# Patient Record
Sex: Female | Born: 1955 | Race: White | Hispanic: No | Marital: Married | Smoking: Never smoker
Health system: Southern US, Community
[De-identification: ages and names within clinical notes are randomized; demographics above are authoritative.]

## PROBLEM LIST (undated history)

## (undated) DIAGNOSIS — C5701 Malignant neoplasm of right fallopian tube: Secondary | ICD-10-CM

## (undated) DIAGNOSIS — M549 Dorsalgia, unspecified: Secondary | ICD-10-CM

## (undated) DIAGNOSIS — M2550 Pain in unspecified joint: Secondary | ICD-10-CM

## (undated) DIAGNOSIS — I10 Essential (primary) hypertension: Secondary | ICD-10-CM

## (undated) DIAGNOSIS — R600 Localized edema: Secondary | ICD-10-CM

## (undated) HISTORY — DX: Essential (primary) hypertension: I10

## (undated) HISTORY — DX: Malignant neoplasm of right fallopian tube: C57.01

## (undated) HISTORY — DX: Localized edema: R60.0

## (undated) HISTORY — DX: Pain in unspecified joint: M25.50

## (undated) HISTORY — PX: ORTHOPEDIC SURGERY: SHX850

## (undated) HISTORY — DX: Dorsalgia, unspecified: M54.9

---

## 1999-03-17 ENCOUNTER — Other Ambulatory Visit: Admission: RE | Admit: 1999-03-17 | Discharge: 1999-03-17 | Payer: Self-pay | Admitting: Obstetrics and Gynecology

## 2000-06-29 ENCOUNTER — Encounter: Payer: Self-pay | Admitting: Preventative Medicine

## 2000-06-29 ENCOUNTER — Ambulatory Visit (HOSPITAL_COMMUNITY): Admission: RE | Admit: 2000-06-29 | Discharge: 2000-06-29 | Payer: Self-pay | Admitting: Preventative Medicine

## 2000-11-08 ENCOUNTER — Other Ambulatory Visit: Admission: RE | Admit: 2000-11-08 | Discharge: 2000-11-08 | Payer: Self-pay | Admitting: Obstetrics and Gynecology

## 2003-03-07 ENCOUNTER — Ambulatory Visit (HOSPITAL_BASED_OUTPATIENT_CLINIC_OR_DEPARTMENT_OTHER): Admission: RE | Admit: 2003-03-07 | Discharge: 2003-03-07 | Payer: Self-pay | Admitting: Orthopedic Surgery

## 2003-03-19 ENCOUNTER — Encounter (INDEPENDENT_AMBULATORY_CARE_PROVIDER_SITE_OTHER): Payer: Self-pay | Admitting: Specialist

## 2003-03-19 ENCOUNTER — Ambulatory Visit (HOSPITAL_COMMUNITY): Admission: RE | Admit: 2003-03-19 | Discharge: 2003-03-19 | Payer: Self-pay | Admitting: Gastroenterology

## 2003-04-06 ENCOUNTER — Encounter: Payer: Self-pay | Admitting: Gastroenterology

## 2003-04-06 ENCOUNTER — Encounter: Admission: RE | Admit: 2003-04-06 | Discharge: 2003-04-06 | Payer: Self-pay | Admitting: Gastroenterology

## 2004-02-11 ENCOUNTER — Ambulatory Visit (HOSPITAL_COMMUNITY): Admission: RE | Admit: 2004-02-11 | Discharge: 2004-02-11 | Payer: Self-pay | Admitting: Family Medicine

## 2004-06-17 ENCOUNTER — Ambulatory Visit (HOSPITAL_COMMUNITY): Admission: RE | Admit: 2004-06-17 | Discharge: 2004-06-17 | Payer: Self-pay | Admitting: Obstetrics and Gynecology

## 2004-06-17 ENCOUNTER — Encounter (INDEPENDENT_AMBULATORY_CARE_PROVIDER_SITE_OTHER): Payer: Self-pay | Admitting: Specialist

## 2004-07-01 ENCOUNTER — Ambulatory Visit: Admission: RE | Admit: 2004-07-01 | Discharge: 2004-07-01 | Payer: Self-pay | Admitting: Gynecologic Oncology

## 2004-07-15 ENCOUNTER — Encounter (INDEPENDENT_AMBULATORY_CARE_PROVIDER_SITE_OTHER): Payer: Self-pay | Admitting: Specialist

## 2004-07-15 ENCOUNTER — Encounter (INDEPENDENT_AMBULATORY_CARE_PROVIDER_SITE_OTHER): Payer: Self-pay | Admitting: *Deleted

## 2004-07-15 ENCOUNTER — Inpatient Hospital Stay (HOSPITAL_COMMUNITY): Admission: RE | Admit: 2004-07-15 | Discharge: 2004-07-18 | Payer: Self-pay | Admitting: Obstetrics and Gynecology

## 2004-07-15 DIAGNOSIS — C5701 Malignant neoplasm of right fallopian tube: Secondary | ICD-10-CM

## 2004-07-15 HISTORY — PX: ABDOMINAL HYSTERECTOMY: SHX81

## 2004-07-15 HISTORY — DX: Malignant neoplasm of right fallopian tube: C57.01

## 2004-07-17 ENCOUNTER — Ambulatory Visit: Payer: Self-pay | Admitting: Oncology

## 2004-07-29 ENCOUNTER — Ambulatory Visit: Admission: RE | Admit: 2004-07-29 | Discharge: 2004-07-29 | Payer: Self-pay | Admitting: Gynecology

## 2004-08-01 ENCOUNTER — Inpatient Hospital Stay (HOSPITAL_COMMUNITY): Admission: AD | Admit: 2004-08-01 | Discharge: 2004-08-01 | Payer: Self-pay | Admitting: Obstetrics & Gynecology

## 2004-08-05 ENCOUNTER — Ambulatory Visit: Admission: RE | Admit: 2004-08-05 | Discharge: 2004-08-05 | Payer: Self-pay | Admitting: Gynecologic Oncology

## 2004-08-07 ENCOUNTER — Encounter (INDEPENDENT_AMBULATORY_CARE_PROVIDER_SITE_OTHER): Payer: Self-pay | Admitting: *Deleted

## 2004-08-07 ENCOUNTER — Ambulatory Visit (HOSPITAL_COMMUNITY): Admission: RE | Admit: 2004-08-07 | Discharge: 2004-08-07 | Payer: Self-pay | Admitting: Gynecology

## 2004-08-08 ENCOUNTER — Ambulatory Visit (HOSPITAL_COMMUNITY): Admission: RE | Admit: 2004-08-08 | Discharge: 2004-08-08 | Payer: Self-pay | Admitting: Gynecologic Oncology

## 2004-08-11 ENCOUNTER — Ambulatory Visit (HOSPITAL_COMMUNITY): Admission: RE | Admit: 2004-08-11 | Discharge: 2004-08-11 | Payer: Self-pay | Admitting: Gynecologic Oncology

## 2004-08-29 ENCOUNTER — Ambulatory Visit (HOSPITAL_COMMUNITY): Admission: RE | Admit: 2004-08-29 | Discharge: 2004-08-30 | Payer: Self-pay | Admitting: Oncology

## 2004-08-29 ENCOUNTER — Ambulatory Visit: Payer: Self-pay | Admitting: Oncology

## 2004-09-02 ENCOUNTER — Ambulatory Visit: Payer: Self-pay | Admitting: Oncology

## 2004-10-20 ENCOUNTER — Ambulatory Visit: Payer: Self-pay | Admitting: Oncology

## 2004-12-16 ENCOUNTER — Ambulatory Visit: Payer: Self-pay | Admitting: Oncology

## 2005-01-08 ENCOUNTER — Encounter: Admission: RE | Admit: 2005-01-08 | Discharge: 2005-01-08 | Payer: Self-pay | Admitting: Oncology

## 2005-01-13 ENCOUNTER — Ambulatory Visit: Admission: RE | Admit: 2005-01-13 | Discharge: 2005-01-13 | Payer: Self-pay | Admitting: Gynecologic Oncology

## 2005-01-15 ENCOUNTER — Ambulatory Visit (HOSPITAL_COMMUNITY): Admission: RE | Admit: 2005-01-15 | Discharge: 2005-01-15 | Payer: Self-pay | Admitting: Gynecologic Oncology

## 2005-03-24 ENCOUNTER — Ambulatory Visit (HOSPITAL_COMMUNITY): Admission: RE | Admit: 2005-03-24 | Discharge: 2005-03-24 | Payer: Self-pay | Admitting: Oncology

## 2005-03-24 ENCOUNTER — Ambulatory Visit: Payer: Self-pay | Admitting: Oncology

## 2005-03-31 ENCOUNTER — Ambulatory Visit: Admission: RE | Admit: 2005-03-31 | Discharge: 2005-03-31 | Payer: Self-pay | Admitting: Gynecologic Oncology

## 2005-07-14 ENCOUNTER — Ambulatory Visit: Payer: Self-pay | Admitting: Oncology

## 2005-10-23 ENCOUNTER — Ambulatory Visit: Payer: Self-pay | Admitting: Oncology

## 2005-11-13 ENCOUNTER — Ambulatory Visit: Admission: RE | Admit: 2005-11-13 | Discharge: 2005-11-13 | Payer: Self-pay | Admitting: Gynecology

## 2006-01-18 ENCOUNTER — Ambulatory Visit: Payer: Self-pay | Admitting: Oncology

## 2006-01-18 LAB — CBC WITH DIFFERENTIAL/PLATELET
Eosinophils Absolute: 0.1 10*3/uL (ref 0.0–0.5)
HCT: 39.7 % (ref 34.8–46.6)
LYMPH%: 28.6 % (ref 14.0–48.0)
MONO#: 0.4 10*3/uL (ref 0.1–0.9)
NEUT#: 2.8 10*3/uL (ref 1.5–6.5)
NEUT%: 61.2 % (ref 39.6–76.8)
Platelets: 222 10*3/uL (ref 145–400)
WBC: 4.6 10*3/uL (ref 3.9–10.0)

## 2006-01-18 LAB — COMPREHENSIVE METABOLIC PANEL
ALT: 32 U/L (ref 0–40)
BUN: 14 mg/dL (ref 6–23)
CO2: 27 mEq/L (ref 19–32)
Calcium: 9.5 mg/dL (ref 8.4–10.5)
Chloride: 105 mEq/L (ref 96–112)
Creatinine, Ser: 0.7 mg/dL (ref 0.4–1.2)

## 2006-01-18 LAB — LIPID PANEL
Cholesterol: 157 mg/dL (ref 0–200)
HDL: 39 mg/dL — ABNORMAL LOW (ref >39)
Total CHOL/HDL Ratio: 4 Ratio

## 2006-04-07 ENCOUNTER — Ambulatory Visit: Payer: Self-pay | Admitting: Oncology

## 2006-04-13 ENCOUNTER — Ambulatory Visit: Admission: RE | Admit: 2006-04-13 | Discharge: 2006-04-13 | Payer: Self-pay | Admitting: Gynecologic Oncology

## 2006-07-30 ENCOUNTER — Ambulatory Visit: Payer: Self-pay | Admitting: Oncology

## 2006-08-03 LAB — COMPREHENSIVE METABOLIC PANEL
AST: 14 U/L (ref 0–37)
Albumin: 4.6 g/dL (ref 3.5–5.2)
Alkaline Phosphatase: 104 U/L (ref 39–117)
BUN: 15 mg/dL (ref 6–23)
Glucose, Bld: 99 mg/dL (ref 70–99)
Potassium: 3.8 mEq/L (ref 3.5–5.3)
Sodium: 142 mEq/L (ref 135–145)
Total Bilirubin: 0.7 mg/dL (ref 0.3–1.2)
Total Protein: 7.5 g/dL (ref 6.0–8.3)

## 2006-08-03 LAB — CBC WITH DIFFERENTIAL/PLATELET
EOS%: 0.8 % (ref 0.0–7.0)
LYMPH%: 20 % (ref 14.0–48.0)
MCH: 30.8 pg (ref 26.0–34.0)
MCV: 88.4 fL (ref 81.0–101.0)
MONO%: 6.2 % (ref 0.0–13.0)
Platelets: 266 10*3/uL (ref 145–400)
RBC: 4.63 10*6/uL (ref 3.70–5.32)
RDW: 12.1 % (ref 11.3–14.5)

## 2006-11-22 ENCOUNTER — Ambulatory Visit: Payer: Self-pay | Admitting: Oncology

## 2006-11-24 LAB — COMPREHENSIVE METABOLIC PANEL
AST: 15 U/L (ref 0–37)
Albumin: 4.3 g/dL (ref 3.5–5.2)
Alkaline Phosphatase: 100 U/L (ref 39–117)
CO2: 30 mEq/L (ref 19–32)
Calcium: 9.4 mg/dL (ref 8.4–10.5)
Total Protein: 7 g/dL (ref 6.0–8.3)

## 2006-11-24 LAB — CBC WITH DIFFERENTIAL/PLATELET
BASO%: 0.5 % (ref 0.0–2.0)
Basophils Absolute: 0 10*3/uL (ref 0.0–0.1)
HCT: 38.4 % (ref 34.8–46.6)
LYMPH%: 22.6 % (ref 14.0–48.0)
MCH: 30.5 pg (ref 26.0–34.0)
MCHC: 34.9 g/dL (ref 32.0–36.0)
MONO#: 0.5 10*3/uL (ref 0.1–0.9)
NEUT%: 68.2 % (ref 39.6–76.8)
Platelets: 256 10*3/uL (ref 145–400)
WBC: 6.3 10*3/uL (ref 3.9–10.0)

## 2006-12-01 ENCOUNTER — Encounter: Admission: RE | Admit: 2006-12-01 | Discharge: 2006-12-01 | Payer: Self-pay | Admitting: Oncology

## 2006-12-21 ENCOUNTER — Ambulatory Visit: Admission: RE | Admit: 2006-12-21 | Discharge: 2006-12-21 | Payer: Self-pay | Admitting: Gynecologic Oncology

## 2007-04-08 ENCOUNTER — Ambulatory Visit: Payer: Self-pay | Admitting: Oncology

## 2007-04-12 LAB — COMPREHENSIVE METABOLIC PANEL
ALT: 13 U/L (ref 0–35)
AST: 16 U/L (ref 0–37)
Alkaline Phosphatase: 97 U/L (ref 39–117)
Glucose, Bld: 85 mg/dL (ref 70–99)
Sodium: 138 mEq/L (ref 135–145)
Total Bilirubin: 0.5 mg/dL (ref 0.3–1.2)
Total Protein: 7.1 g/dL (ref 6.0–8.3)

## 2007-04-12 LAB — CBC WITH DIFFERENTIAL/PLATELET
BASO%: 0.5 % (ref 0.0–2.0)
EOS%: 1.2 % (ref 0.0–7.0)
LYMPH%: 24 % (ref 14.0–48.0)
MCHC: 35.2 g/dL (ref 32.0–36.0)
MCV: 86.3 fL (ref 81.0–101.0)
MONO%: 7.5 % (ref 0.0–13.0)
Platelets: 245 10*3/uL (ref 145–400)
RBC: 4.61 10*6/uL (ref 3.70–5.32)

## 2007-06-30 ENCOUNTER — Ambulatory Visit: Payer: Self-pay | Admitting: Oncology

## 2007-07-04 LAB — CA 125: CA 125: 14.2 U/mL (ref 0.0–30.2)

## 2007-07-19 ENCOUNTER — Ambulatory Visit: Admission: RE | Admit: 2007-07-19 | Discharge: 2007-07-19 | Payer: Self-pay | Admitting: Gynecologic Oncology

## 2008-01-09 ENCOUNTER — Ambulatory Visit: Payer: Self-pay | Admitting: Oncology

## 2008-01-11 LAB — COMPREHENSIVE METABOLIC PANEL
ALT: 14 U/L (ref 0–35)
AST: 17 U/L (ref 0–37)
Albumin: 4.2 g/dL (ref 3.5–5.2)
Alkaline Phosphatase: 102 U/L (ref 39–117)
BUN: 14 mg/dL (ref 6–23)
Calcium: 9.3 mg/dL (ref 8.4–10.5)
Chloride: 104 mEq/L (ref 96–112)
Potassium: 3.9 mEq/L (ref 3.5–5.3)
Sodium: 140 mEq/L (ref 135–145)
Total Protein: 7.2 g/dL (ref 6.0–8.3)

## 2008-01-11 LAB — CBC WITH DIFFERENTIAL/PLATELET
BASO%: 1.4 % (ref 0.0–2.0)
Basophils Absolute: 0.1 10*3/uL (ref 0.0–0.1)
EOS%: 1.4 % (ref 0.0–7.0)
HGB: 13.9 g/dL (ref 11.6–15.9)
MCH: 29.8 pg (ref 26.0–34.0)
RBC: 4.68 10*6/uL (ref 3.70–5.32)
RDW: 13 % (ref 11.3–14.5)
lymph#: 1.4 10*3/uL (ref 0.9–3.3)

## 2008-02-09 ENCOUNTER — Encounter: Admission: RE | Admit: 2008-02-09 | Discharge: 2008-02-09 | Payer: Self-pay | Admitting: Oncology

## 2008-06-05 ENCOUNTER — Ambulatory Visit: Payer: Self-pay | Admitting: Oncology

## 2008-06-05 LAB — CBC WITH DIFFERENTIAL/PLATELET
Eosinophils Absolute: 0.1 10*3/uL (ref 0.0–0.5)
HCT: 39.1 % (ref 34.8–46.6)
HGB: 13.3 g/dL (ref 11.6–15.9)
LYMPH%: 30.9 % (ref 14.0–48.0)
MONO#: 0.4 10*3/uL (ref 0.1–0.9)
NEUT#: 3.3 10*3/uL (ref 1.5–6.5)
NEUT%: 60.6 % (ref 39.6–76.8)
Platelets: 213 10*3/uL (ref 145–400)
RBC: 4.48 10*6/uL (ref 3.70–5.32)
WBC: 5.5 10*3/uL (ref 3.9–10.0)

## 2008-06-05 LAB — COMPREHENSIVE METABOLIC PANEL
CO2: 23 mEq/L (ref 19–32)
Glucose, Bld: 114 mg/dL — ABNORMAL HIGH (ref 70–99)
Sodium: 140 mEq/L (ref 135–145)
Total Bilirubin: 0.5 mg/dL (ref 0.3–1.2)
Total Protein: 7.2 g/dL (ref 6.0–8.3)

## 2008-06-05 LAB — CA 125: CA 125: 13 U/mL (ref 0.0–30.2)

## 2008-07-18 ENCOUNTER — Ambulatory Visit: Admission: RE | Admit: 2008-07-18 | Discharge: 2008-07-18 | Payer: Self-pay | Admitting: Gynecologic Oncology

## 2008-07-24 ENCOUNTER — Ambulatory Visit: Payer: Self-pay | Admitting: Oncology

## 2009-01-16 ENCOUNTER — Ambulatory Visit: Payer: Self-pay | Admitting: Oncology

## 2009-01-18 LAB — COMPREHENSIVE METABOLIC PANEL
Alkaline Phosphatase: 101 U/L (ref 39–117)
BUN: 11 mg/dL (ref 6–23)
Glucose, Bld: 116 mg/dL — ABNORMAL HIGH (ref 70–99)
Total Bilirubin: 0.5 mg/dL (ref 0.3–1.2)

## 2009-01-18 LAB — CBC WITH DIFFERENTIAL/PLATELET
Basophils Absolute: 0 10*3/uL (ref 0.0–0.1)
EOS%: 1 % (ref 0.0–7.0)
Eosinophils Absolute: 0.1 10*3/uL (ref 0.0–0.5)
HGB: 13.1 g/dL (ref 11.6–15.9)
MCH: 29.6 pg (ref 25.1–34.0)
NEUT#: 3.5 10*3/uL (ref 1.5–6.5)
RDW: 12.3 % (ref 11.2–14.5)
lymph#: 1.3 10*3/uL (ref 0.9–3.3)

## 2009-02-11 ENCOUNTER — Encounter: Admission: RE | Admit: 2009-02-11 | Discharge: 2009-02-11 | Payer: Self-pay | Admitting: Family Medicine

## 2009-06-15 IMAGING — CT CT UROGRAM
1 series · 15 of 32 positions shown, 19 images · non-contrast
Comparison: NONE

CLINICAL DATA: Hematuria and dysuria. 

CT UROGRAM
TECHNIQUE: Thin-section unenhanced axial images were obtained to 
provide a CT urogram to evaluate for possible urinary tract stone. 
 Scan thicknesses were 3.0 mm with 3.0-mm increments.

[Series 2: wo · axial · 0.79mm/px · z∈[-144,+282]mm · 15 of 159 slices shown, 19 images]
[im 11/159  soft-tissue]
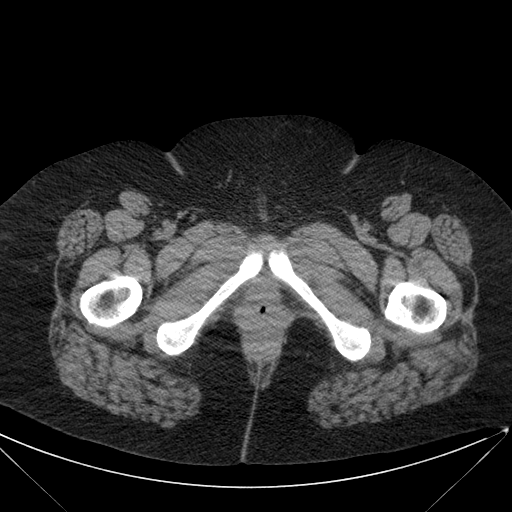
[im 11/159  bone]
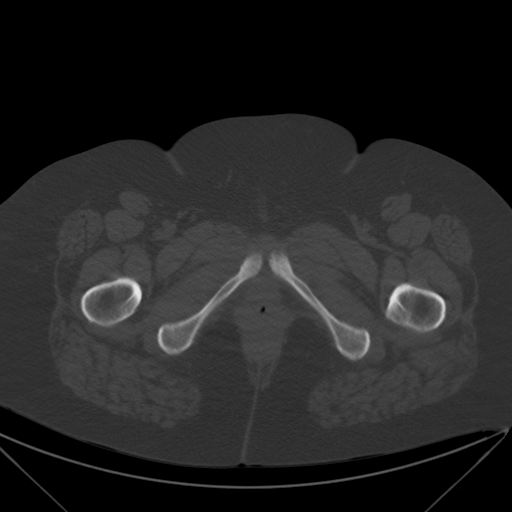
[im 21/159  soft-tissue]
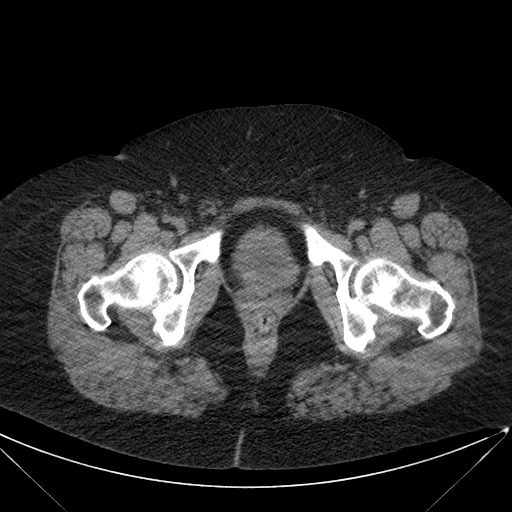
[im 31/159  soft-tissue]
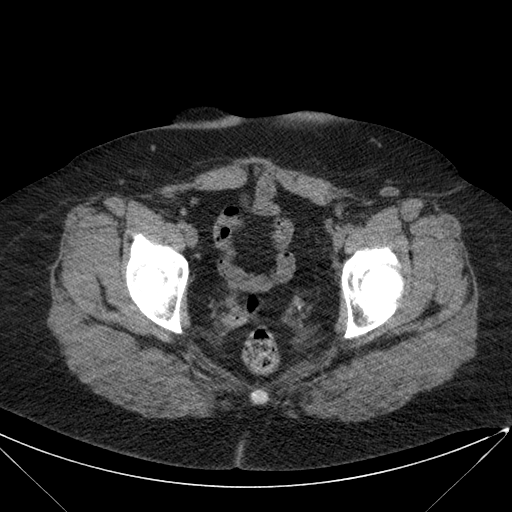
[im 46/159  soft-tissue]
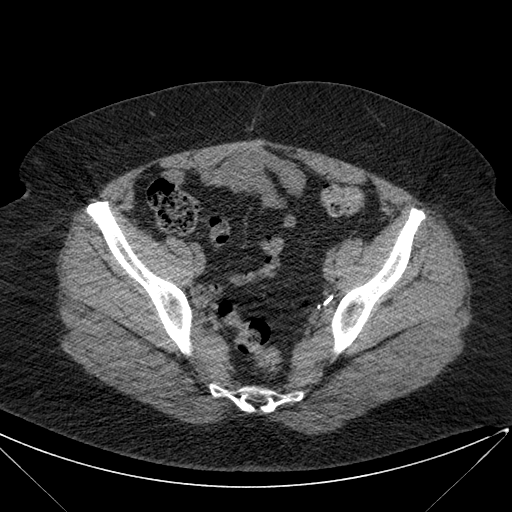
[im 57/159  soft-tissue]
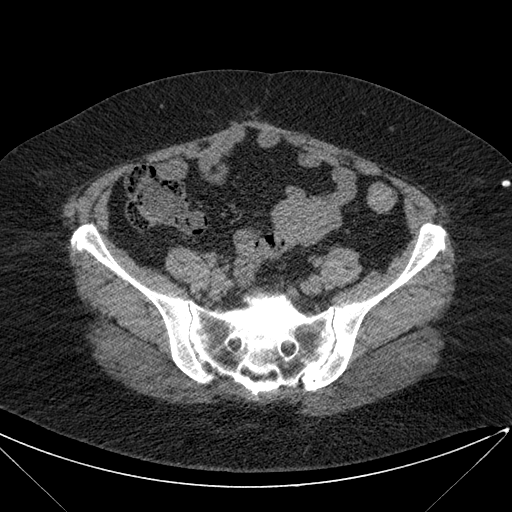
[im 67/159  soft-tissue]
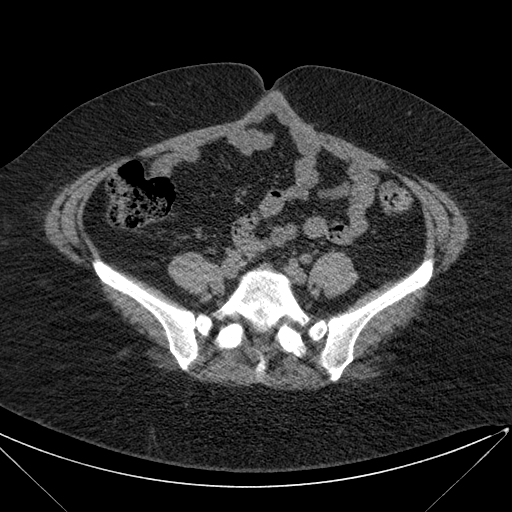
[im 82/159  soft-tissue]
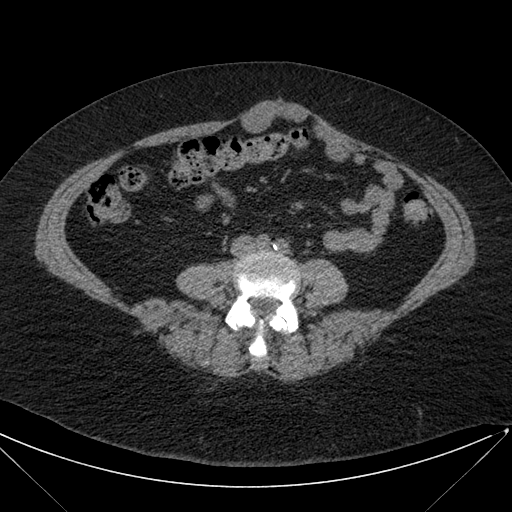
[im 92/159  soft-tissue]
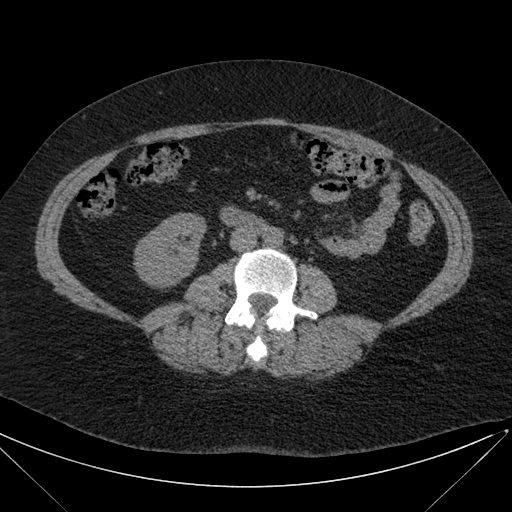
[im 102/159  soft-tissue]
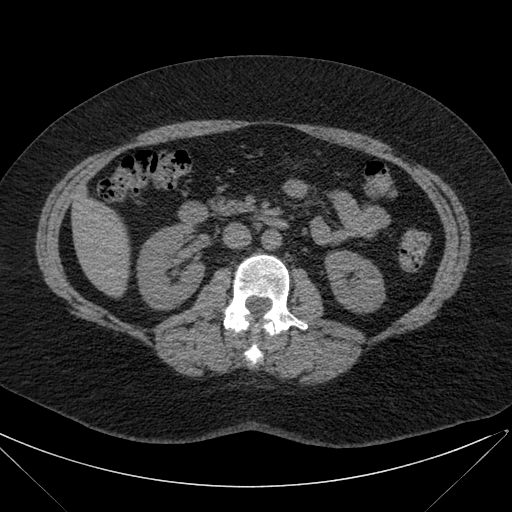
[im 102/159  bone]
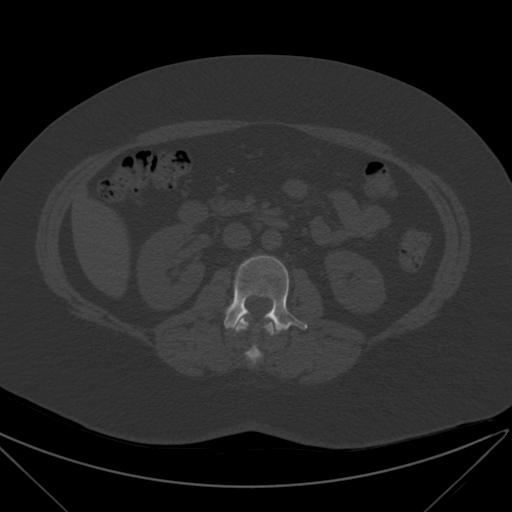
[im 113/159  soft-tissue]
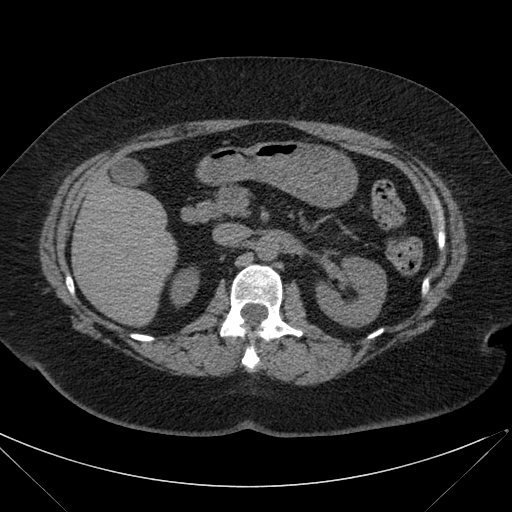
[im 128/159  soft-tissue]
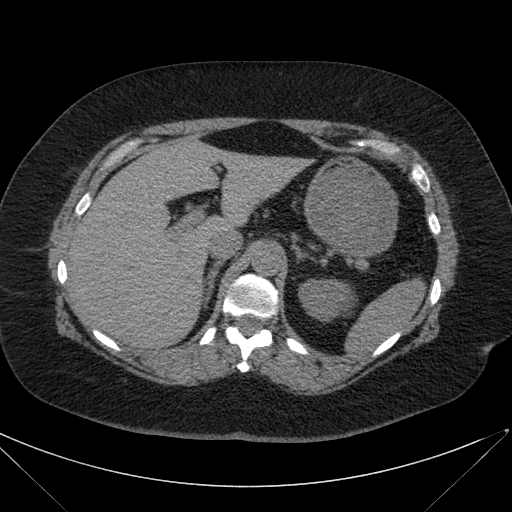
[im 138/159  soft-tissue]
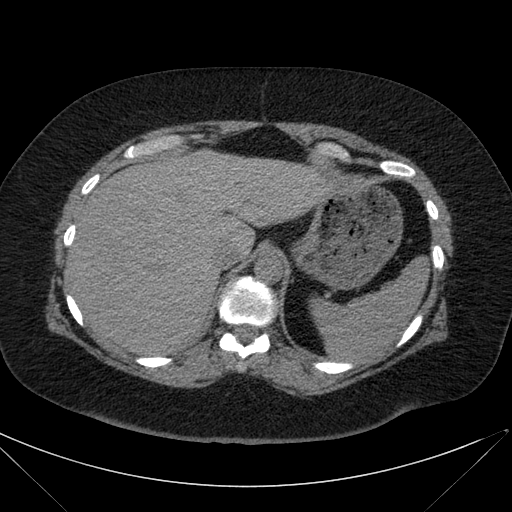
[im 138/159  lung]
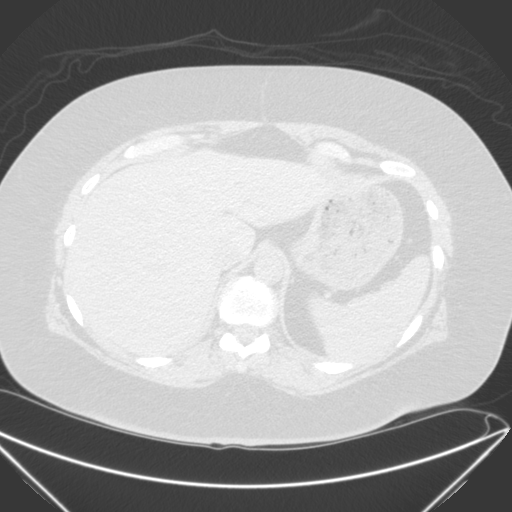
[im 143/159  lung]
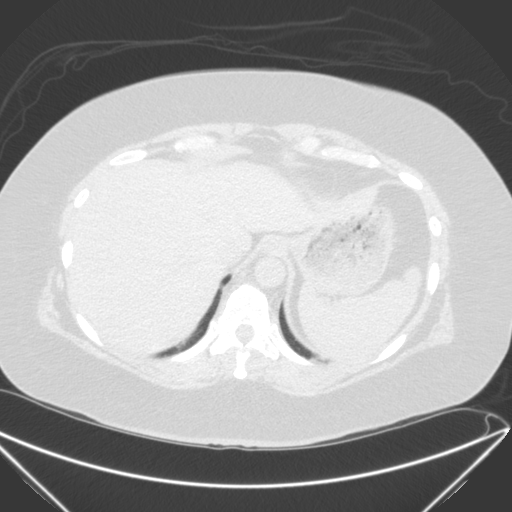
[im 148/159  soft-tissue]
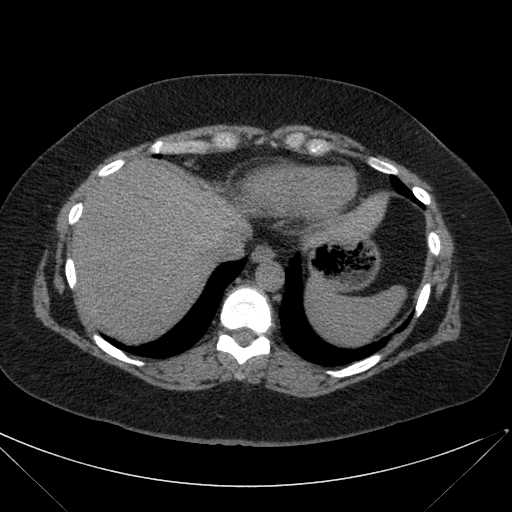
[im 148/159  lung]
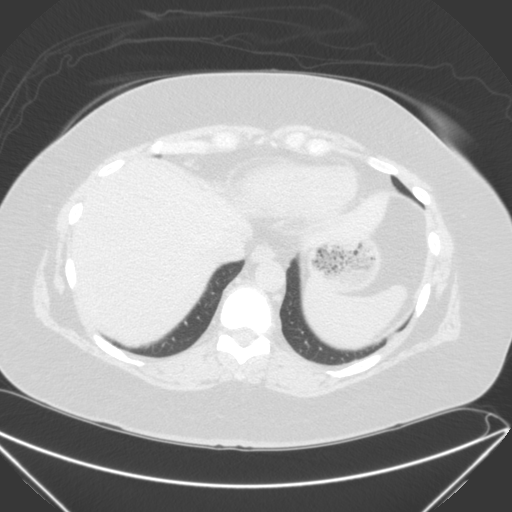
[im 153/159  lung]
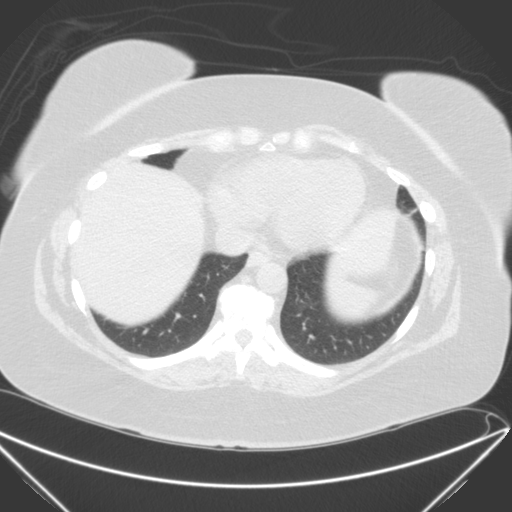

[15 of 32 positions shown; findings below may reference images not displayed]

FINDINGS: No renal or ureteral calculi or evidence of 
obstruction. No gallstones are identified. The liver, pancreas, 
spleen, and right kidney are unremarkable.  No upper abdominal or 
retroperitoneal mass, adenopathy, or aneurysm.  There is an area 
of abnormal radiolucency in the upper pole of the left kidney, 
indeterminate, for which post-IV-contrast imaging will be 
required. No evidence of appendicitis, diverticulitis, hernia, or 
bowel obstruction.  No lung base mass, infiltrate, edema, or 
effusion.  No lytic or blastic lesions.
IMPRESSION: No renal or ureteral calculi or evidence of renal 
obstruction. Question left upper pole renal mass.  Recommend CT of 
the abdomen and pelvis following intravenous contrast for further 
evaluation. Daigo, Kotoku electronically reviewed on 
07/26/2007 Dict Date: 07/26/2007  Tran Date:  07/26/2007 DAS  JLM

## 2009-06-24 ENCOUNTER — Ambulatory Visit: Payer: Self-pay | Admitting: Oncology

## 2009-06-26 LAB — CA 125: CA 125: 13 U/mL (ref 0.0–30.2)

## 2009-07-03 ENCOUNTER — Ambulatory Visit: Admission: RE | Admit: 2009-07-03 | Discharge: 2009-07-03 | Payer: Self-pay | Admitting: Gynecologic Oncology

## 2010-01-16 ENCOUNTER — Ambulatory Visit: Payer: Self-pay | Admitting: Oncology

## 2010-01-17 LAB — COMPREHENSIVE METABOLIC PANEL
ALT: 21 U/L (ref 0–35)
AST: 23 U/L (ref 0–37)
Albumin: 4.2 g/dL (ref 3.5–5.2)
Alkaline Phosphatase: 110 U/L (ref 39–117)
BUN: 13 mg/dL (ref 6–23)
CO2: 23 mEq/L (ref 19–32)
Calcium: 9.4 mg/dL (ref 8.4–10.5)
Chloride: 102 mEq/L (ref 96–112)
Creatinine, Ser: 0.73 mg/dL (ref 0.40–1.20)
Glucose, Bld: 99 mg/dL (ref 70–99)
Potassium: 4.5 mEq/L (ref 3.5–5.3)
Sodium: 140 mEq/L (ref 135–145)
Total Bilirubin: 0.6 mg/dL (ref 0.3–1.2)
Total Protein: 7.3 g/dL (ref 6.0–8.3)

## 2010-01-17 LAB — CBC WITH DIFFERENTIAL/PLATELET
BASO%: 0.8 % (ref 0.0–2.0)
Basophils Absolute: 0.1 10*3/uL (ref 0.0–0.1)
EOS%: 1.2 % (ref 0.0–7.0)
Eosinophils Absolute: 0.1 10*3/uL (ref 0.0–0.5)
HCT: 40.2 % (ref 34.8–46.6)
HGB: 13.8 g/dL (ref 11.6–15.9)
LYMPH%: 29.3 % (ref 14.0–49.7)
MCH: 29.7 pg (ref 25.1–34.0)
MCHC: 34.4 g/dL (ref 31.5–36.0)
MCV: 86.4 fL (ref 79.5–101.0)
MONO#: 0.6 10*3/uL (ref 0.1–0.9)
MONO%: 8.4 % (ref 0.0–14.0)
NEUT#: 4.1 10*3/uL (ref 1.5–6.5)
NEUT%: 60.3 % (ref 38.4–76.8)
Platelets: 237 10*3/uL (ref 145–400)
RBC: 4.65 10*6/uL (ref 3.70–5.45)
RDW: 13 % (ref 11.2–14.5)
WBC: 6.8 10*3/uL (ref 3.9–10.3)
lymph#: 2 10*3/uL (ref 0.9–3.3)

## 2010-01-17 LAB — CA 125: CA 125: 14.1 U/mL (ref 0.0–30.2)

## 2010-05-19 ENCOUNTER — Ambulatory Visit (HOSPITAL_BASED_OUTPATIENT_CLINIC_OR_DEPARTMENT_OTHER): Admission: RE | Admit: 2010-05-19 | Discharge: 2010-05-19 | Payer: Self-pay | Admitting: Orthopedic Surgery

## 2010-09-21 ENCOUNTER — Encounter: Payer: Self-pay | Admitting: Family Medicine

## 2010-09-21 ENCOUNTER — Encounter: Payer: Self-pay | Admitting: Oncology

## 2010-11-13 LAB — POCT HEMOGLOBIN-HEMACUE: Hemoglobin: 14.8 g/dL (ref 12.0–15.0)

## 2011-01-13 NOTE — Consult Note (Signed)
NAME:  Gail Crawford, Gail Crawford NO.:  [id number]   MEDICAL RECORD NO.:  [id number]          PATIENT TYPE:  OUT   LOCATION:  GYN                          FACILITY:  [facility]   PHYSICIAN:  John T. Kyla Balzarine, M.D.    DATE OF BIRTH:  [birth year]   DATE OF CONSULTATION:  DATE OF DISCHARGE:                                 CONSULTATION   CHIEF COMPLAINT:  Follow-up of tubal carcinoma.   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  CA-125 values have  remained in the mid teens since completion of treatment and she has in  general done quite well.  Over the past 6 months, however, she has had  central back pain and abdominal pain, left lower quadrant greater than  right, exacerbated by activity and with no associated change in bowel or  bladder function.  She is evaluated with a negative CT scan in 01/2008,  which did show some degenerative disk disease with no evidence of  obstruction, carcinomatosis, lymphadenopathy or ascites.  Colonoscopy  was subsequently negative.  She was treated with anti-inflammatory  medications with minimal change and over the past several weeks has been  seeing a [place].  She reports marked improvement in her symptoms  since undergoing chiropractic manipulation.  She notes no early satiety,  GERD symptoms.  Bowel function and bladder function is normal.  She has  no pelvic pain or leg edema.  Her functional capacity is improving and  neuropathy has improved in general.   Past medical history, personal and social history, family history  reviewed and unchanged.   CURRENT MEDICATIONS:  Over-the-counter analgesics.   ALLERGIES:  None.   REVIEW OF SYSTEMS:  Other than specifically noted above, negative 10-  point system review.   PHYSICAL EXAMINATION:  VITAL SIGNS:  Weight 252 pounds (stable).  GENERAL:  The  patient is anxious, alert and oriented x3 in no acute  distress.  Pain score today is zero.  LYMPH NODE:  Survey negative for pathologic lymphadenopathy.  There is  no spinous or CVA tenderness.  ABDOMEN:  Obese, soft and benign.  There is no hernia, ascites, mass or  organomegaly.  There is minimal tenderness to deep palpation in the left  lower quadrant with no peritoneal signs.  EXTREMITIES:  Have full strength and range of motion with no edema,  cords or Homan's.  PELVIC:  External genitalia and BUS, bladder and urethra and vagina are  clear.  The bimanual and rectovaginal examinations disclose absent  uterus and cervix.  No mass or nodularity and no tenderness.   ASSESSMENT:  Tubal carcinoma, clinically NAD with CA-125 value in  October, stable at 13.0.   PLAN:  Continue monitoring at 13-month intervals; she will see Dr.  Darrold Span for follow-up in approximately 6 months and Dr. Darrold Span to set  up a CT scan.  She knows to contact our office for an earlier  appointment should she have refractory abdominal pain or other symptoms.  John T. Kyla Balzarine, M.D.  Electronically Signed     JTS/MEDQ  D:  07/18/2008  T:  07/18/2008  Job:  [id number]   cc:   Lennis P. Darrold Span, M.D.  Fax: [phone]   Telford Nab, R.N.  [address]  [place], [place] [address]   Tammy R. Collins Scotland, M.D.  Fax: [phone]   Melvyn Novas, MD  Fax: [phone]

## 2011-01-13 NOTE — Consult Note (Signed)
NAME:  Gail Crawford, Gail Crawford NO.:  [id number]   MEDICAL RECORD NO.:  [id number]          PATIENT TYPE:  OUT   LOCATION:  GYN                          FACILITY:  [facility]   PHYSICIAN:  John T. Kyla Balzarine, M.D.    DATE OF BIRTH:  1956/04/05   DATE OF CONSULTATION:  07/19/2007  DATE OF DISCHARGE:                                 CONSULTATION   GYN ONCOLOGY CONSULTATION   CHIEF COMPLAINT:  Followup tubal cancer, stage IIIC.   HISTORY OF PRESENT ILLNESS:  This patient had stage IIIC tubal carcinoma  diagnosed in November, 2005.  She underwent completion debulking surgery  and 6 cycles of adjuvant Taxol and carboplatin, completed in April,  2006.  We have been alternating visits between our service and Dr.  Jama Flavors.  She states that she has done quite well and denies  current hot flashes.  She relates no early satiety, back or abdominal  pain, change in bowel function or bowel habits.   PAST HISTORY/PERSONAL SOCIAL HISTORY/FAMILY HISTORY:  Are unchanged from  prior encounter.   MEDICATIONS:  She is on no new medications.   PHYSICAL EXAMINATION:  VITAL SIGNS:  Weight 258 pounds.  Blood pressure  110/78.  Temperature afebrile.  GENERAL:  Patient is alert and oriented x3 and in no acute distress.  MUSCULOSKELETAL:  There is no back or costovertebral angle tenderness.  ABDOMEN:  Soft, benign with a well-healed incision.  No hernia, ascites  or mass.  EXTREMITIES:  Full strength and range of motion throughout.  PELVIC:  External genitalia, BUS, bladder, urethra and vagina are clear  with vaginal atrophy.  Bimanual exam and rectovaginal examination  discloses no masses or nodularity.   ASSESSMENT:  Stage IIIC tubal carcinoma, clinically NAD.   PLAN:  At this juncture, we will see the patient back in six months and  will obtain abdominal/pelvic CT scan; if clear, she could be seen at  annual intervals, alternating every six month visits between our service  and Dr.  Darrold Span.      John T. Kyla Balzarine, M.D.  Electronically Signed     JTS/MEDQ  D:  07/19/2007  T:  07/20/2007  Job:  [id number]   cc:   Lennis P. Darrold Span, M.D.  Fax: [phone]   Melvyn Novas, MD  Fax: [phone]   Tammy R. Collins Scotland, M.D.  Fax: [phone]   Telford Nab, R.N.  [address]  [place], [place] [address]

## 2011-01-16 NOTE — H&P (Signed)
NAME:  Gail Crawford, Gail Crawford NO.:  [id number]   MEDICAL RECORD NO.:  [id number]          PATIENT TYPE:  MAT   LOCATION:  MATC                          FACILITY:  [facility]   PHYSICIAN:  Gerri Spore B. Earlene Plater, M.D.  DATE OF BIRTH:  25-Dec-1955   DATE OF ADMISSION:  08/01/2004  DATE OF DISCHARGE:                                HISTORY & PHYSICAL   CHIEF COMPLAINT:  Left lower quadrant pain and nausea.   HISTORY OF PRESENT ILLNESS:  A 55 year old white female status post  exploratory laparotomy, TAH/BSO for a fallopian tube cancer which was  advanced stage. Over the last 24 hours, the patient has had development of  left sided low back pain which seems to be increased with deep breaths.  Has  been passing gas and had a small bowel movement today. However, has had  associated nausea and vomiting over the last two days. This all began  shortly after starting an antibiotic for urinary tract infection.   PAST MEDICAL HISTORY:  Otherwise none.   ALLERGIES:  None.   MEDICATIONS:  Septra.   FAMILY HISTORY:  Noncontributory.   PHYSICAL EXAMINATION:  VITAL SIGNS:  Admission temperature of 98.9, pulse  115, respirations 22, blood pressure 115/63.  Discharge vitals, temperature  100.3, pulse 87, respirations 20, blood pressure 97/52.  GENERAL:  The patient is alert and oriented in no acute distress and states  she is feeling better after IV fluid and Zofran.  HEART:  Regular rate and rhythm.  LUNGS:  Clear to auscultation.  ABDOMEN:  Soft, active bowel sounds, nontender. The incision has an area  which is being treated with dressing changes and irrigation. It is about 1  cm in length and 2-3 cm in depth. This is removed and inspected. It does not  appear infected. It is repacked with iodoform gauze.   LABORATORY DATA:  Urinalysis showed no evidence of infection currently but  specific gravity was elevated at greater than 1.030 and 40 of ketones are  present. White count was slightly  elevated at 14,000 with normal  differential. Complete metabolic profile within normal limits.  KUB shows  bibasilar atelectasis with a normal bowel gas pattern.   The patient was administered IV Zofran and IV hydration and states she is  feeling much better.   ASSESSMENT:  Nausea and vomiting and low back pain possibly related to  recent urinary tract infection or mild ileus; however, the patient is  passing gas and there was no evidence of ileus on x-ray.  As the patient is  feeling better, we have decided to discharged her home this evening with a  prescription for Phenergan and instructed to start with a liquid diet and  advance as tolerated. The patient instructed to call back should she have  any recurrence or worsening of her symptoms.   DISCHARGE MEDICATIONS:  Phenergan 25 mg p.o. q.6 h. p.r.n. nausea and  vomiting.   FOLLOW UP:  Followup p.r.n. or with Dr Kyla Balzarine later next week.     Wesl   WBD/MEDQ  D:  08/01/2004  T:  08/01/2004  Job:  [id number]

## 2011-01-16 NOTE — Discharge Summary (Signed)
NAME:  Gail Crawford, Gail Crawford NO.:  [id number]   MEDICAL RECORD NO.:  [id number]          PATIENT TYPE:  INP   LOCATION:  0444                         FACILITY:  [facility]   PHYSICIAN:  Maxie Better, M.D.DATE OF BIRTH:  Aug 03, 1956   DATE OF ADMISSION:  07/15/2004  DATE OF DISCHARGE:  07/18/2004                                 DISCHARGE SUMMARY   ADMISSION DIAGNOSIS:  High-grade cyst or endocervical adenocarcinoma.   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   PROCEDURE:  Type 2 radical hysterectomy, bilateral salpingo-oophorectomy,  bilateral pelvic and aortic selective lymphadenectomy, appendectomy,  omentectomy performed by Dr. Jonny Ruiz T. Soper.   HISTORY OF PRESENT ILLNESS:  This is a 55 year old para 2 female with a  history of tubal ligation, who is now being admitted for surgical staging  secondary to the finding of high-grade serous adenocarcinoma on D&C and  hysteroscopic resection of lower uterine segment endometrial mass.  The  patient's history is notable for postcoital bleeding with pelvic pain and  intermenstrual bleeding.   HOSPITAL COURSE:  The patient was admitted to [facility], where  she underwent the above procedure.  Intraoperative frozen section was  performed with poorly differentiated carcinoma of the right fallopian tube  that was removed and a focus of high-grade carcinoma with superficial  invasion favoring serous carcinoma; these were the findings on frozen  section.  Please see the dictated operative report for detailed description  of the intraoperative findings by Dr. Kyla Balzarine.  Postoperatively, the patient  did well, afebrile.  The temperature max on a postop check was 100.5, which  was thought to be probably secondary to atelectasis; this fever subsequently  resolved.  The patient had bowel movements x2 on postop day #2; her diet was  therefore advanced.  Her incision, which was vertical, was well-  approximated.  There was some bruising in the lower aspect of the incision,  which was inspected, no findings to suggest infection at that time.  The  incision was ultimately probed for any possibility of a seroma; a slight  amount of serosanguineous fluid was noted.  By postop day #3, the patient  was ready for discharge; she was afebrile, she was tolerating a regular  diet, Foley catheter was removed and patient subsequently voided.  Her CBC  on postop day #1 showed a hemoglobin of 9.9, her preop hemoglobin was 11.7,  her white count was 17.7.  The final pathology report revealed fallopian  tube carcinoma metastatic to para-aortic lymph node and the superficial  surface of the endometrium.  These findings were discussed with the patient  by Dr. Calton Dach assistant and the arrangements were therefore made for the  patient to follow up with hematology-oncology, Dr. Ottie Glazier P. Darrold Span, for  further medical oncology management.  The patient was otherwise in good  spirits and coping with her new diagnosis, as expected.  She was deemed well  to be discharged home.   DISPOSITION:  Home.   CONDITION:  Stable.   DISCHARGE PLANS:  Discharge plans are for followup with  Dr. Kyla Balzarine on  August 05, 2004, Dr. Darrold Span on August 12, 2004, staple removal in Dr.  Maxie Better' office in a week's time from her surgery and follow up  postop with general in 4-6 weeks.   DISCHARGE MEDICATIONS:  1.  Tylox, #30, one p.o. q.4 h. p.r.n. pain.  2.  Vivelle-Dot patch 0.5 mg 1 q.wk.   DISCHARGE INSTRUCTIONS:  No straining with the bowel movements, nothing per  vagina for 4-6 weeks, no heavy lifting or driving for 2 weeks, call for  severe abdominal pain, nausea, vomiting, increased incisional pain, redness  or drainage from the incision site.      [place]/MEDQ  D:  07/31/2004  T:  07/31/2004  Job:  [id number]

## 2011-01-16 NOTE — Op Note (Signed)
NAME:  DAJIAH, Gail Crawford           ACCOUNT NO.:  [id number]   MEDICAL RECORD NO.:  [id number]          PATIENT TYPE:  INP   LOCATION:  0444                         [facility]ACILITY:  [facility]   PHYSICIAN:  John T. Kyla Balzarine, M.D.    DATE OF BIRTH:  October 19, 1955   DATE OF PROCEDURE:  07/15/2004  DATE OF DISCHARGE:                                 OPERATIVE REPORT   SURGEON:  Ronita Hipps, MD   ASSISTANT:  Sheronette A. Cherly Hensen, MD   PREOPERATIVE DIAGNOSIS:  Endocervical adenocarcinoma, high-grade.   POSTOPERATIVE DIAGNOSIS:  High-grade adenocarcinoma involving the junction  of the cervix and uterus, right fallopian tube with primary site unclear by  frozen section; adhesion involving the serosa of the appendix.   PROCEDURES PERFORMED:  1.  Type 2 radical hysterectomy, bilateral salpingo-oophorectomy to debulk      right tubal carcinoma.  2.  Bilateral pelvic and aortic selective lymphadenectomy.  3.  Appendectomy.  4.  Omentectomy.   ANESTHESIA:  General endotracheal.   DESCRIPTION OF FINDINGS AND INDICATIONS FOR SURGERY:  This 55 year old woman  with perimenopausal bleeding underwent hysteroscopy.  She was found to have  a lesion involving the junction of the endocervical canal and lower uterine  segment, polypoid, consistent with a poorly-differentiated/high-grade  carcinoma, endometrioid-type versus serous carcinoma.  At the time of  surgery, the patient had a bulbous cervix with retraction of the right  adnexa but no palpable parametrial involvement.  At intra-abdominal  exploration, there were adhesions involving the left adnexa, with clubbing  of the tube compatible with prior tubal ligation.  The right tube was  elongated, measuring approximately 7 cm in length and approximately 1.5-2 cm  in width.  The tube was extremely firm and on frozen section yielded  involvement by high-grade, likely serous adenocarcinoma.  In the uterus, a  polypoid lesion invaded the mid portion of the  myometrium/upper cervical  stroma with apparent leaf-free margins.  There was no evidence of  intraperitoneal metastasis, with lymph nodes that were reactive but not  pathologically enlarged.  A type 2 radical hysterectomy was performed  because of the uncertain primary site of origin of the tumor in the  endocervical canal.  Appendectomy was performed because there was a small  papillary adhesion present on the serosa and infracolic omentectomy was  performed because of the papillary nature of the lesion.   DESCRIPTION OF PROCEDURE:  The patient was prepped and draped in the low  lithotomy position with an indwelling Foley catheter.  Exploration was  carried out through a vertical midline incision which was extended to the  left of the umbilicus into the lower epigastrium.  Intra-abdominal findings  were as described above.  Washings were obtained and submitted for  cytopathology.  The cornua of the uterus were grasped with long Kelly clamps  and the round ligaments divided, using the harmonic scalpel.  The harmonic  scalpel was used throughout to develop pedicles and control hemostasis  unless otherwise specified.  The right pararectal and paravesical spaces  were developed with sharp and blunt dissection and cardinal ligament  palpated, being free of involvement with cancer.  The superior vesical  artery was isolated, placed on stretch, and the uterine artery marked with a  hemoclip and divided at its origin.  The harmonic scalpel was used to take  down the cardinal ligament, approximately midway between the cervix and the  sidewall.  A window was made above the ureter, and the right  infundibulopelvic ligament was isolated and hemostatically controlled with  the harmonic scalpel before dividing it.  Hemostasis was excellent  throughout the case.  The right tube and ovary were amputated and submitted  for frozen section, which revealed the findings described above.  The right  ureter  was isolated and mobilized in its distal __________, into its  insertion into the ureteric tunnel.  Medial peritoneum and upper uterosacral  ligaments were divided with the harmonic scalpel.   A similar procedure was performed on the left side, with division of the  round ligament laterally, opening the anterior and posterior leaf of the  broad ligament, isolating the infundibulopelvic ligament and controlling  this hemostatically.  The left uterine artery was likewise isolated and  divided at its origin, being marked with a hemoclip.  The upper portion of  the mid cardinal ligament was divided below the level of the ureter with the  harmonic scalpel.  The anterior peritoneal reflection of the uterus was  opened and bladder flap developed with sharp and blunt dissection, using  electrocautery for hemostasis.  The anterior vesicouterine ligament was  divided at the level of the ureter by developing pedicles which were  controlled and divided with the harmonic scalpel.  Right-angle clamp was  used to unroof the ureter during this process, and the ureter was rolled  laterally with further mobilization of the bladder off of the upper vagina.  Parametria and posterior vesicouterine ligaments were then developed into  pedicles which were clamped below the level of the cervix, inside the  ureter, divided and suture ligated with 2-0 Vicryl.  The uterosacral  ligaments on each side were incorporated into pedicles which were cross-  clamped, divided, and suture ligated.  The vagina was entered anteriorly and  the specimen amputated with a small cuff of her vagina.  Vaginal apex was  closed with a locked running suture line of 0 Vicryl.  Additional hemostasis  was achieved where necessary with electrocautery, and the pelvis was  copiously irrigated.  At this juncture, frozen section of the tube had  returned positive for primary or metastatic tubal cancer.  Bilateral selective pelvic lymphadenectomy  was performed, using sharp and blunt  dissection with electrocautery and hemoclips to strip lymph nodes anterior  and medially off of the external iliac vessels and from the obturator fossa  above the obturator nerve.  Nodes lateral to the vessels were not resected.  The paracolic gutter on each side was mobilized and lymph nodes anterior and  lateral to the common iliac arteries bilaterally were harvested in a similar  fashion.  During this dissection, the ureters were retracted medially and  protected.   A transperitoneal approach was used to access the aortic lymph nodes.  Peritoneum overlying the common iliac artery on the right and the right side  of the aorta was opened to the level of the retroperitoneal duodenum.  Retractors were placed retroperitoneally, elevating the retroperitoneal  duodenum.  Using sharp and blunt dissection with electrocautery, harmonic  scalpel, and hemoclips for hemostasis, lymph nodes anterior and lateral to  the right side of the aorta were harvested to above the level of  the  inferior mesenteric artery and above the level of the retroperitoneal  duodenum.  The dissection was continued across the presacral space and lymph  nodes anterior and lateral to the left side of the aorta were likewise  harvested, to above the level of the inferior mesenteric artery.  All  operative sites were inspected and irrigated, with additional hemostasis  achieved using electrocautery where necessary.   Manual and visual exploration of the upper abdomen was performed.  The  serosa of the distal ileal mesentery was slightly thickened, and a patch of  this was biopsied.  Infracolic omentectomy was performed, mobilizing the  omentum off of the transverse colon and developing pedicles which were  controlled with the harmonic scalpel and electrocautery.  The infracolic  omentum was harvested and submitted for final pathology.  The appendix was  then visualized.  A small  excrescence of fibrous tissue was present in the  mid antimesenteric serosa, suspicious for metastasis.  Appendectomy was  performed by elevating the appendix, controlling and dividing the mesentery  with the harmonic scalpel, cross-clamping the base of the appendix,  dividing, and using a 4-1/2 suture of 2-0 Vicryl.  All packs and retractors  were removed, operative sites inspected, and the abdominal wall was closed  in layers.  A running mass closure using 0 PDS was used to reapproximate the  rectus muscles and fascia in a continuous running fashion.  Subcutaneous  adipose tissue was reapproximated with 30 Vicryl after irrigating and skin  with skin clips.  The patient underwent emergence from anesthesia and was  returned to the recovery room in stable condition.  Estimated blood loss 550  mL; transfusions none; drains, packs, etc., Foley to dependent drainage.  Sponge and instrument counts correct x 2.  PATHOLOGY SPECIMENS:  Uterus, tubes and ovaries; right and left external  iliac and obturator lymph nodes; right and left common iliac lymph nodes;  right and left paraaortic lymph nodes; mesenteric biopsy; appendix; omentum;  peritoneal washings.     John   JTS/MEDQ  D:  07/15/2004  T:  07/15/2004  Job:  [id number]   cc:   Maxie Better, M.D.  [address]  [place]  [place] [address]  Fax: [phone]   Pershing Cox, M.D.  [address]  [place]  [place] [address]  Fax: [phone]   Telford Nab, R.N.  [address]  [place], [place] [address]

## 2011-03-09 ENCOUNTER — Other Ambulatory Visit: Payer: Self-pay | Admitting: Oncology

## 2011-03-09 ENCOUNTER — Encounter (HOSPITAL_BASED_OUTPATIENT_CLINIC_OR_DEPARTMENT_OTHER): Payer: Managed Care, Other (non HMO) | Admitting: Oncology

## 2011-03-09 DIAGNOSIS — C5700 Malignant neoplasm of unspecified fallopian tube: Secondary | ICD-10-CM

## 2011-03-09 LAB — CBC WITH DIFFERENTIAL/PLATELET
Basophils Absolute: 0 10*3/uL (ref 0.0–0.1)
EOS%: 1 % (ref 0.0–7.0)
Eosinophils Absolute: 0.1 10*3/uL (ref 0.0–0.5)
HCT: 40.3 % (ref 34.8–46.6)
HGB: 13.6 g/dL (ref 11.6–15.9)
MONO#: 0.3 10*3/uL (ref 0.1–0.9)
NEUT#: 5.6 10*3/uL (ref 1.5–6.5)
NEUT%: 74.1 % (ref 38.4–76.8)
RDW: 12.6 % (ref 11.2–14.5)
WBC: 7.6 10*3/uL (ref 3.9–10.3)
lymph#: 1.5 10*3/uL (ref 0.9–3.3)

## 2011-03-09 LAB — COMPREHENSIVE METABOLIC PANEL
BUN: 10 mg/dL (ref 6–23)
CO2: 26 mEq/L (ref 19–32)
Creatinine, Ser: 0.76 mg/dL (ref 0.50–1.10)
Glucose, Bld: 135 mg/dL — ABNORMAL HIGH (ref 70–99)
Total Bilirubin: 0.5 mg/dL (ref 0.3–1.2)

## 2011-03-09 LAB — CA 125: CA 125: 11.7 U/mL (ref 0.0–30.2)

## 2011-03-10 ENCOUNTER — Ambulatory Visit: Payer: Managed Care, Other (non HMO) | Admitting: Gastroenterology

## 2011-03-12 ENCOUNTER — Ambulatory Visit
Admission: RE | Admit: 2011-03-12 | Discharge: 2011-03-12 | Disposition: A | Discharge status: Home / Self Care | Payer: Managed Care, Other (non HMO) | Source: Ambulatory Visit | Attending: Oncology | Admitting: Oncology

## 2011-03-12 DIAGNOSIS — C5700 Malignant neoplasm of unspecified fallopian tube: Secondary | ICD-10-CM

## 2011-03-12 MED ORDER — IOHEXOL 300 MG/ML  SOLN
125.0000 mL | Freq: Once | INTRAMUSCULAR | Status: AC | PRN
  Administered 2011-03-12: 125 mL via INTRAVENOUS

## 2011-05-01 ENCOUNTER — Encounter (HOSPITAL_BASED_OUTPATIENT_CLINIC_OR_DEPARTMENT_OTHER): Payer: Managed Care, Other (non HMO) | Admitting: Oncology

## 2011-05-01 DIAGNOSIS — C5700 Malignant neoplasm of unspecified fallopian tube: Secondary | ICD-10-CM

## 2011-09-29 ENCOUNTER — Telehealth: Payer: Self-pay | Admitting: Oncology

## 2011-09-29 NOTE — Telephone Encounter (Signed)
S/w the pt and she is aware of the may 2013 appts

## 2012-01-15 ENCOUNTER — Other Ambulatory Visit (HOSPITAL_BASED_OUTPATIENT_CLINIC_OR_DEPARTMENT_OTHER): Payer: BC Managed Care – PPO | Admitting: Lab

## 2012-01-15 ENCOUNTER — Ambulatory Visit (HOSPITAL_BASED_OUTPATIENT_CLINIC_OR_DEPARTMENT_OTHER): Payer: BC Managed Care – PPO | Admitting: Oncology

## 2012-01-15 ENCOUNTER — Encounter: Payer: Self-pay | Admitting: Oncology

## 2012-01-15 VITALS — BP 132/69 | HR 77 | Temp 98.6°F | Ht 65.75 in | Wt 246.2 lb

## 2012-01-15 DIAGNOSIS — G609 Hereditary and idiopathic neuropathy, unspecified: Secondary | ICD-10-CM

## 2012-01-15 DIAGNOSIS — G629 Polyneuropathy, unspecified: Secondary | ICD-10-CM

## 2012-01-15 DIAGNOSIS — C5700 Malignant neoplasm of unspecified fallopian tube: Secondary | ICD-10-CM

## 2012-01-15 DIAGNOSIS — E669 Obesity, unspecified: Secondary | ICD-10-CM | POA: Insufficient documentation

## 2012-01-15 HISTORY — DX: Malignant neoplasm of unspecified fallopian tube: C57.00

## 2012-01-15 HISTORY — DX: Polyneuropathy, unspecified: G62.9

## 2012-01-15 LAB — COMPREHENSIVE METABOLIC PANEL
AST: 16 U/L (ref 0–37)
Albumin: 4.1 g/dL (ref 3.5–5.2)
Alkaline Phosphatase: 92 U/L (ref 39–117)
Calcium: 9 mg/dL (ref 8.4–10.5)
Chloride: 105 mEq/L (ref 96–112)
Potassium: 3.8 mEq/L (ref 3.5–5.3)
Sodium: 140 mEq/L (ref 135–145)
Total Protein: 6.6 g/dL (ref 6.0–8.3)

## 2012-01-15 LAB — CBC WITH DIFFERENTIAL/PLATELET
BASO%: 0.4 % (ref 0.0–2.0)
Basophils Absolute: 0 10*3/uL (ref 0.0–0.1)
EOS%: 1.2 % (ref 0.0–7.0)
HCT: 40.9 % (ref 34.8–46.6)
HGB: 13.6 g/dL (ref 11.6–15.9)
MCH: 29.3 pg (ref 25.1–34.0)
MONO#: 0.4 10*3/uL (ref 0.1–0.9)
RDW: 13 % (ref 11.2–14.5)
WBC: 6.5 10*3/uL (ref 3.9–10.3)
lymph#: 1.9 10*3/uL (ref 0.9–3.3)

## 2012-01-15 NOTE — Patient Instructions (Signed)
Call to schedule mammograms [facility] (last 03-26-11)

## 2012-01-15 NOTE — Progress Notes (Signed)
OFFICE PROGRESS NOTE Date of Visit 01-15-12 Physicians: J.Soper, T.Collins [place], S.Ganem  INTERVAL HISTORY:  Patient is seen, alone for visit today, still on yearly follow up at this office at her request "because it makes me more comfortable". She is seen yearly in Nov by Dr Ronita Hipps at [facility]; primary care is by Dr Collins Scotland whom she sees only prn.  History is of vague abdominal pain for > 1 year before some postmenopausal bleeding led to the gyn cancer diagnosis. She had optimal debulking TAH/BSO/omentectomy/pelvic and paraaortic lymphadenectomy by Dr Kyla Balzarine at [facility] 07-15-2004. 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 She had 6 cycles of adjuvant taxol/ carboplatin thru April 2006 and has been on observation only since then, without known active disease. Last CT AP was in [facility] system July 2012 because of intermittent abdominal pain, with no evidence of recurrent gyn cancer, stable laxity of anterior abdominal wall and stable left renal cyst.   Patient tells me that she has been doing well overall since she was seen last. She has had no recent fever or symptoms of infection. The intermittent abdominal symptoms have been better without any intervention, seem worse when she is anxious. She has no new of different abdominal or pelvic symptoms, no bleeding, no bladder or bowel symptoms, no respiratory problems. Peripheral neuropathy in feet is more bothersome since she stopped doing water exercises; I have encouraged her to resume this.  Remainder of 10 point Review of Systems negative.  Mammograms will be due late July at [facility] which patient plans to schedule herself. Objective:  Vital signs in last 24 hours:  BP 132/69  Pulse 77  Temp(Src) 98.6 F (37 C) (Oral)  Ht 5' 5.75" (1.67 m)  Wt 246 lb 3.2 oz (111.676 kg)  BMI 40.04 kg/m2 Weight is down 11 lbs. Easily ambulatory, looks comfortable.  HEENT:mucous membranes moist, pharynx normal without  lesions. PERRL, not icteric. LymphaticsCervical, supraclavicular, and axillary nodes normal.No inguinal adenopathy Resp: clear to auscultation bilaterally and normal percussion bilaterally Cardio: regular rate and rhythm GI: soft, non-tender; bowel sounds normal; no masses,  no organomegaly Extremities: extremities normal, atraumatic, no cyanosis or edema Breasts without masses, skin changes, nipple findings.  Lab Results:   [facility] 01/15/12 0908  WBC 6.5  HGB 13.6  HCT 40.9  PLT 236  ANC 4.0  BMET Full CMET resulted after visit normal  CA 125 resulted after visit 12.2, stable in good range Studies/Results:  No results found.  Medications: I have reviewed the patient's current medications.  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 She will continue yearly visits with Dr Kyla Balzarine. I will see her back at this office on prn schedule from here. She needs to have full physical exam by primary MD yearly and mammograms yearly.  2.Obesity which we have discussed 3.peripheral neuropathy: resume water exercise  Patient understands that I am glad to see her back at any time if she or other MDs feel that I can be of help.        Mylisa Brunson P, MD   01/15/2012, 10:04 AM

## 2012-01-18 ENCOUNTER — Telehealth: Payer: Self-pay

## 2012-01-18 NOTE — Telephone Encounter (Signed)
Left message for Ms. Jane stating that her chemistries and ca 125 from 01-15-12 were good per Dr. Darrold Span.

## 2013-05-05 ENCOUNTER — Encounter: Payer: Self-pay | Admitting: Oncology

## 2013-11-24 ENCOUNTER — Telehealth: Payer: Self-pay | Admitting: *Deleted

## 2013-11-24 NOTE — Telephone Encounter (Signed)
Message left by pt inquiring if she can use Restasis eye drops prescribed by her opthamologist.  This RN returned call and obtained identified VM- message left informing pt above is ok to use with current medication prescribed by this MD.

## 2014-01-24 ENCOUNTER — Ambulatory Visit: Payer: Self-pay

## 2014-01-29 ENCOUNTER — Ambulatory Visit (INDEPENDENT_AMBULATORY_CARE_PROVIDER_SITE_OTHER): Payer: 59

## 2014-01-29 VITALS — Resp 17 | Ht 66.0 in | Wt 264.0 lb

## 2014-01-29 DIAGNOSIS — M79673 Pain in unspecified foot: Secondary | ICD-10-CM

## 2014-01-29 DIAGNOSIS — M79609 Pain in unspecified limb: Secondary | ICD-10-CM

## 2014-01-29 DIAGNOSIS — M76829 Posterior tibial tendinitis, unspecified leg: Secondary | ICD-10-CM

## 2014-01-29 DIAGNOSIS — M722 Plantar fascial fibromatosis: Secondary | ICD-10-CM

## 2014-01-29 DIAGNOSIS — M7730 Calcaneal spur, unspecified foot: Secondary | ICD-10-CM

## 2014-01-29 MED ORDER — PREDNISONE 10 MG PO KIT: PACK | ORAL | Status: DC

## 2014-01-29 NOTE — Patient Instructions (Signed)

## 2014-01-29 NOTE — Progress Notes (Signed)
   Subjective:    Patient ID: Gail Crawford, female    DOB: May 31, 1956, 58 y.o.   MRN: [id number]  HPI Comments: N foot pain L B/L medial arch and hindfoot D 1 month worsening O history of being treated for the same symptoms in  12/2012 C aching and if rotate foot can have sharp pain A weight bearing T TFC orthotic, ace wrapping feet, Crocs  Foot Pain      Review of Systems  All other systems reviewed and are negative.      Objective:   Physical Exam Lower extremity objective findings as follows vascular status is intact pedal pulses are palpable epicritic and proprioceptive sensations intact and symmetric bilateral is normal plantar response DTRs not listed dermatologic the skin color pigment normal hair growth absent nails somewhat criptotic orthopedic biomechanical exam is pain on palpation Magan plantar fascia medial trochanter tubercle left foot is more tenderness along the posterior tibial tendon distribution of the right foot medial arch and heel areas on resisted posterior tibial tendon function the right is painful tender symptomatic patient promontory changes noted on x-rays collapse the medial column arch Lisfranc's dislocation Lisfranc for fifth metatarsal base and cuboid there is also some sinus tarsitis capsulitis of the subtalar joint and sinus tarsi area bilateral patient does have digital contractures mild hammertoe deformities mild HAV deformity bilateral has orthotics which are worn and and possibly flattening in need replacing she's been wearing shoes are worn and broken down and needs to maintain more stable type shoe she asked vasculatures with memory for which I discouraged her from getting. Patient is advised to maintain a more stiff stable supportive shoe at all times new balance or Rolena Infante or [employer]ICS type running or athletic shoes are recommended or work boot from [employer] it be beneficial. 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 At this time fascial strapping applied to both feet prescription for Sterapred Deas Dosepak is given x6 days patient will also maintain ice as instructed maintain a better more supportive athletic or walking shoe at all times recheck within the to 4 weeks for followup and reevaluation as needed next  Harriet Masson DPM

## 2014-02-01 ENCOUNTER — Ambulatory Visit: Payer: 59 | Admitting: *Deleted

## 2014-02-01 DIAGNOSIS — M722 Plantar fascial fibromatosis: Secondary | ICD-10-CM

## 2014-02-01 NOTE — Patient Instructions (Signed)
.  tfc  

## 2014-02-05 ENCOUNTER — Ambulatory Visit (INDEPENDENT_AMBULATORY_CARE_PROVIDER_SITE_OTHER): Payer: 59 | Admitting: *Deleted

## 2014-02-05 ENCOUNTER — Telehealth: Payer: Self-pay | Admitting: *Deleted

## 2014-02-05 DIAGNOSIS — M722 Plantar fascial fibromatosis: Secondary | ICD-10-CM

## 2014-02-05 NOTE — Progress Notes (Signed)
   Subjective:    Patient ID: Gail Crawford, female    DOB: 1955/09/14, 58 y.o.   MRN: [id number]  HPI RE-WRAP THE RT FOOT.   Review of Systems     Objective:   Physical Exam        Assessment & Plan:

## 2014-02-05 NOTE — Telephone Encounter (Signed)
I was in last week.  He did a wrap on both of my feet.  I took them off today.  I finished my Prednisone.  I took the wraps off this morning.  The right one is still bad this morning.  Is there something else he can do or does he need to see me again?  I called and left her a message to call and schedule an appointment with Dr. Blenda Mounts, 2 weeks from her last visit of 01/29/14.  I called and left her another message to just keep her appointment scheduled for 02/13/14 at 4:15pm with Dr. Blenda Mounts.  You can call an schedule an appointment for a taping if you feel you can't wait until your appointment.  There will be an additional charge for that service, however.

## 2014-02-06 ENCOUNTER — Telehealth: Payer: Self-pay | Admitting: *Deleted

## 2014-02-06 MED ORDER — MELOXICAM 15 MG PO TABS: 15.0000 mg | ORAL_TABLET | Freq: Every day | ORAL | Status: DC

## 2014-02-06 MED ORDER — MELOXICAM 15 MG PO TABS
15.0000 mg | ORAL_TABLET | Freq: Every day | ORAL | Status: DC
Start: 2014-02-06 — End: 2014-03-30

## 2014-02-06 NOTE — Telephone Encounter (Signed)
I called and informed her that Dr. Blenda Mounts is going to prescribe some Meloxicam, which is an anti-inflammatory.  We are going to e-scribe it to your pharmacy.  She stated okay, thank you.

## 2014-02-13 ENCOUNTER — Ambulatory Visit (INDEPENDENT_AMBULATORY_CARE_PROVIDER_SITE_OTHER): Payer: 59

## 2014-02-13 VITALS — BP 179/92 | HR 83 | Resp 16 | Ht 66.0 in | Wt 264.0 lb

## 2014-02-13 DIAGNOSIS — M7730 Calcaneal spur, unspecified foot: Secondary | ICD-10-CM

## 2014-02-13 DIAGNOSIS — M76829 Posterior tibial tendinitis, unspecified leg: Secondary | ICD-10-CM

## 2014-02-13 DIAGNOSIS — M722 Plantar fascial fibromatosis: Secondary | ICD-10-CM

## 2014-02-13 MED ORDER — HYDROCODONE-ACETAMINOPHEN 5-325 MG PO TABS: 1.0000 | ORAL_TABLET | Freq: Three times a day (TID) | ORAL | Status: DC | PRN

## 2014-02-13 NOTE — Patient Instructions (Signed)

## 2014-02-13 NOTE — Progress Notes (Signed)
   Subjective:    Patient ID: Gail Crawford, female    DOB: [birth year], 58 y.o.   MRN: [id number]  HPI Comments: Pt presents for follow-up B/L plantar fasciitis.  Pt brings in [place] athletic shoes for Dr. Blenda Mounts to evaluate with her orthotics.     Review of Systems no new findings or systemic changes noted     Objective:   Physical Exam Neurovascular status is intact pedal pulses are palpable patient had to take twice after last visit the taping is significant improvement the orthotics have helped slightly patient does get some new Brooks running shoes the area all stability model which fit and contour well to forefoot patient is advised to start using these with her current orthotics recheck in one to 2 if she continues to have difficulty may be candidate for new orthoses orthotic adjustments patient will be recheck again in one to 2 months for reevaluate       Assessment & Plan:  Assessment persistent plantar fasciitis/heel spur syndrome orthotics made some additional adjustment this time we'll initiate new shoes previous shoes are worn down and the replacing with a good stability shoe at this time also recommended ice if needed and a prescription for breakthrough pain for some hydrocodone acetaminophen on an as-needed basis. Patient will maintain her multiple laxity or MOBIC medication as instructed once daily. Reevaluate as needed in one to 2 next progress Harriet Masson DPM

## 2014-03-30 ENCOUNTER — Ambulatory Visit (INDEPENDENT_AMBULATORY_CARE_PROVIDER_SITE_OTHER): Payer: 59

## 2014-03-30 VITALS — BP 152/76 | HR 83 | Resp 16

## 2014-03-30 DIAGNOSIS — M79673 Pain in unspecified foot: Secondary | ICD-10-CM

## 2014-03-30 DIAGNOSIS — M79609 Pain in unspecified limb: Secondary | ICD-10-CM

## 2014-03-30 DIAGNOSIS — M7730 Calcaneal spur, unspecified foot: Secondary | ICD-10-CM

## 2014-03-30 DIAGNOSIS — M76829 Posterior tibial tendinitis, unspecified leg: Secondary | ICD-10-CM

## 2014-03-30 DIAGNOSIS — M722 Plantar fascial fibromatosis: Secondary | ICD-10-CM

## 2014-03-30 MED ORDER — TRIAMCINOLONE ACETONIDE 10 MG/ML IJ SUSP: 10.0000 mg | Freq: Once | INTRAMUSCULAR | Status: DC

## 2014-03-30 NOTE — Progress Notes (Signed)
   Subjective:    Patient ID: JYNESIS NAKAMURA, female    DOB: 12/18/1955, 58 y.o.   MRN: [id number]  HPI Comments: "I am really having a problem with the right foot. My doctor put me on a new medicine for my back so I stopped the mobic"  Plantar Fasciitis - Follow up bilateral heels     Review of Systems no new findings or systemic changes noted     Objective:   Physical Exam Lower extremity objective findings unchanged patient continues to have plantar fascial symptomology right foot more so than left. The met pads helped however they slid and moved and she started causing irritation and couldn't tolerate the orthotics at this time orthotics are examined the arch is have flattened and he adjustment of the orthotic is carried out to raise the medial arch in the met pads are removed once orthotics adjusted should maintain structure. Patient continues to have pain and is requesting an additional injection tender with Kenalog 20 mg Xylocaine plain is recommended at this time to help alleviate her reduce symptomology wishes been recalcitrant. Orthopedic exam otherwise unremarkable rectus foot type ankle mid tarsus subtalar joint motions normal there is tenderness along the posterior tibial tendon posse some tibialis anterior tibialis posterior compensatory pain or gait changes to the promontory gait. This time followup in 2 months       Assessment & Plan:  Assessment plantar fasciitis/heel spur syndrome right more so than left orthotics are just at this time injection tender with Kenalog, Xylocaine plain. If her right heel patient had switched from [facility] to diclofenac for her orthopedic doctor patient is to continue with diclofenac which is a good NSAID alternatives. Reevaluate with the next one to 2 months if symptoms persist or fail to improve. Maintain orthoses as instructed next  Harriet Masson DPM

## 2014-03-30 NOTE — Patient Instructions (Signed)

## 2014-05-17 ENCOUNTER — Telehealth: Payer: Self-pay | Admitting: *Deleted

## 2014-05-17 NOTE — Telephone Encounter (Signed)
I'm a patient of Dr. Blenda Mounts.  He's been looking at my foot and everything.  I'm about out of my pain medicine.  I want to see if he will renew my pain medicine for me.  If you have any questions, just call me back.  Thanks!

## 2014-05-17 NOTE — Telephone Encounter (Signed)
Which pain medication is patient requesting. I had previously prescribed MOBIC however that was apparently discontinued and changed to diclofenac. Either way I will give her another 2 refills on either meloxicam or MOBIC, or diclofenac. Ask her which is being currently taking.  MOBIC 15 mg, dispense 30 tabs, 1 by mouth p.c. daily  Or  Diclofenac 75 mg, dispense #60, one by mouth p.c. twice a day  Which ever is chosen allow for 2 refills  Harriet Masson DPM

## 2014-05-18 ENCOUNTER — Telehealth: Payer: Self-pay | Admitting: *Deleted

## 2014-05-18 NOTE — Telephone Encounter (Signed)
I called and left her a message that Dr. Blenda Mounts said no to the Hydrocodone.  He would like you to schedule an appointment for follow-up to discuss further treatment.  Please give [place] a call to schedule an appointment.

## 2014-05-18 NOTE — Telephone Encounter (Signed)
I attempted to call the patient back, left message for a return call.

## 2014-05-18 NOTE — Telephone Encounter (Signed)
I called and asked her what pain medication she was referring to.  She stated, "The Hydrocodone, he gave it to me in June.  I need it for work."  I told her I would ask Dr. Blenda Mounts and see what he says.

## 2014-05-18 NOTE — Telephone Encounter (Signed)
Hydrocodone is narcotic cannot be used if working. The patient the option of MOBIC or diclofenac at this time. If she is continuing to have pain and there is no improvement needs to followup and we discuss other options for treatment.  Harriet Masson DPM

## 2014-05-18 NOTE — Telephone Encounter (Signed)
If you could give me a call back.  I called yesterday but didn't hear anything back.   I called and left patient a message with Dr. Erasmo Downer answer to previous message.

## 2014-05-23 ENCOUNTER — Ambulatory Visit: Payer: 59

## 2015-04-18 ENCOUNTER — Other Ambulatory Visit: Payer: Self-pay | Admitting: Radiology

## 2015-04-30 ENCOUNTER — Other Ambulatory Visit: Payer: Self-pay | Admitting: General Surgery

## 2015-06-06 ENCOUNTER — Encounter (HOSPITAL_BASED_OUTPATIENT_CLINIC_OR_DEPARTMENT_OTHER): Payer: Self-pay | Admitting: *Deleted

## 2015-06-11 ENCOUNTER — Ambulatory Visit (HOSPITAL_BASED_OUTPATIENT_CLINIC_OR_DEPARTMENT_OTHER)
Admission: RE | Admit: 2015-06-11 | Discharge: 2015-06-11 | Disposition: A | Discharge status: Home / Self Care | Payer: 59 | Source: Ambulatory Visit | Attending: General Surgery | Admitting: General Surgery

## 2015-06-11 ENCOUNTER — Encounter (HOSPITAL_BASED_OUTPATIENT_CLINIC_OR_DEPARTMENT_OTHER)
Admission: RE | Disposition: A | Discharge status: Home / Self Care | Payer: Self-pay | Source: Ambulatory Visit | Attending: General Surgery

## 2015-06-11 ENCOUNTER — Encounter (HOSPITAL_BASED_OUTPATIENT_CLINIC_OR_DEPARTMENT_OTHER): Payer: Self-pay | Admitting: *Deleted

## 2015-06-11 ENCOUNTER — Ambulatory Visit (HOSPITAL_BASED_OUTPATIENT_CLINIC_OR_DEPARTMENT_OTHER): Payer: 59 | Admitting: Anesthesiology

## 2015-06-11 DIAGNOSIS — R1900 Intra-abdominal and pelvic swelling, mass and lump, unspecified site: Secondary | ICD-10-CM | POA: Diagnosis present

## 2015-06-11 DIAGNOSIS — Z8544 Personal history of malignant neoplasm of other female genital organs: Secondary | ICD-10-CM | POA: Diagnosis not present

## 2015-06-11 DIAGNOSIS — D171 Benign lipomatous neoplasm of skin and subcutaneous tissue of trunk: Secondary | ICD-10-CM | POA: Insufficient documentation

## 2015-06-11 DIAGNOSIS — Z6841 Body Mass Index (BMI) 40.0 and over, adult: Secondary | ICD-10-CM | POA: Insufficient documentation

## 2015-06-11 HISTORY — PX: MASS EXCISION: SHX2000

## 2015-06-11 SURGERY — EXCISION MASS
Anesthesia: General | Site: Abdomen | Laterality: Left

## 2015-06-11 MED ORDER — LIDOCAINE HCL (CARDIAC) 20 MG/ML IV SOLN
INTRAVENOUS | Status: AC
  Filled 2015-06-11: qty 5

## 2015-06-11 MED ORDER — BUPIVACAINE-EPINEPHRINE 0.5% -1:200000 IJ SOLN
INTRAMUSCULAR | Status: DC | PRN
  Administered 2015-06-11: 30 mL

## 2015-06-11 MED ORDER — OXYCODONE HCL 5 MG PO TABS: 5.0000 mg | ORAL_TABLET | ORAL | Status: DC | PRN

## 2015-06-11 MED ORDER — MIDAZOLAM HCL 2 MG/2ML IJ SOLN
INTRAMUSCULAR | Status: AC
  Filled 2015-06-11: qty 4

## 2015-06-11 MED ORDER — LIDOCAINE HCL (CARDIAC) 20 MG/ML IV SOLN
INTRAVENOUS | Status: DC | PRN
  Administered 2015-06-11: 75 mg via INTRAVENOUS

## 2015-06-11 MED ORDER — PROPOFOL 10 MG/ML IV BOLUS
INTRAVENOUS | Status: DC | PRN
  Administered 2015-06-11: 200 mg via INTRAVENOUS

## 2015-06-11 MED ORDER — SODIUM CHLORIDE 0.9 % IV SOLN: 250.0000 mL | INTRAVENOUS | Status: DC | PRN

## 2015-06-11 MED ORDER — OXYCODONE HCL 5 MG PO TABS
5.0000 mg | ORAL_TABLET | Freq: Once | ORAL | Status: AC | PRN
  Administered 2015-06-11: 5 mg via ORAL

## 2015-06-11 MED ORDER — CEFAZOLIN SODIUM-DEXTROSE 2-3 GM-% IV SOLR
2.0000 g | INTRAVENOUS | Status: AC
  Administered 2015-06-11: 3 g via INTRAVENOUS

## 2015-06-11 MED ORDER — CEFAZOLIN SODIUM 1-5 GM-% IV SOLN
INTRAVENOUS | Status: AC
  Filled 2015-06-11: qty 50

## 2015-06-11 MED ORDER — DEXAMETHASONE SODIUM PHOSPHATE 4 MG/ML IJ SOLN
INTRAMUSCULAR | Status: DC | PRN
  Administered 2015-06-11: 10 mg via INTRAVENOUS

## 2015-06-11 MED ORDER — HYDROMORPHONE HCL 1 MG/ML IJ SOLN
INTRAMUSCULAR | Status: AC
  Filled 2015-06-11: qty 1

## 2015-06-11 MED ORDER — MEPERIDINE HCL 25 MG/ML IJ SOLN: 6.2500 mg | INTRAMUSCULAR | Status: DC | PRN

## 2015-06-11 MED ORDER — OXYCODONE HCL 5 MG PO TABS
ORAL_TABLET | ORAL | Status: AC
  Filled 2015-06-11: qty 1

## 2015-06-11 MED ORDER — HYDROMORPHONE HCL 1 MG/ML IJ SOLN
0.2500 mg | INTRAMUSCULAR | Status: DC | PRN
  Administered 2015-06-11 (×2): 0.5 mg via INTRAVENOUS

## 2015-06-11 MED ORDER — PROPOFOL 500 MG/50ML IV EMUL
INTRAVENOUS | Status: AC
  Filled 2015-06-11: qty 50

## 2015-06-11 MED ORDER — ACETAMINOPHEN 650 MG RE SUPP: 650.0000 mg | RECTAL | Status: DC | PRN

## 2015-06-11 MED ORDER — OXYCODONE-ACETAMINOPHEN 5-325 MG PO TABS: 1.0000 | ORAL_TABLET | ORAL | Status: DC | PRN

## 2015-06-11 MED ORDER — SCOPOLAMINE 1 MG/3DAYS TD PT72: 1.0000 | MEDICATED_PATCH | Freq: Once | TRANSDERMAL | Status: DC | PRN

## 2015-06-11 MED ORDER — ACETAMINOPHEN 325 MG PO TABS: 650.0000 mg | ORAL_TABLET | ORAL | Status: DC | PRN

## 2015-06-11 MED ORDER — OXYCODONE HCL 5 MG/5ML PO SOLN: 5.0000 mg | Freq: Once | ORAL | Status: AC | PRN

## 2015-06-11 MED ORDER — SUCCINYLCHOLINE CHLORIDE 20 MG/ML IJ SOLN
INTRAMUSCULAR | Status: AC
  Filled 2015-06-11: qty 1

## 2015-06-11 MED ORDER — CEFAZOLIN SODIUM-DEXTROSE 2-3 GM-% IV SOLR
INTRAVENOUS | Status: AC
  Filled 2015-06-11: qty 50

## 2015-06-11 MED ORDER — FENTANYL CITRATE (PF) 100 MCG/2ML IJ SOLN
INTRAMUSCULAR | Status: DC | PRN
  Administered 2015-06-11: 100 ug via INTRAVENOUS

## 2015-06-11 MED ORDER — FENTANYL CITRATE (PF) 100 MCG/2ML IJ SOLN
INTRAMUSCULAR | Status: AC
  Filled 2015-06-11: qty 4

## 2015-06-11 MED ORDER — SODIUM CHLORIDE 0.9 % IJ SOLN: 3.0000 mL | INTRAMUSCULAR | Status: DC | PRN

## 2015-06-11 MED ORDER — BUPIVACAINE HCL (PF) 0.25 % IJ SOLN
INTRAMUSCULAR | Status: AC
  Filled 2015-06-11: qty 30

## 2015-06-11 MED ORDER — SODIUM CHLORIDE 0.9 % IJ SOLN: 3.0000 mL | Freq: Two times a day (BID) | INTRAMUSCULAR | Status: DC

## 2015-06-11 MED ORDER — ONDANSETRON HCL 4 MG/2ML IJ SOLN
INTRAMUSCULAR | Status: AC
  Filled 2015-06-11: qty 2

## 2015-06-11 MED ORDER — BUPIVACAINE-EPINEPHRINE (PF) 0.25% -1:200000 IJ SOLN
INTRAMUSCULAR | Status: AC
  Filled 2015-06-11: qty 30

## 2015-06-11 MED ORDER — MIDAZOLAM HCL 5 MG/5ML IJ SOLN
INTRAMUSCULAR | Status: DC | PRN
  Administered 2015-06-11: 2 mg via INTRAVENOUS

## 2015-06-11 MED ORDER — LACTATED RINGERS IV SOLN
INTRAVENOUS | Status: DC
  Administered 2015-06-11 (×2): via INTRAVENOUS

## 2015-06-11 MED ORDER — DEXAMETHASONE SODIUM PHOSPHATE 10 MG/ML IJ SOLN
INTRAMUSCULAR | Status: AC
  Filled 2015-06-11: qty 1

## 2015-06-11 MED ORDER — GLYCOPYRROLATE 0.2 MG/ML IJ SOLN: 0.2000 mg | Freq: Once | INTRAMUSCULAR | Status: DC | PRN

## 2015-06-11 MED ORDER — EPHEDRINE SULFATE 50 MG/ML IJ SOLN
INTRAMUSCULAR | Status: AC
  Filled 2015-06-11: qty 1

## 2015-06-11 MED ORDER — PROPOFOL 10 MG/ML IV BOLUS
INTRAVENOUS | Status: AC
  Filled 2015-06-11: qty 20

## 2015-06-11 SURGICAL SUPPLY — 49 items
BLADE HEX COATED 2.75 (ELECTRODE) ×3 IMPLANT
BLADE SURG 10 STRL SS (BLADE) ×3 IMPLANT
BLADE SURG 15 STRL LF DISP TIS (BLADE) ×1 IMPLANT
BLADE SURG 15 STRL SS (BLADE) ×2
CANISTER SUCT 1200ML W/VALVE (MISCELLANEOUS) ×3 IMPLANT
CHLORAPREP W/TINT 26ML (MISCELLANEOUS) ×3 IMPLANT
COVER BACK TABLE 60X90IN (DRAPES) ×3 IMPLANT
COVER MAYO STAND STRL (DRAPES) ×3 IMPLANT
DECANTER SPIKE VIAL GLASS SM (MISCELLANEOUS) IMPLANT
DRAPE LAPAROSCOPIC ABDOMINAL (DRAPES) ×3 IMPLANT
DRAPE LAPAROTOMY 100X72 PEDS (DRAPES) IMPLANT
DRAPE UTILITY XL STRL (DRAPES) ×3 IMPLANT
ELECT REM PT RETURN 9FT ADLT (ELECTROSURGICAL) ×3
ELECTRODE REM PT RTRN 9FT ADLT (ELECTROSURGICAL) ×1 IMPLANT
GLOVE BIO SURGEON STRL SZ 6 (GLOVE) ×3 IMPLANT
GLOVE BIO SURGEON STRL SZ 6.5 (GLOVE) ×2 IMPLANT
GLOVE BIO SURGEONS STRL SZ 6.5 (GLOVE) ×1
GLOVE BIOGEL PI IND STRL 6.5 (GLOVE) ×1 IMPLANT
GLOVE BIOGEL PI IND STRL 7.0 (GLOVE) ×1 IMPLANT
GLOVE BIOGEL PI INDICATOR 6.5 (GLOVE) ×2
GLOVE BIOGEL PI INDICATOR 7.0 (GLOVE) ×2
GLOVE EXAM NITRILE EXT CUFF MD (GLOVE) ×3 IMPLANT
GOWN STRL REUS W/ TWL LRG LVL3 (GOWN DISPOSABLE) ×1 IMPLANT
GOWN STRL REUS W/TWL 2XL LVL3 (GOWN DISPOSABLE) ×3 IMPLANT
GOWN STRL REUS W/TWL LRG LVL3 (GOWN DISPOSABLE) ×2
LIQUID BAND (GAUZE/BANDAGES/DRESSINGS) ×3 IMPLANT
NEEDLE HYPO 25X1 1.5 SAFETY (NEEDLE) ×3 IMPLANT
NS IRRIG 1000ML POUR BTL (IV SOLUTION) ×3 IMPLANT
PACK BASIN DAY SURGERY FS (CUSTOM PROCEDURE TRAY) ×3 IMPLANT
PACK UNIVERSAL I (CUSTOM PROCEDURE TRAY) IMPLANT
PENCIL BUTTON HOLSTER BLD 10FT (ELECTRODE) ×3 IMPLANT
SLEEVE SCD COMPRESS KNEE MED (MISCELLANEOUS) ×3 IMPLANT
SPONGE GAUZE 4X4 12PLY STER LF (GAUZE/BANDAGES/DRESSINGS) IMPLANT
SPONGE LAP 18X18 X RAY DECT (DISPOSABLE) ×3 IMPLANT
STAPLER VISISTAT 35W (STAPLE) IMPLANT
SUT MNCRL AB 4-0 PS2 18 (SUTURE) ×3 IMPLANT
SUT SILK 3 0 TIES 17X18 (SUTURE)
SUT SILK 3-0 18XBRD TIE BLK (SUTURE) IMPLANT
SUT VIC AB 2-0 SH 27 (SUTURE) ×2
SUT VIC AB 2-0 SH 27XBRD (SUTURE) ×1 IMPLANT
SUT VIC AB 3-0 SH 27 (SUTURE) ×2
SUT VIC AB 3-0 SH 27X BRD (SUTURE) ×1 IMPLANT
SUT VICRYL 4-0 PS2 18IN ABS (SUTURE) IMPLANT
SYR CONTROL 10ML LL (SYRINGE) ×3 IMPLANT
TOWEL OR 17X24 6PK STRL BLUE (TOWEL DISPOSABLE) ×3 IMPLANT
TOWEL OR NON WOVEN STRL DISP B (DISPOSABLE) IMPLANT
TUBE CONNECTING 20'X1/4 (TUBING) ×1
TUBE CONNECTING 20X1/4 (TUBING) ×2 IMPLANT
YANKAUER SUCT BULB TIP NO VENT (SUCTIONS) ×3 IMPLANT

## 2015-06-11 NOTE — Anesthesia Preprocedure Evaluation (Addendum)
Anesthesia Evaluation  Patient identified by MRN, date of birth, ID band Patient awake    Reviewed: Allergy & Precautions, NPO status , Patient's Chart, lab work & pertinent test results  Airway Mallampati: I  TM Distance: >3 FB Neck ROM: Full    Dental  (+) Teeth Intact, Dental Advisory Given   Pulmonary asthma ,    breath sounds clear to auscultation       Cardiovascular  Rhythm:Regular Rate:Normal     Neuro/Psych    GI/Hepatic   Endo/Other  Morbid obesity  Renal/GU      Musculoskeletal   Abdominal   Peds  Hematology   Anesthesia Other Findings   Reproductive/Obstetrics                            Anesthesia Physical Anesthesia Plan  ASA: II  Anesthesia Plan: General   Post-op Pain Management:    Induction: Intravenous  Airway Management Planned: LMA  Additional Equipment:   Intra-op Plan:   Post-operative Plan: Extubation in OR  Informed Consent: I have reviewed the patients History and Physical, chart, labs and discussed the procedure including the risks, benefits and alternatives for the proposed anesthesia with the patient or authorized representative who has indicated his/her understanding and acceptance.   Dental advisory given  Plan Discussed with: Anesthesiologist, CRNA and Surgeon  Anesthesia Plan Comments:         Anesthesia Quick Evaluation

## 2015-06-11 NOTE — H&P (Signed)
Gail Crawford is an 60 y.o. female.   Chief Complaint: left abdominal wall mass HPI:  Patient is a 59 yo F with an enlarging bothersome mass in the LUQ.    Past Medical History  Diagnosis Date  . Asthma 01-2004  . Carcinoma of fallopian tube, right ([place]) 07-15-04    Past Surgical History  Procedure Laterality Date  . Cesarean section  1978  . Abdominal hysterectomy  07-15-04    TYPE II RADICAL HYSTERECTOMY WITH BSO  . Orthopedic surgery      RIGHT SHOULDER AND ELBOW AND RIGHT FOOT    Family History  Problem Relation Age of Onset  . Heart disease Mother   . Heart disease Father    Social History:  reports that she has never smoked. She has never used smokeless tobacco. She reports that she does not drink alcohol or use illicit drugs.  Allergies:  Allergies  Allergen Reactions  . Sulfa Antibiotics Nausea And Vomiting    Medications Prior to Admission  Medication Sig Dispense Refill  . cyclobenzaprine (FLEXERIL) 5 MG tablet Take 5 mg by mouth 3 (three) times daily as needed for muscle spasms.    . diclofenac (VOLTAREN) 75 MG EC tablet Take 75 mg by mouth 2 (two) times daily.    Marland Kitchen HYDROcodone-acetaminophen (NORCO/VICODIN) 5-325 MG per tablet Take 1 tablet by mouth every 8 (eight) hours as needed. 30 tablet 0  . fexofenadine (ALLEGRA) 180 MG tablet Take 180 mg by mouth daily.      No results found for this or any previous visit (from the past 48 hour(s)). No results found.  Review of Systems  All other systems reviewed and are negative.   Blood pressure 155/104, pulse 72, temperature 98 F (36.7 C), temperature source Oral, resp. rate 18, height 5\' 6"  (1.676 m), weight 121.927 kg (268 lb 12.8 oz), SpO2 100 %. Physical Exam  Constitutional: She is oriented to person, place, and time. She appears well-developed and well-nourished.  HENT:  Head: Normocephalic and atraumatic.  Eyes: Conjunctivae are normal. Pupils are equal, round, and reactive to light.  Neck:  Normal range of motion. Neck supple.  Cardiovascular: Normal rate and regular rhythm.   Respiratory: Effort normal and breath sounds normal.  GI: Soft. Bowel sounds are normal.  3 x 2 cm mass left abdominal wall  Musculoskeletal: Normal range of motion. She exhibits no edema or tenderness.  Neurological: She is alert and oriented to person, place, and time.  Skin: Skin is warm and dry.  Psychiatric: She has a normal mood and affect. Her behavior is normal. Judgment and thought content normal.     Assessment/Plan Left upper quadrant abdominal wall mass. Will excise. Reviewed risks and benefits.   Pt agreeable to proceed.    Maciej Schweitzer 06/11/2015, 7:37 AM

## 2015-06-11 NOTE — Transfer of Care (Signed)
Immediate Anesthesia Transfer of Care Note  Patient: Gail Crawford  Procedure(s) Performed: Procedure(s): EXCISION LEFT ABDOMINAL WALL MASS 6X3CM (Left)  Patient Location: PACU  Anesthesia Type:General  Level of Consciousness: sedated  Airway & Oxygen Therapy: Patient Spontanous Breathing and Patient connected to face mask oxygen  Post-op Assessment: Report given to RN and Post -op Vital signs reviewed and stable  Post vital signs: Reviewed and stable  Last Vitals:  Filed Vitals:   06/11/15 0848  BP: 150/77  Pulse:   Temp:   Resp:     Complications: No apparent anesthesia complications

## 2015-06-11 NOTE — Anesthesia Procedure Notes (Signed)
Procedure Name: LMA Insertion Date/Time: 06/11/2015 7:51 AM Performed by: Melynda Ripple D Pre-anesthesia Checklist: Patient identified, Emergency Drugs available, Suction available and Patient being monitored Patient Re-evaluated:Patient Re-evaluated prior to inductionOxygen Delivery Method: Circle System Utilized Preoxygenation: Pre-oxygenation with 100% oxygen Intubation Type: IV induction Ventilation: Mask ventilation without difficulty LMA: LMA inserted LMA Size: 4.0 Number of attempts: 1 Airway Equipment and Method: Bite block Placement Confirmation: positive ETCO2 Tube secured with: Tape Dental Injury: Teeth and Oropharynx as per pre-operative assessment

## 2015-06-11 NOTE — Discharge Instructions (Addendum)
[place] Office Phone Number [phone]   POST OP INSTRUCTIONS  Always review your discharge instruction sheet given to you by the facility where your surgery was performed.  IF YOU HAVE DISABILITY OR FAMILY LEAVE FORMS, YOU MUST BRING THEM TO THE OFFICE FOR PROCESSING.  DO NOT GIVE THEM TO YOUR DOCTOR.  1. A prescription for pain medication may be given to you upon discharge.  Take your pain medication as prescribed, if needed.  If narcotic pain medicine is not needed, then you may take acetaminophen (Tylenol) or ibuprofen (Advil) as needed. 2. Take your usually prescribed medications unless otherwise directed 3. If you need a refill on your pain medication, please contact your pharmacy.  They will contact our office to request authorization.  Prescriptions will not be filled after 5pm or on week-ends. 4. You should eat very light the first 24 hours after surgery, such as soup, crackers, pudding, etc.  Resume your normal diet the day after surgery 5. It is common to experience some constipation if taking pain medication after surgery.  Increasing fluid intake and taking a stool softener will usually help or prevent this problem from occurring.  A mild laxative (Milk of Magnesia or Miralax) should be taken according to package directions if there are no bowel movements after 48 hours. 6. You may shower in 48 hours.  The surgical glue will flake off in 2-3 weeks.   7. ACTIVITIES:  No strenuous activity or heavy lifting for 1 week.   a. You may drive when you no longer are taking prescription pain medication, you can comfortably wear a seatbelt, and you can safely maneuver your car and apply brakes. b. RETURN TO WORK:  __________1-2 weeks or so______________ Dennis Bast should see your doctor in the office for a follow-up appointment approximately three-four weeks after your surgery.    WHEN TO CALL YOUR DOCTOR: 1. Fever over 101.0 2. Nausea and/or vomiting. 3. Extreme swelling or  bruising. 4. Continued bleeding from incision. 5. Increased pain, redness, or drainage from the incision.  The clinic staff is available to answer your questions during regular business hours.  Please dont hesitate to call and ask to speak to one of the nurses for clinical concerns.  If you have a medical emergency, go to the nearest emergency room or call 911.  A surgeon from [facility] is always on call at the hospital.  For further questions, please visit [email or web address]      Post Anesthesia Home Care Instructions  Activity: Get plenty of rest for the remainder of the day. A responsible adult should stay with you for 24 hours following the procedure.  For the next 24 hours, DO NOT: -Drive a car -Paediatric nurse -Drink alcoholic beverages -Take any medication unless instructed by your physician -Make any legal decisions or sign important papers.  Meals: Start with liquid foods such as gelatin or soup. Progress to regular foods as tolerated. Avoid greasy, spicy, heavy foods. If nausea and/or vomiting occur, drink only clear liquids until the nausea and/or vomiting subsides. Call your physician if vomiting continues.  Special Instructions/Symptoms: Your throat may feel dry or sore from the anesthesia or the breathing tube placed in your throat during surgery. If this causes discomfort, gargle with warm salt water. The discomfort should disappear within 24 hours.  If you had a scopolamine patch placed behind your ear for the management of post- operative nausea and/or vomiting:  1. The medication in the patch is effective for 72 hours,  which it should be removed.  Wrap patch in a tissue and discard in the trash. Wash hands thoroughly with soap and water. °2. You may remove the patch earlier than 72 hours if you experience unpleasant side effects which may include dry mouth, dizziness or visual disturbances. °3. Avoid touching the patch. Wash your  hands with soap and water after contact with the patch. °  ° ° °

## 2015-06-11 NOTE — Op Note (Signed)
PRE-OPERATIVE DIAGNOSIS: left abdominal wall mass  POST-OPERATIVE DIAGNOSIS:  Same  PROCEDURE:  Procedure(s): Excision of left abdominal wall mass, subcutaneous, 4x2x3 cm  SURGEON:  Surgeon(s): Stark Klein, MD  ANESTHESIA:   local and general  DRAINS: none   LOCAL MEDICATIONS USED:  MARCAINE     SPECIMEN:  Source of Specimen:  left abdominal wall mass  DISPOSITION OF SPECIMEN:  PATHOLOGY  COUNTS:  YES  DICTATION: .Dragon Dictation  PLAN OF CARE: Discharge to home after PACU  PATIENT DISPOSITION:  PACU - hemodynamically stable.  FINDINGS:  Fatty mass consistent with a lipoma  EBL: min  PROCEDURE:    Patient was identified in the holding area and taken operating room where she was placed supine on the operating room table. General anesthesia was induced. Her left abdominal wall was prepped and draped in sterile fashion. Timeout was performed according to the surgical safety checklist. When all was correct, we continued.  A transverse incision was marked over the mass in the left upper quadrant. This was infiltrated with local anesthetic. Incision was made with a #10 blade. The saphenous tissues are divided with the cautery. The mass was available relatively quickly. This was very fatty consistent with a lipoma. The mass was elevated digitally and the surrounding attachments were divided with the cautery. This was lobulated on the deep aspect, and the lobules were taken with the mass.  The mass was then passed off. The cavity was then inspected for hemostasis. There are several areas that were cauterized. The incision was then irrigated. The incision was reinspected for hemostasis. The incision was then closed in layers with 2-0 Vicryl deep sutures, 3-0 Vicryl interrupted deep dermal sutures, and 4-0 Monocryl running subcuticular suture. The incision was then cleaned, dried, and dressed with Dermabond. Needle, sponge, and instrument counts were correct.

## 2015-06-11 NOTE — Anesthesia Postprocedure Evaluation (Signed)
  Anesthesia Post-op Note  Patient: Gail Crawford  Procedure(s) Performed: Procedure(s): EXCISION LEFT ABDOMINAL WALL MASS 6X3CM (Left)  Patient Location: PACU  Anesthesia Type: General   Level of Consciousness: awake, alert  and oriented  Airway and Oxygen Therapy: Patient Spontanous Breathing  Post-op Pain: mild  Post-op Assessment: Post-op Vital signs reviewed  Post-op Vital Signs: Reviewed  Last Vitals:  Filed Vitals:   06/11/15 0930  BP: 151/67  Pulse: 76  Temp:   Resp: 15    Complications: No apparent anesthesia complications

## 2015-06-12 ENCOUNTER — Encounter (HOSPITAL_BASED_OUTPATIENT_CLINIC_OR_DEPARTMENT_OTHER): Payer: Self-pay | Admitting: General Surgery

## 2015-06-12 LAB — POCT HEMOGLOBIN-HEMACUE: HEMOGLOBIN: 11.5 g/dL — AB (ref 12.0–15.0)

## 2015-06-12 NOTE — Progress Notes (Signed)
Quick Note:  Please let patient know pathology is benign. ______ 

## 2018-08-18 ENCOUNTER — Encounter (INDEPENDENT_AMBULATORY_CARE_PROVIDER_SITE_OTHER): Payer: Self-pay

## 2018-09-05 ENCOUNTER — Ambulatory Visit (INDEPENDENT_AMBULATORY_CARE_PROVIDER_SITE_OTHER): Payer: Managed Care, Other (non HMO) | Admitting: Family Medicine

## 2018-09-05 ENCOUNTER — Encounter (INDEPENDENT_AMBULATORY_CARE_PROVIDER_SITE_OTHER): Payer: Self-pay | Admitting: Family Medicine

## 2018-09-05 VITALS — BP 171/83 | HR 68 | Temp 97.8°F | Ht 65.0 in | Wt 266.0 lb

## 2018-09-05 DIAGNOSIS — I10 Essential (primary) hypertension: Secondary | ICD-10-CM

## 2018-09-05 DIAGNOSIS — E559 Vitamin D deficiency, unspecified: Secondary | ICD-10-CM | POA: Diagnosis not present

## 2018-09-05 DIAGNOSIS — Z6841 Body Mass Index (BMI) 40.0 and over, adult: Secondary | ICD-10-CM

## 2018-09-05 DIAGNOSIS — R5383 Other fatigue: Secondary | ICD-10-CM | POA: Diagnosis not present

## 2018-09-05 DIAGNOSIS — Z1331 Encounter for screening for depression: Secondary | ICD-10-CM

## 2018-09-05 DIAGNOSIS — R0602 Shortness of breath: Secondary | ICD-10-CM | POA: Diagnosis not present

## 2018-09-05 DIAGNOSIS — I517 Cardiomegaly: Secondary | ICD-10-CM

## 2018-09-05 DIAGNOSIS — Z9189 Other specified personal risk factors, not elsewhere classified: Secondary | ICD-10-CM | POA: Diagnosis not present

## 2018-09-05 DIAGNOSIS — Z0289 Encounter for other administrative examinations: Secondary | ICD-10-CM

## 2018-09-05 NOTE — Progress Notes (Signed)
Office: [phone]  /  Fax: [phone]   Dear Dr. Chesley Noon,   Thank you for referring Gail Crawford to our clinic. The following note includes my evaluation and treatment recommendations.  HPI:   Chief Complaint: OBESITY    Gail Crawford has been referred by Dr. Chesley Noon for consultation regarding her obesity and obesity related comorbidities.    Gail Crawford (MR# [id number]) is a 63 y.o. female who presents on 09/05/2018 for obesity evaluation and treatment. Current BMI is Body mass index is 44.26 kg/m. Gail Crawford has been struggling with her weight for many years and has been unsuccessful in either losing weight, maintaining weight loss, or reaching her healthy weight goal.     Gail Crawford attended our information session and states she is currently in the action stage of change and ready to dedicate time achieving and maintaining a healthier weight. Gail Crawford is interested in becoming our patient and working on intensive lifestyle modifications including (but not limited to) diet, exercise and weight loss.  Gail Crawford states her family eats meals together she thinks her family will eat healthier with her her desired weight loss is 106 lbs she has been heavy most of her life she started gaining weight years ago her heaviest weight ever was 270 lbs. she skips breakfast frequently she is frequently drinking liquids with calories she frequently eats larger portions than normal  she has binge eating behaviors she struggles with emotional eating    Fatigue Gail Crawford feels her energy is lower than it should be. This has worsened with weight gain and has not worsened recently. Gail Crawford admits to daytime somnolence and admits to waking up still tired. Patient is at risk for obstructive sleep apnea. Patent has a history of symptoms of daytime fatigue, Epworth sleepiness scale, morning headache and hypertension. Patient generally gets 6 hours of sleep per  night, and states they generally have restless sleep. Snoring is present. Apneic episodes is not present. Epworth Sleepiness Score is 15.  Dyspnea on exertion Gail Crawford notes increasing shortness of breath with exercising and seems to be worsening over time with weight gain. She notes getting out of breath sooner with activity than she used to. This has not gotten worse recently. Gail Crawford denies orthopnea.  Hypertension Gail Crawford is a 63 y.o. female with hypertension. Gail Crawford's blood pressure is elevated today and she is not on blood pressure medications. She feels that is may be white coat hypertension, but she has a history of elevated blood pressure in the past. She is working on weight loss to help control her blood pressure with the goal of decreasing her risk of heart attack and stroke.   Vitamin D deficiency Gail Crawford has a diagnosis of vitamin D deficiency. She is not currently taking vit D and admits fatigue.  Left Ventricular Hypertrophy Per Gail Crawford's EKG, she has a history of elevated blood pressure, obesity, and chemo. She is positive for bilateral lower extremity edema and has shortness of breath with activity, but not at rest.  At risk for cardiovascular disease Gail Crawford is at a higher than average risk for cardiovascular disease due to hypertension, left ventricular hypertrophy, and obesity. She currently denies any chest pain.  Depression Screen Gail Crawford's Food and Mood (modified PHQ-9) score was 14. Depression screen PHQ 2/9 09/05/2018  Decreased Interest 2  Down, Depressed, Hopeless 3  PHQ - 2 Score 5  Altered sleeping 2  Tired, decreased energy 3  Change in appetite 1  Feeling bad or failure about  yourself  1  Trouble concentrating 0  Moving slowly or fidgety/restless 2  Suicidal thoughts 0  PHQ-9 Score 14  Difficult doing work/chores Somewhat difficult    ASSESSMENT AND PLAN:  Other fatigue - Plan: EKG 12-Lead, Vitamin B12, Hemoglobin A1c,  Insulin, random, T3, T4, free, TSH, Folate  Shortness of breath on exertion - Plan: CBC With Differential, Lipid Panel With LDL/HDL Ratio  Essential hypertension - Plan: Comprehensive metabolic panel  Vitamin D deficiency - Plan: VITAMIN D 25 Hydroxy (Vit-D Deficiency, Fractures)  LVH (left ventricular hypertrophy) - Plan: ECHOCARDIOGRAM COMPLETE  Depression screening  At risk for heart disease  Class 3 severe obesity with serious comorbidity and body mass index (BMI) of 40.0 to 44.9 in adult, unspecified obesity type (HCC)  PLAN:  Fatigue Gail Crawford was informed that her fatigue may be related to obesity, depression or many other causes. Labs will be ordered, and in the meanwhile Gail Crawford has agreed to work on diet, exercise and weight loss to help with fatigue. Proper sleep hygiene was discussed including the need for 7-8 hours of quality sleep each night. A sleep study was not ordered based on symptoms and Epworth score. We will order an indirect calorimetry, an EKG, and labs today. She agrees with this plan and will follow up in 2 weeks.  Dyspnea on exertion Gail Crawford's shortness of breath appears to be obesity related and exercise induced. She has agreed to work on weight loss and gradually increase exercise to treat her exercise induced shortness of breath. If Gail Crawford follows our instructions and loses weight without improvement of her shortness of breath, we will plan to refer to pulmonology. We will monitor this condition regularly. An EKG, indirect calorimetry, and labs will be ordered today. Gail Crawford agrees to this plan.  Hypertension We discussed sodium restriction, working on healthy weight loss, and a regular exercise program as the means to achieve improved blood pressure control. We will continue to monitor her blood pressure as well as her progress with the above lifestyle modifications. She will continue her medications as prescribed and will watch for signs of  hypotension as she continues her lifestyle modifications. Gail Crawford agrees to start her diet and exercise and we will order an EKG and labs today. Gail Crawford agreed with this plan and agreed to follow up as directed in 2 weeks.  Vitamin D Deficiency Gail Crawford was informed that low vitamin D levels contributes to fatigue and are associated with obesity, breast, and colon cancer. We will order labs today and she agrees to follow up as directed.  Left Ventricular Hypertrophy We will order an echocardiogram for Gail Crawford and she will follow up in 2 weeks. Yoshika agrees with this plan.  Cardiovascular risk counseling Mliss was given extended (15 minutes) coronary artery disease prevention counseling today. She is 63 y.o. female and has risk factors for heart disease including hypertension, left ventricular hypertrophy, and obesity. We discussed intensive lifestyle modifications today with an emphasis on specific weight loss instructions and strategies. Pt was also informed of the importance of increasing exercise and decreasing saturated fats to help prevent heart disease.  Depression Screen Janaye had a moderately positive depression screening. Depression is commonly associated with obesity and often results in emotional eating behaviors. We will monitor this closely and work on CBT to help improve the non-hunger eating patterns. Referral to Psychology may be required if no improvement is seen as she continues in our clinic.  Obesity Victorian is currently in the action stage of change and her goal  is to continue with weight loss efforts. I recommend Mazel begin the structured treatment plan as follows:  She has agreed to follow the Category 2 plan + 100 calories. Nathalie has been instructed to eventually work up to a goal of 150 minutes of combined cardio and strengthening exercise per week for weight loss and overall health benefits. We discussed the following Behavioral Modification  Strategies today: increasing lean protein intake, decreasing simple carbohydrates, and work on meal planning and easy cooking plans.  She was informed of the importance of frequent follow up visits to maximize her success with intensive lifestyle modifications for her multiple health conditions. She was informed we would discuss her lab results at her next visit unless there is a critical issue that needs to be addressed sooner. Terita agreed to keep her next visit at the agreed upon time to discuss these results.  ALLERGIES: Allergies  Allergen Reactions  . Sulfa Antibiotics Nausea And Vomiting    MEDICATIONS: Current Outpatient Medications on File Prior to Visit  Medication Sig Dispense Refill  . amoxicillin (AMOXIL) 875 MG tablet Take 875 mg by mouth 2 (two) times daily.    . Cholecalciferol (VITAMIN D3) 125 MCG (5000 UT) CAPS Take 1 capsule by mouth daily.    . diclofenac (VOLTAREN) 75 MG EC tablet Take 75 mg by mouth 2 (two) times daily.    . fexofenadine (ALLEGRA) 180 MG tablet Take 180 mg by mouth daily.    . fluticasone (FLONASE) 50 MCG/ACT nasal spray Place into both nostrils daily.    Marland Kitchen guaiFENesin (MUCINEX) 600 MG 12 hr tablet Take 600 mg by mouth 2 (two) times daily.     No current facility-administered medications on file prior to visit.     PAST MEDICAL HISTORY: Past Medical History:  Diagnosis Date  . Asthma 01-2004  . Back pain   . 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 HTN (hypertension)   . Joint pain   . Leg edema     PAST SURGICAL HISTORY: Past Surgical History:  Procedure Laterality Date  . ABDOMINAL HYSTERECTOMY  07-15-04   TYPE II RADICAL HYSTERECTOMY WITH BSO  . [place]  . MASS EXCISION Left 06/11/2015   Procedure: EXCISION LEFT ABDOMINAL WALL MASS 6X3CM;  Surgeon: Stark Klein, MD;  Location: [place];  Service: General;  Laterality: Left;  . ORTHOPEDIC SURGERY     RIGHT SHOULDER AND ELBOW AND RIGHT  FOOT    SOCIAL HISTORY: Social History   Tobacco Use  . Smoking status: Never Smoker  . Smokeless tobacco: Never Used  Substance Use Topics  . Alcohol use: No  . Drug use: No    FAMILY HISTORY: Family History  Problem Relation Age of Onset  . Heart disease Mother   . Heart disease Father    ROS: Review of Systems  Constitutional: Positive for malaise/fatigue. Negative for weight loss.  HENT:       Positive for stuffiness.  Eyes:       Wears glasses or contacts. Positive for floaters.  Respiratory: Positive for shortness of breath ( with activity).   Cardiovascular: Negative for chest pain.       Negative for orthopnea.  Musculoskeletal: Positive for back pain, joint pain and neck pain.       Positive for neck stiffness. Positive for calf and leg pain with walking. Positive for for leg cramping. Positive for muscle pain.  Skin:       Positive for  dryness.   PHYSICAL EXAM: Blood pressure (!) 171/83, pulse 68, temperature 97.8 F (36.6 C), temperature source Oral, height 5\' 5"  (1.651 m), weight 266 lb (120.7 kg), SpO2 99 %. Body mass index is 44.26 kg/m. Physical Exam Vitals signs reviewed.  Constitutional:      Appearance: Normal appearance. She is obese.  HENT:     Head: Normocephalic and atraumatic.     Nose: Nose normal.  Eyes:     General: No scleral icterus.    Extraocular Movements: Extraocular movements intact.  Neck:     Musculoskeletal: Normal range of motion and neck supple.     Comments: Negative for thyromegaly. Cardiovascular:     Rate and Rhythm: Normal rate and regular rhythm.  Pulmonary:     Effort: Pulmonary effort is normal. No respiratory distress.  Abdominal:     Palpations: Abdomen is soft.     Tenderness: There is no abdominal tenderness.     Comments: Positive for obesity.  Musculoskeletal:     Right lower leg: Edema present.     Left lower leg: Edema present.     Comments: ROM normal in all extremities.  Skin:    General:  Skin is warm and dry.  Neurological:     Mental Status: She is alert and oriented to person, place, and time.     Coordination: Coordination normal.  Psychiatric:        Mood and Affect: Mood normal.        Behavior: Behavior normal.    RECENT LABS AND TESTS: BMET    Component Value Date/Time   NA 140 01/15/2012 0908   K 3.8 01/15/2012 0908   CL 105 01/15/2012 0908   CO2 26 01/15/2012 0908   GLUCOSE 92 01/15/2012 0908   BUN 11 01/15/2012 0908   CREATININE 0.67 01/15/2012 0908   CALCIUM 9.0 01/15/2012 0908   No results found for: HGBA1C No results found for: INSULIN CBC    Component Value Date/Time   WBC 6.5 01/15/2012 0908   RBC 4.65 01/15/2012 0908   HGB 11.5 (L) 06/11/2015 0702   HGB 13.6 01/15/2012 0908   HCT 40.9 01/15/2012 0908   PLT 236 01/15/2012 0908   MCV 87.8 01/15/2012 0908   MCH 29.3 01/15/2012 0908   MCHC 33.3 01/15/2012 0908   RDW 13.0 01/15/2012 0908   LYMPHSABS 1.9 01/15/2012 0908   MONOABS 0.4 01/15/2012 0908   EOSABS 0.1 01/15/2012 0908   BASOSABS 0.0 01/15/2012 0908   Iron/TIBC/Ferritin/ %Sat No results found for: IRON, TIBC, FERRITIN, IRONPCTSAT Lipid Panel     Component Value Date/Time   CHOL 157 01/18/2006 0904   TRIG 140 01/18/2006 0904   HDL 39 (L) 01/18/2006 0904   CHOLHDL 4.0 01/18/2006 0904   VLDL 28 01/18/2006 0904   LDLCALC 90 01/18/2006 0904   Hepatic Function Panel     Component Value Date/Time   PROT 6.6 01/15/2012 0908   ALBUMIN 4.1 01/15/2012 0908   AST 16 01/15/2012 0908   ALT 10 01/15/2012 0908   ALKPHOS 92 01/15/2012 0908   BILITOT 0.4 01/15/2012 0908   No results found for: TSH  ECG  shows NSR with a rate of 71 BPM. INDIRECT CALORIMETER done today shows a VO2 of 193 and a REE of 1344.  Her calculated basal metabolic rate is 2229 thus her basal metabolic rate is worse than expected.  OBESITY BEHAVIORAL INTERVENTION VISIT  Today's visit was # 1   Starting weight: 266 lbs Starting date: 09/05/2018  Today's  weight : Weight: 266 lb (120.7 kg)  Today's date: 09/05/2018 Total lbs lost to date: 0  ASK: We discussed the diagnosis of obesity with Sharee Pimple today and Yianna agreed to give [place] permission to discuss obesity behavioral modification therapy today.  ASSESS: Maxcine has the diagnosis of obesity and her BMI today is 44.2. Zareah is in the action stage of change.   ADVISE: Legend was educated on the multiple health risks of obesity as well as the benefit of weight loss to improve her health. She was advised of the need for long term treatment and the importance of lifestyle modifications to improve her current health and to decrease her risk of future health problems.  AGREE: Multiple dietary modification options and treatment options were discussed and Randal agreed to follow the recommendations documented in the above note.  ARRANGE: Raynesha was educated on the importance of frequent visits to treat obesity as outlined per CMS and USPSTF guidelines and agreed to schedule her next follow up appointment today.  I, Marcille Blanco, am acting as transcriptionist for Starlyn Skeans, MD  I have reviewed the above documentation for accuracy and completeness, and I agree with the above. -Dennard Nip, MD

## 2018-09-06 LAB — CBC WITH DIFFERENTIAL
BASOS: 1 %
Basophils Absolute: 0.1 10*3/uL (ref 0.0–0.2)
EOS (ABSOLUTE): 0.1 10*3/uL (ref 0.0–0.4)
Eos: 1 %
Hematocrit: 41.6 % (ref 34.0–46.6)
Hemoglobin: 13.5 g/dL (ref 11.1–15.9)
Immature Grans (Abs): 0 10*3/uL (ref 0.0–0.1)
Immature Granulocytes: 0 %
Lymphocytes Absolute: 2.6 10*3/uL (ref 0.7–3.1)
Lymphs: 28 %
MCH: 28.2 pg (ref 26.6–33.0)
MCHC: 32.5 g/dL (ref 31.5–35.7)
MCV: 87 fL (ref 79–97)
MONOS ABS: 0.7 10*3/uL (ref 0.1–0.9)
Monocytes: 8 %
NEUTROS ABS: 5.5 10*3/uL (ref 1.4–7.0)
NEUTROS PCT: 62 %
RBC: 4.78 x10E6/uL (ref 3.77–5.28)
RDW: 12.8 % (ref 11.7–15.4)
WBC: 9 10*3/uL (ref 3.4–10.8)

## 2018-09-06 LAB — COMPREHENSIVE METABOLIC PANEL
ALT: 14 IU/L (ref 0–32)
AST: 15 IU/L (ref 0–40)
Albumin/Globulin Ratio: 1.5 (ref 1.2–2.2)
Albumin: 4.1 g/dL (ref 3.6–4.8)
Alkaline Phosphatase: 150 IU/L — ABNORMAL HIGH (ref 39–117)
BUN/Creatinine Ratio: 16 (ref 12–28)
BUN: 11 mg/dL (ref 8–27)
Bilirubin Total: 0.3 mg/dL (ref 0.0–1.2)
CO2: 22 mmol/L (ref 20–29)
Calcium: 9.5 mg/dL (ref 8.7–10.3)
Chloride: 102 mmol/L (ref 96–106)
Creatinine, Ser: 0.7 mg/dL (ref 0.57–1.00)
GFR calc Af Amer: 106 mL/min/{1.73_m2} (ref >59)
GFR calc non Af Amer: 92 mL/min/{1.73_m2} (ref >59)
Globulin, Total: 2.8 g/dL (ref 1.5–4.5)
Glucose: 125 mg/dL — ABNORMAL HIGH (ref 65–99)
Potassium: 4.3 mmol/L (ref 3.5–5.2)
Sodium: 142 mmol/L (ref 134–144)
Total Protein: 6.9 g/dL (ref 6.0–8.5)

## 2018-09-06 LAB — VITAMIN D 25 HYDROXY (VIT D DEFICIENCY, FRACTURES): VIT D 25 HYDROXY: 34.3 ng/mL (ref 30.0–100.0)

## 2018-09-06 LAB — TSH: TSH: 1.92 u[IU]/mL (ref 0.450–4.500)

## 2018-09-06 LAB — T3: T3, Total: 116 ng/dL (ref 71–180)

## 2018-09-06 LAB — LIPID PANEL WITH LDL/HDL RATIO
Cholesterol, Total: 217 mg/dL — ABNORMAL HIGH (ref 100–199)
HDL: 36 mg/dL — ABNORMAL LOW (ref >39)
LDL Calculated: 132 mg/dL — ABNORMAL HIGH (ref 0–99)
LDl/HDL Ratio: 3.7 ratio — ABNORMAL HIGH (ref 0.0–3.2)
Triglycerides: 247 mg/dL — ABNORMAL HIGH (ref 0–149)
VLDL CHOLESTEROL CAL: 49 mg/dL — AB (ref 5–40)

## 2018-09-06 LAB — FOLATE: FOLATE: 10.5 ng/mL (ref >3.0)

## 2018-09-06 LAB — T4, FREE: Free T4: 1.27 ng/dL (ref 0.82–1.77)

## 2018-09-06 LAB — INSULIN, RANDOM: INSULIN: 15.8 u[IU]/mL (ref 2.6–24.9)

## 2018-09-06 LAB — HEMOGLOBIN A1C
ESTIMATED AVERAGE GLUCOSE: 157 mg/dL
Hgb A1c MFr Bld: 7.1 % — ABNORMAL HIGH (ref 4.8–5.6)

## 2018-09-06 LAB — VITAMIN B12: Vitamin B-12: 389 pg/mL (ref 232–1245)

## 2018-09-12 ENCOUNTER — Ambulatory Visit (HOSPITAL_COMMUNITY): Payer: Managed Care, Other (non HMO) | Attending: Cardiovascular Disease

## 2018-09-12 ENCOUNTER — Other Ambulatory Visit: Payer: Self-pay

## 2018-09-12 DIAGNOSIS — I517 Cardiomegaly: Secondary | ICD-10-CM | POA: Diagnosis present

## 2018-09-12 DIAGNOSIS — I119 Hypertensive heart disease without heart failure: Secondary | ICD-10-CM | POA: Insufficient documentation

## 2018-09-19 ENCOUNTER — Encounter (INDEPENDENT_AMBULATORY_CARE_PROVIDER_SITE_OTHER): Payer: Self-pay | Admitting: Family Medicine

## 2018-09-19 ENCOUNTER — Ambulatory Visit (INDEPENDENT_AMBULATORY_CARE_PROVIDER_SITE_OTHER): Payer: Managed Care, Other (non HMO) | Admitting: Family Medicine

## 2018-09-19 VITALS — BP 148/74 | HR 67 | Temp 97.7°F | Ht 65.0 in | Wt 260.0 lb

## 2018-09-19 DIAGNOSIS — I10 Essential (primary) hypertension: Secondary | ICD-10-CM

## 2018-09-19 DIAGNOSIS — E782 Mixed hyperlipidemia: Secondary | ICD-10-CM | POA: Diagnosis not present

## 2018-09-19 DIAGNOSIS — Z9189 Other specified personal risk factors, not elsewhere classified: Secondary | ICD-10-CM

## 2018-09-19 DIAGNOSIS — I519 Heart disease, unspecified: Secondary | ICD-10-CM

## 2018-09-19 DIAGNOSIS — E119 Type 2 diabetes mellitus without complications: Secondary | ICD-10-CM

## 2018-09-19 DIAGNOSIS — E559 Vitamin D deficiency, unspecified: Secondary | ICD-10-CM

## 2018-09-19 DIAGNOSIS — Z6841 Body Mass Index (BMI) 40.0 and over, adult: Secondary | ICD-10-CM

## 2018-09-19 DIAGNOSIS — I5189 Other ill-defined heart diseases: Secondary | ICD-10-CM

## 2018-09-19 MED ORDER — VITAMIN D (ERGOCALCIFEROL) 1.25 MG (50000 UNIT) PO CAPS: 50000.0000 [IU] | ORAL_CAPSULE | ORAL | 0 refills | Status: DC

## 2018-09-19 MED ORDER — METFORMIN HCL 500 MG PO TABS: 500.0000 mg | ORAL_TABLET | Freq: Every day | ORAL | 0 refills | Status: DC

## 2018-09-19 MED ORDER — LISINOPRIL 10 MG PO TABS: 10.0000 mg | ORAL_TABLET | Freq: Every day | ORAL | 0 refills | Status: DC

## 2018-09-20 NOTE — Progress Notes (Signed)
Office: [phone]  /  Fax: [phone]   HPI:   Chief Complaint: OBESITY Gail Crawford is here to discuss her progress with her obesity treatment plan. She is on the Category 2 plan + 100 calories and is following her eating plan approximately 100 % of the time. She states she is exercising 0 minutes 0 times per week. Gail Crawford did well with weight loss. She did not like having to prepare 3 meals a day and she is getting bored with the plan.  Her weight is 260 lb (117.9 kg) today and has had a weight loss of 6 pounds over a period of 2 weeks since her last visit. She has lost 6 lbs since starting treatment with [place].  Diabetes II Gail Crawford has a new diagnosis of diabetes type II. Gail Crawford's A1c is elevated at 7.1. She denies a history of pre-diabetes or diabetes mellitus. She notes polyphagia and denies hypoglycemia. She has been working on intensive lifestyle modifications including diet, exercise, and weight loss to help control her blood glucose levels.  Hyperlipidemia (Mixed) Gail Crawford has hyperlipidemia and has been trying to improve her cholesterol levels with intensive lifestyle modification including a low saturated fat diet, exercise and weight loss. Her LDL and triglycerides are elevated and HDL is low. She denies any chest pain, claudication or myalgias. She has a family history of coronary artery disease.  Vitamin D Deficiency Gail Crawford has a diagnosis of vitamin D deficiency. She is on OTC Vit D 5,000 daily, but level is not yet at goal. She denies nausea, vomiting or muscle weakness.  Hypertension Gail Crawford is a 63 y.o. female with hypertension. Gail Crawford's blood pressure has been elevated on multiple visits. She denies chest pain, but notes a family history of coronary artery diease. She is working weight loss to help control her blood pressure with the goal of decreasing her risk of heart attack and stroke. Aviv's blood pressure is not currently  controlled.  Diastolic Dysfunction Grade I Gail Crawford has a new diagnosis of diastolic dysfunction grade I. She has a history of uncontrolled hypertension. She denies chest pain, and she notes family history of congestive heart failure.  At risk for cardiovascular disease Gail Crawford is at a higher than average risk for cardiovascular disease due to obesity. She currently denies any chest pain.  ASSESSMENT AND PLAN:  Type 2 diabetes mellitus without complication, without long-term current use of insulin ([place]) - Plan: metFORMIN (GLUCOPHAGE) 500 MG tablet  Mixed hyperlipidemia  Vitamin D deficiency - Plan: Vitamin D, Ergocalciferol, (DRISDOL) 1.25 MG (50000 UT) CAPS capsule  Essential hypertension - Plan: lisinopril (PRINIVIL,ZESTRIL) 10 MG tablet  Grade I diastolic dysfunction  At risk for diabetes mellitus  Class 3 severe obesity with serious comorbidity and body mass index (BMI) of 40.0 to 44.9 in adult, unspecified obesity type ([place])  PLAN:  Diabetes II Gail Crawford has been given extensive diabetes education by myself today including ideal fasting and post-prandial blood glucose readings, individual ideal Hgb A1c goals and hypoglycemia prevention. We discussed the importance of good blood sugar control to decrease the likelihood of diabetic complications such as nephropathy, neuropathy, limb loss, blindness, coronary artery disease, and death. We discussed the importance of intensive lifestyle modification including diet, exercise and weight loss as the first line treatment for diabetes. Gail Crawford agrees to start metformin 500 mg q AM #30 with no refills. She is to delay checking BGs for now and continue with her diet. We will recheck labs in 3 months. Gail Crawford agrees to follow up  with our clinic in 2 weeks.  Hyperlipidemia (Mixed) Gail Crawford was informed of the American Heart Association Guidelines emphasizing intensive lifestyle modifications as the first line treatment for  hyperlipidemia. We discussed many lifestyle modifications today in depth, and Gail Crawford will continue to work on decreasing saturated fats such as fatty red meat, butter and many fried foods. She will also increase vegetables and lean protein in her diet and continue to work on diet, exercise, and weight loss efforts. We will recheck labs in 3 months, will likely need to start on statin at that time. Gail Crawford agrees to follow up with our clinic in 2 weeks.  Vitamin D Deficiency Gail Crawford was informed that low vitamin D levels contributes to fatigue and are associated with obesity, breast, and colon cancer. Gail Crawford agrees to continue taking OTC Vit D and she agrees to start prescription Vit D @50 ,000 IU every week #4, with no refills. She will follow up for routine testing of vitamin D, at least 2-3 times per year. She was informed of the risk of over-replacement of vitamin D and agrees to not increase her dose unless she discusses this with [place] first. We will recheck labs in 3 months. Gail Crawford agrees to follow up with our clinic in 2 weeks.  Hypertension We discussed sodium restriction, working on healthy weight loss, and a regular exercise program as the means to achieve improved blood pressure control. Gail Crawford agreed with this plan and agreed to follow up as directed. We will continue to monitor her blood pressure as well as her progress with the above lifestyle modifications. Gail Crawford agrees to start lisinopril 10 mg q daily #30 with no refills. She will continue her diet, and will watch for signs of hypotension as she continues her lifestyle modifications. Gail Crawford agrees to follow up with our clinic in 2 weeks and we will recheck blood pressure at that time.  Diastolic Dysfunction Grade I Gail Crawford will continue with diet and exercise to help control blood pressure. Gail Crawford agrees to follow up with our clinic in 2 weeks.  Cardiovascular risk counselling Gail Crawford was given extended (30  minutes) coronary artery disease prevention counseling today. She is 63 y.o. female and has risk factors for heart disease including obesity. We discussed intensive lifestyle modifications today with an emphasis on specific weight loss instructions and strategies. Pt was also informed of the importance of increasing exercise and decreasing saturated fats to help prevent heart disease.  Obesity Gail Crawford is currently in the action stage of change. As such, her goal is to continue with weight loss efforts She has agreed to follow the Category 2 plan + 100 calories Gail Crawford has been instructed to work up to a goal of 150 minutes of combined cardio and strengthening exercise per week for weight loss and overall health benefits. We discussed the following Behavioral Modification Strategies today: increasing lean protein intake, decreasing simple carbohydrates, work on meal planning and easy cooking plans, and no skipping meals   Gail Crawford has agreed to follow up with our clinic in 2 weeks. She was informed of the importance of frequent follow up visits to maximize her success with intensive lifestyle modifications for her multiple health conditions.  ALLERGIES: Allergies  Allergen Reactions  . Sulfa Antibiotics Nausea And Vomiting    MEDICATIONS: Current Outpatient Medications on File Prior to Visit  Medication Sig Dispense Refill  . Cholecalciferol (VITAMIN D3) 125 MCG (5000 UT) CAPS Take 1 capsule by mouth daily.    . diclofenac (VOLTAREN) 75 MG EC tablet Take 75  mg by mouth 2 (two) times daily.    . fexofenadine (ALLEGRA) 180 MG tablet Take 180 mg by mouth daily.    . fluticasone (FLONASE) 50 MCG/ACT nasal spray Place into both nostrils daily.    Marland Kitchen guaiFENesin (MUCINEX) 600 MG 12 hr tablet Take 600 mg by mouth 2 (two) times daily.     No current facility-administered medications on file prior to visit.     PAST MEDICAL HISTORY: Past Medical History:  Diagnosis Date  . Asthma 01-2004   . Back pain   . 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 HTN (hypertension)   . Joint pain   . Leg edema     PAST SURGICAL HISTORY: Past Surgical History:  Procedure Laterality Date  . ABDOMINAL HYSTERECTOMY  07-15-04   TYPE II RADICAL HYSTERECTOMY WITH BSO  . [place]  . MASS EXCISION Left 06/11/2015   Procedure: EXCISION LEFT ABDOMINAL WALL MASS 6X3CM;  Surgeon: Stark Klein, MD;  Location: [facility];  Service: General;  Laterality: Left;  . ORTHOPEDIC SURGERY     RIGHT SHOULDER AND ELBOW AND RIGHT FOOT    SOCIAL HISTORY: Social History   Tobacco Use  . Smoking status: Never Smoker  . Smokeless tobacco: Never Used  Substance Use Topics  . Alcohol use: No  . Drug use: No    FAMILY HISTORY: Family History  Problem Relation Age of Onset  . Heart disease Mother   . Heart disease Father     ROS: Review of Systems  Constitutional: Positive for weight loss.  Cardiovascular: Negative for chest pain and claudication.  Gastrointestinal: Negative for nausea and vomiting.  Musculoskeletal: Negative for myalgias.       Negative muscle weakness  Endo/Heme/Allergies:       Positive polyphagia Negative hypoglycemia    PHYSICAL EXAM: Blood pressure (!) 148/74, pulse 67, temperature 97.7 F (36.5 C), temperature source Oral, height 5\' 5"  (1.651 m), weight 260 lb (117.9 kg), SpO2 99 %. Body mass index is 43.27 kg/m. Physical Exam Vitals signs reviewed.  Constitutional:      Appearance: Normal appearance. She is obese.  Cardiovascular:     Rate and Rhythm: Normal rate.     Pulses: Normal pulses.  Pulmonary:     Effort: Pulmonary effort is normal.     Breath sounds: Normal breath sounds.  Musculoskeletal: Normal range of motion.  Skin:    General: Skin is warm and dry.  Neurological:     Mental Status: She is alert and oriented to person, place, and time.  Psychiatric:        Mood and Affect: Mood normal.        Behavior:  Behavior normal.     RECENT LABS AND TESTS: BMET    Component Value Date/Time   NA 142 09/05/2018 0000   K 4.3 09/05/2018 0000   CL 102 09/05/2018 0000   CO2 22 09/05/2018 0000   GLUCOSE 125 (H) 09/05/2018 0000   GLUCOSE 92 01/15/2012 0908   BUN 11 09/05/2018 0000   CREATININE 0.70 09/05/2018 0000   CALCIUM 9.5 09/05/2018 0000   GFRNONAA 92 09/05/2018 0000   GFRAA 106 09/05/2018 0000   Lab Results  Component Value Date   HGBA1C 7.1 (H) 09/05/2018   Lab Results  Component Value Date   INSULIN 15.8 09/05/2018   CBC    Component Value Date/Time   WBC 9.0 09/05/2018 0000   WBC 6.5 01/15/2012 0908   RBC  4.78 09/05/2018 0000   RBC 4.65 01/15/2012 0908   HGB 13.5 09/05/2018 0000   HGB 13.6 01/15/2012 0908   HCT 41.6 09/05/2018 0000   HCT 40.9 01/15/2012 0908   PLT 236 01/15/2012 0908   MCV 87 09/05/2018 0000   MCV 87.8 01/15/2012 0908   MCH 28.2 09/05/2018 0000   MCH 29.3 01/15/2012 0908   MCHC 32.5 09/05/2018 0000   MCHC 33.3 01/15/2012 0908   RDW 12.8 09/05/2018 0000   RDW 13.0 01/15/2012 0908   LYMPHSABS 2.6 09/05/2018 0000   LYMPHSABS 1.9 01/15/2012 0908   MONOABS 0.4 01/15/2012 0908   EOSABS 0.1 09/05/2018 0000   BASOSABS 0.1 09/05/2018 0000   BASOSABS 0.0 01/15/2012 0908   Iron/TIBC/Ferritin/ %Sat No results found for: IRON, TIBC, FERRITIN, IRONPCTSAT Lipid Panel     Component Value Date/Time   CHOL 217 (H) 09/05/2018 0000   TRIG 247 (H) 09/05/2018 0000   HDL 36 (L) 09/05/2018 0000   CHOLHDL 4.0 01/18/2006 0904   VLDL 28 01/18/2006 0904   LDLCALC 132 (H) 09/05/2018 0000   Hepatic Function Panel     Component Value Date/Time   PROT 6.9 09/05/2018 0000   ALBUMIN 4.1 09/05/2018 0000   AST 15 09/05/2018 0000   ALT 14 09/05/2018 0000   ALKPHOS 150 (H) 09/05/2018 0000   BILITOT 0.3 09/05/2018 0000      Component Value Date/Time   TSH 1.920 09/05/2018 0000      OBESITY BEHAVIORAL INTERVENTION VISIT  Today's visit was # 2   Starting  weight: 266 lbs Starting date: 09/05/2018 Today's weight : 260 lbs  Today's date: 09/19/2018 Total lbs lost to date: 6    ASK: We discussed the diagnosis of obesity with Sharee Pimple today and Jaslyn agreed to give [place] permission to discuss obesity behavioral modification therapy today.  ASSESS: Clotile has the diagnosis of obesity and her BMI today is 43.27 Jalise is in the action stage of change   ADVISE: Jayme was educated on the multiple health risks of obesity as well as the benefit of weight loss to improve her health. She was advised of the need for long term treatment and the importance of lifestyle modifications to improve her current health and to decrease her risk of future health problems.  AGREE: Multiple dietary modification options and treatment options were discussed and  Khamille agreed to follow the recommendations documented in the above note.  ARRANGE: Kairah was educated on the importance of frequent visits to treat obesity as outlined per CMS and USPSTF guidelines and agreed to schedule her next follow up appointment today.  I, Trixie Dredge, am acting as transcriptionist for Dennard Nip, MD  I have reviewed the above documentation for accuracy and completeness, and I agree with the above. -Dennard Nip, MD

## 2018-09-26 ENCOUNTER — Encounter (INDEPENDENT_AMBULATORY_CARE_PROVIDER_SITE_OTHER): Payer: Self-pay | Admitting: Family Medicine

## 2018-10-05 ENCOUNTER — Encounter (INDEPENDENT_AMBULATORY_CARE_PROVIDER_SITE_OTHER): Payer: Self-pay | Admitting: Family Medicine

## 2018-10-05 ENCOUNTER — Ambulatory Visit (INDEPENDENT_AMBULATORY_CARE_PROVIDER_SITE_OTHER): Payer: Managed Care, Other (non HMO) | Admitting: Family Medicine

## 2018-10-05 VITALS — BP 145/79 | HR 64 | Temp 98.0°F | Ht 65.0 in | Wt 250.0 lb

## 2018-10-05 DIAGNOSIS — Z9189 Other specified personal risk factors, not elsewhere classified: Secondary | ICD-10-CM

## 2018-10-05 DIAGNOSIS — E119 Type 2 diabetes mellitus without complications: Secondary | ICD-10-CM | POA: Diagnosis not present

## 2018-10-05 DIAGNOSIS — I10 Essential (primary) hypertension: Secondary | ICD-10-CM | POA: Diagnosis not present

## 2018-10-05 DIAGNOSIS — Z6841 Body Mass Index (BMI) 40.0 and over, adult: Secondary | ICD-10-CM

## 2018-10-06 ENCOUNTER — Encounter (INDEPENDENT_AMBULATORY_CARE_PROVIDER_SITE_OTHER): Payer: Self-pay | Admitting: Family Medicine

## 2018-10-06 ENCOUNTER — Telehealth (INDEPENDENT_AMBULATORY_CARE_PROVIDER_SITE_OTHER): Payer: Self-pay | Admitting: Family Medicine

## 2018-10-06 DIAGNOSIS — E119 Type 2 diabetes mellitus without complications: Secondary | ICD-10-CM | POA: Insufficient documentation

## 2018-10-06 DIAGNOSIS — I10 Essential (primary) hypertension: Secondary | ICD-10-CM | POA: Insufficient documentation

## 2018-10-06 DIAGNOSIS — I152 Hypertension secondary to endocrine disorders: Secondary | ICD-10-CM | POA: Insufficient documentation

## 2018-10-06 DIAGNOSIS — E1159 Type 2 diabetes mellitus with other circulatory complications: Secondary | ICD-10-CM | POA: Insufficient documentation

## 2018-10-06 DIAGNOSIS — E1169 Type 2 diabetes mellitus with other specified complication: Secondary | ICD-10-CM | POA: Insufficient documentation

## 2018-10-06 MED ORDER — LISINOPRIL 20 MG PO TABS: 20.0000 mg | ORAL_TABLET | Freq: Every day | ORAL | 0 refills | Status: DC

## 2018-10-06 MED ORDER — METFORMIN HCL 500 MG PO TABS: 500.0000 mg | ORAL_TABLET | Freq: Every day | ORAL | 0 refills | Status: DC

## 2018-10-06 NOTE — Progress Notes (Signed)
Office: [phone]  /  Fax: [phone]   HPI:   Chief Complaint: OBESITY Gail Crawford is here to discuss her progress with her obesity treatment plan. She is on the Category 2 plan + 100 calories and is following her eating plan approximately 100 % of the time. She states she is exercising 0 minutes 0 times per week. Juniper is tired of breakfast options, such as eggs. She is doing well overall with the plan.  Her weight is 250 lb (113.4 kg) today and has had a weight loss of 10 pounds over a period of 2 weeks since her last visit. She has lost 16 lbs since starting treatment with Gail Crawford.  Diabetes II Gail Crawford has a new diagnosis of diabetes type II. [name]'s last A1c was 7.1 on 09/05/18 and she started metformin at that time. She is tolerating the metformin well at night and she denies hypoglycemia. She has been working on intensive lifestyle modifications including diet, exercise, and weight loss to help control her blood glucose levels.  Hypertension [name] is a 63 y.o. female with hypertension. Berry's blood pressure is not currently well controlled. She recently started on lisinopril and is tolerating it well. She is working on weight loss to help control her blood pressure with the goal of decreasing her risk of heart attack and stroke. Jeremie denies chest pain or shortness of breath on exertion.  At risk for cardiovascular disease Delcie is at a higher than average risk for cardiovascular disease due to diabetes, hypertension, and obesity. She currently denies any chest pain.  ASSESSMENT AND PLAN:  Type 2 diabetes mellitus without complication, without long-term current use of insulin ([place]) - Plan: metFORMIN (GLUCOPHAGE) 500 MG tablet  Essential hypertension - Plan: lisinopril (PRINIVIL,ZESTRIL) 20 MG tablet  At risk for heart disease  Class 3 severe obesity with serious comorbidity and body mass index (BMI) of 40.0 to 44.9 in adult, unspecified obesity  type ([place])  PLAN:  Diabetes II Gail Crawford has been given extensive diabetes education by myself today including ideal fasting and post-prandial blood glucose readings, individual ideal Hgb A1c goals, and hypoglycemia prevention. We discussed the importance of good blood sugar control to decrease the likelihood of diabetic complications such as nephropathy, neuropathy, limb loss, blindness, coronary artery disease, and death. We discussed the importance of intensive lifestyle modification including diet, exercise and weight loss as the first line treatment for diabetes. Gail Crawford agrees to continue her metformin 500mg  qhs #90 with no refills and will follow up at the agreed upon time in 2 weeks.  Hypertension We discussed sodium restriction, working on healthy weight loss, and a regular exercise program as the means to achieve improved blood pressure control. We will continue to monitor her blood pressure as well as her progress with the above lifestyle modifications. She will continue her medications as prescribed and will watch for signs of hypotension as she continues her lifestyle modifications. Gail Crawford agreed to increase her dose of lisinopril to 20mg  daily #90 with no refills and agreed to follow up as directed.  Cardiovascular risk counseling Gail Crawford was given extended (15 minutes) coronary artery disease prevention counseling today. She is 63 y.o. female and has risk factors for heart disease including diabetes, hypertension, and obesity. We discussed intensive lifestyle modifications today with an emphasis on specific weight loss instructions and strategies. Pt was also informed of the importance of increasing exercise and decreasing saturated fats to help prevent heart disease.  Obesity Gail Crawford is currently in the action stage of  change. As such, her goal is to continue with weight loss efforts. She has agreed to follow the Category 2 plan + 100 calories and add breakfast options and  keep a food journal of 300 to 400 calories and 30 grams of protein for lunch. Gail Crawford has not been prescribed exercise at this time. We discussed the following Behavioral Modification Strategies today: work on meal planning and easy cooking plans, planning for success, and keep a strict food journal.  Gail Crawford has agreed to follow up with our clinic in 2 weeks. She was informed of the importance of frequent follow up visits to maximize her success with intensive lifestyle modifications for her multiple health conditions.  ALLERGIES: Allergies  Allergen Reactions  . Sulfa Antibiotics Nausea And Vomiting    MEDICATIONS: Current Outpatient Medications on File Prior to Visit  Medication Sig Dispense Refill  . Cholecalciferol (VITAMIN D3) 125 MCG (5000 UT) CAPS Take 1 capsule by mouth daily.    . diclofenac (VOLTAREN) 75 MG EC tablet Take 75 mg by mouth 2 (two) times daily.    . fexofenadine (ALLEGRA) 180 MG tablet Take 180 mg by mouth daily.    . fluticasone (FLONASE) 50 MCG/ACT nasal spray Place into both nostrils daily.    Marland Kitchen guaiFENesin (MUCINEX) 600 MG 12 hr tablet Take 600 mg by mouth 2 (two) times daily.    . metFORMIN (GLUCOPHAGE) 500 MG tablet Take 1 tablet (500 mg total) by mouth daily with breakfast. 30 tablet 0  . Vitamin D, Ergocalciferol, (DRISDOL) 1.25 MG (50000 UT) CAPS capsule Take 1 capsule (50,000 Units total) by mouth every 7 (seven) days. 4 capsule 0   No current facility-administered medications on file prior to visit.     PAST MEDICAL HISTORY: Past Medical History:  Diagnosis Date  . Asthma 01-2004  . Back pain   . 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 HTN (hypertension)   . Joint pain   . Leg edema     PAST SURGICAL HISTORY: Past Surgical History:  Procedure Laterality Date  . ABDOMINAL HYSTERECTOMY  07-15-04   TYPE II RADICAL HYSTERECTOMY WITH BSO  . [place]  . MASS EXCISION Left 06/11/2015   Procedure: EXCISION LEFT  ABDOMINAL WALL MASS 6X3CM;  Surgeon: Stark Klein, MD;  Location: [facility];  Service: General;  Laterality: Left;  . ORTHOPEDIC SURGERY     RIGHT SHOULDER AND ELBOW AND RIGHT FOOT    SOCIAL HISTORY: Social History   Tobacco Use  . Smoking status: Never Smoker  . Smokeless tobacco: Never Used  Substance Use Topics  . Alcohol use: No  . Drug use: No    FAMILY HISTORY: Family History  Problem Relation Age of Onset  . Heart disease Mother   . Heart disease Father     ROS: Review of Systems  Constitutional: Positive for weight loss.  Respiratory: Negative for shortness of breath.   Cardiovascular: Negative for chest pain.  Endo/Heme/Allergies:       Negative for hypoglycemia.    PHYSICAL EXAM: Blood pressure (!) 145/79, pulse 64, temperature 98 F (36.7 C), temperature source Oral, height 5\' 5"  (1.651 m), weight 250 lb (113.4 kg), SpO2 98 %. Body mass index is 41.6 kg/m. Physical Exam Vitals signs reviewed.  Constitutional:      Appearance: Normal appearance. She is obese.  Cardiovascular:     Rate and Rhythm: Normal rate.  Pulmonary:     Effort: Pulmonary effort is normal.  Musculoskeletal:  Normal range of motion.  Skin:    General: Skin is warm and dry.  Neurological:     Mental Status: She is alert and oriented to person, place, and time.  Psychiatric:        Mood and Affect: Mood normal.        Behavior: Behavior normal.     RECENT LABS AND TESTS: BMET    Component Value Date/Time   NA 142 09/05/2018 0000   K 4.3 09/05/2018 0000   CL 102 09/05/2018 0000   CO2 22 09/05/2018 0000   GLUCOSE 125 (H) 09/05/2018 0000   GLUCOSE 92 01/15/2012 0908   BUN 11 09/05/2018 0000   CREATININE 0.70 09/05/2018 0000   CALCIUM 9.5 09/05/2018 0000   GFRNONAA 92 09/05/2018 0000   GFRAA 106 09/05/2018 0000   Lab Results  Component Value Date   HGBA1C 7.1 (H) 09/05/2018   Lab Results  Component Value Date   INSULIN 15.8 09/05/2018   CBC      Component Value Date/Time   WBC 9.0 09/05/2018 0000   WBC 6.5 01/15/2012 0908   RBC 4.78 09/05/2018 0000   RBC 4.65 01/15/2012 0908   HGB 13.5 09/05/2018 0000   HGB 13.6 01/15/2012 0908   HCT 41.6 09/05/2018 0000   HCT 40.9 01/15/2012 0908   PLT 236 01/15/2012 0908   MCV 87 09/05/2018 0000   MCV 87.8 01/15/2012 0908   MCH 28.2 09/05/2018 0000   MCH 29.3 01/15/2012 0908   MCHC 32.5 09/05/2018 0000   MCHC 33.3 01/15/2012 0908   RDW 12.8 09/05/2018 0000   RDW 13.0 01/15/2012 0908   LYMPHSABS 2.6 09/05/2018 0000   LYMPHSABS 1.9 01/15/2012 0908   MONOABS 0.4 01/15/2012 0908   EOSABS 0.1 09/05/2018 0000   BASOSABS 0.1 09/05/2018 0000   BASOSABS 0.0 01/15/2012 0908   Iron/TIBC/Ferritin/ %Sat No results found for: IRON, TIBC, FERRITIN, IRONPCTSAT Lipid Panel     Component Value Date/Time   CHOL 217 (H) 09/05/2018 0000   TRIG 247 (H) 09/05/2018 0000   HDL 36 (L) 09/05/2018 0000   CHOLHDL 4.0 01/18/2006 0904   VLDL 28 01/18/2006 0904   LDLCALC 132 (H) 09/05/2018 0000   Hepatic Function Panel     Component Value Date/Time   PROT 6.9 09/05/2018 0000   ALBUMIN 4.1 09/05/2018 0000   AST 15 09/05/2018 0000   ALT 14 09/05/2018 0000   ALKPHOS 150 (H) 09/05/2018 0000   BILITOT 0.3 09/05/2018 0000      Component Value Date/Time   TSH 1.920 09/05/2018 0000   Results for ERICHA, WHITTINGHAM (MRN [id number]) as of 10/06/2018 07:07  Ref. Range 09/05/2018 00:00  Vitamin D, 25-Hydroxy Latest Ref Range: 30.0 - 100.0 ng/mL 34.3    OBESITY BEHAVIORAL INTERVENTION VISIT  Today's visit was # 3   Starting weight: 266 lbs Starting date: 09/05/18 Today's weight : Weight: 250 lb (113.4 kg)  Today's date: 10/06/2018 Total lbs lost to date: 16  ASK: We discussed the diagnosis of obesity with Sharee Pimple today and [name] agreed to give Gail Crawford permission to discuss obesity behavioral modification therapy today.  ASSESS: Glena has the diagnosis of obesity and her BMI today is  41.6. Azusena is in the action stage of change.   ADVISE: Laurielle was educated on the multiple health risks of obesity as well as the benefit of weight loss to improve her health. She was advised of the need for long term treatment and the importance of lifestyle modifications  to improve her current health and to decrease her risk of future health problems.  AGREE: Multiple dietary modification options and treatment options were discussed and Brexlee agreed to follow the recommendations documented in the above note.  ARRANGE: Myliyah was educated on the importance of frequent visits to treat obesity as outlined per CMS and USPSTF guidelines and agreed to schedule her next follow up appointment today.  I, Marcille Blanco, CMA, am acting as Location manager for [name], FNP-C.  I have reviewed the above documentation for accuracy and completeness, and I agree with the above.  - Sanaiya Welliver, FNP-C.

## 2018-10-06 NOTE — Telephone Encounter (Signed)
Patient returned call. Request RX given at last visit should be sent to the mail order. If any questions call patient at [phone].

## 2018-10-08 ENCOUNTER — Encounter (INDEPENDENT_AMBULATORY_CARE_PROVIDER_SITE_OTHER): Payer: Self-pay | Admitting: Family Medicine

## 2018-10-11 ENCOUNTER — Encounter (INDEPENDENT_AMBULATORY_CARE_PROVIDER_SITE_OTHER): Payer: Self-pay | Admitting: Family Medicine

## 2018-10-19 ENCOUNTER — Encounter (INDEPENDENT_AMBULATORY_CARE_PROVIDER_SITE_OTHER): Payer: Self-pay | Admitting: Family Medicine

## 2018-10-19 ENCOUNTER — Ambulatory Visit (INDEPENDENT_AMBULATORY_CARE_PROVIDER_SITE_OTHER): Payer: Managed Care, Other (non HMO) | Admitting: Family Medicine

## 2018-10-19 VITALS — BP 159/72 | HR 60 | Temp 98.0°F | Ht 65.0 in | Wt 250.0 lb

## 2018-10-19 DIAGNOSIS — Z6841 Body Mass Index (BMI) 40.0 and over, adult: Secondary | ICD-10-CM | POA: Diagnosis not present

## 2018-10-19 DIAGNOSIS — Z9189 Other specified personal risk factors, not elsewhere classified: Secondary | ICD-10-CM | POA: Diagnosis not present

## 2018-10-19 DIAGNOSIS — I10 Essential (primary) hypertension: Secondary | ICD-10-CM

## 2018-10-19 DIAGNOSIS — E119 Type 2 diabetes mellitus without complications: Secondary | ICD-10-CM | POA: Diagnosis not present

## 2018-10-19 DIAGNOSIS — E559 Vitamin D deficiency, unspecified: Secondary | ICD-10-CM

## 2018-10-19 MED ORDER — VITAMIN D (ERGOCALCIFEROL) 1.25 MG (50000 UNIT) PO CAPS: 50000.0000 [IU] | ORAL_CAPSULE | ORAL | 0 refills | Status: DC

## 2018-10-19 MED ORDER — LOSARTAN POTASSIUM 50 MG PO TABS: 50.0000 mg | ORAL_TABLET | Freq: Every day | ORAL | 0 refills | Status: DC

## 2018-10-19 MED ORDER — METFORMIN HCL 500 MG PO TABS: 500.0000 mg | ORAL_TABLET | Freq: Every day | ORAL | 0 refills | Status: DC

## 2018-10-20 NOTE — Progress Notes (Signed)
Office: [phone]  /  Fax: [phone]   HPI:   Chief Complaint: OBESITY Gail Crawford is here to discuss her progress with her obesity treatment plan. She is on the keep a food journal with 300-400 calories and 30 grams of protein at lunch daily and follow the Category 2 plan + 100 calories and is following her eating plan approximately 100 % of the time. She states she is exercising 0 minutes 0 times per week. Gail Crawford is frustrated by the lack of weight loss today. She is sticking to the plan very well.  Her weight is 250 lb (113.4 kg) today and has not lost weight since her last visit. She has lost 16 lbs since starting treatment with [place].  Hypertension Gail Crawford is a 63 y.o. female with hypertension. BP is elevated today. Gail Crawford's morning blood pressures at home have been 140-150's over 67-79 and evening 130-140 over 60-70. She denies chest pain or shortness of breath. She notes lisinopril causes cough. She is working on weight loss to help control her blood pressure with the goal of decreasing her risk of heart attack and stroke. Gail Crawford's blood pressure is not currently controlled.  Diabetes II Gail Crawford has a diagnosis of diabetes type II. Gail Crawford is on metformin and moderately well controlled. She denies polyphagia or hypoglycemia. Last A1c was 7.1. She has been working on intensive lifestyle modifications including diet, exercise, and weight loss to help control her blood glucose levels.  At risk for cardiovascular disease Gail Crawford is at a higher than average risk for cardiovascular disease due to obesity, hypertension, and diabetes II. She currently denies any chest pain.  Vitamin D Deficiency Gail Crawford has a diagnosis of vitamin D deficiency. She is currently taking prescription Vit D, but level is not at goal. She denies nausea, vomiting or muscle weakness.  ASSESSMENT AND PLAN:  Essential hypertension  Type 2 diabetes mellitus without complication,  without long-term current use of insulin ([place]) - Plan: metFORMIN (GLUCOPHAGE) 500 MG tablet  Vitamin D deficiency - Plan: Vitamin D, Ergocalciferol, (DRISDOL) 1.25 MG (50000 UT) CAPS capsule  At risk for heart disease  Class 3 severe obesity with serious comorbidity and body mass index (BMI) of 40.0 to 44.9 in adult, unspecified obesity type ([name])  PLAN:  Hypertension We discussed sodium restriction, working on healthy weight loss, and a regular exercise program as the means to achieve improved blood pressure control. [name] agreed with this plan and agreed to follow up as directed. We will continue to monitor her blood pressure as well as her progress with the above lifestyle modifications. Gail Crawford agrees to discontinue lisinopril, and she agrees to start losartan 50 mg q AM #30 with no refills. She will watch for signs of hypotension as she continues her lifestyle modifications. Gail Crawford agrees to follow up with our clinic in 2 weeks.  Diabetes II Gail Crawford has been given extensive diabetes education by myself today including ideal fasting and post-prandial blood glucose readings, individual ideal Hgb A1c goals and hypoglycemia prevention. We discussed the importance of good blood sugar control to decrease the likelihood of diabetic complications such as nephropathy, neuropathy, limb loss, blindness, coronary artery disease, and death. We discussed the importance of intensive lifestyle modification including diet, exercise and weight loss as the first line treatment for diabetes. Gail Crawford agrees to continue taking metformin 500 mg q AM #90 with no refills (Patient requests 90 days). Gail Crawford agrees to follow up with our clinic in 2 weeks.  Cardiovascular risk counseling Gail Crawford was given  extended (15 minutes) coronary artery disease prevention counseling today. She is 63 y.o. female and has risk factors for heart disease including obesity, hypertension, and diabetes II. We discussed  intensive lifestyle modifications today with an emphasis on specific weight loss instructions and strategies. Pt was also informed of the importance of increasing exercise and decreasing saturated fats to help prevent heart disease.  Vitamin D Deficiency Gail Crawford was informed that low vitamin D levels contributes to fatigue and are associated with obesity, breast, and colon cancer. Gail Crawford agrees to continue taking prescription Vit D @50 ,000 IU every week #12 with no refills. She will follow up for routine testing of vitamin D, at least 2-3 times per year. She was informed of the risk of over-replacement of vitamin D and agrees to not increase her dose unless she discusses this with [place] first. Gail Crawford agrees to follow up with our clinic in 2 weeks.  Obesity Gail Crawford is currently in the action stage of change. As such, her goal is to continue with weight loss efforts She has agreed to follow the Category 2 plan Gail Crawford has not been prescribed exercise at this time. We discussed the following Behavioral Modification Strategies today: planning for success   Gail Crawford has agreed to follow up with our clinic in 2 weeks. She was informed of the importance of frequent follow up visits to maximize her success with intensive lifestyle modifications for her multiple health conditions.  ALLERGIES: Allergies  Allergen Reactions  . Lisinopril Cough  . Sulfa Antibiotics Nausea And Vomiting    MEDICATIONS: Current Outpatient Medications on File Prior to Visit  Medication Sig Dispense Refill  . Cholecalciferol (VITAMIN D3) 125 MCG (5000 UT) CAPS Take 1 capsule by mouth daily.    . diclofenac (VOLTAREN) 75 MG EC tablet Take 75 mg by mouth 2 (two) times daily.    . fexofenadine (ALLEGRA) 180 MG tablet Take 180 mg by mouth daily.    . fluticasone (FLONASE) 50 MCG/ACT nasal spray Place into both nostrils daily.    Marland Kitchen guaiFENesin (MUCINEX) 600 MG 12 hr tablet Take 600 mg by mouth 2 (two) times daily.      No current facility-administered medications on file prior to visit.     PAST MEDICAL HISTORY: Past Medical History:  Diagnosis Date  . Asthma 01-2004  . Back pain   . 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 HTN (hypertension)   . Joint pain   . Leg edema     PAST SURGICAL HISTORY: Past Surgical History:  Procedure Laterality Date  . ABDOMINAL HYSTERECTOMY  07-15-04   TYPE II RADICAL HYSTERECTOMY WITH BSO  . [place]  . MASS EXCISION Left 06/11/2015   Procedure: EXCISION LEFT ABDOMINAL WALL MASS 6X3CM;  Surgeon: Stark Klein, MD;  Location: [facility];  Service: General;  Laterality: Left;  . ORTHOPEDIC SURGERY     RIGHT SHOULDER AND ELBOW AND RIGHT FOOT    SOCIAL HISTORY: Social History   Tobacco Use  . Smoking status: Never Smoker  . Smokeless tobacco: Never Used  Substance Use Topics  . Alcohol use: No  . Drug use: No    FAMILY HISTORY: Family History  Problem Relation Age of Onset  . Heart disease Mother   . Heart disease Father     ROS: Review of Systems  Constitutional: Negative for weight loss.  Respiratory: Negative for shortness of breath.   Cardiovascular: Negative for chest pain.  Gastrointestinal: Negative for nausea and vomiting.  Musculoskeletal:       Negative muscle weakness  Endo/Heme/Allergies:       Negative polyphagia Negative hypoglycemia    PHYSICAL EXAM: Blood pressure (!) 159/72, pulse 60, temperature 98 F (36.7 C), height 5\' 5"  (1.651 m), weight 250 lb (113.4 kg), SpO2 99 %. Body mass index is 41.6 kg/m. Physical Exam Vitals signs reviewed.  Constitutional:      Appearance: Normal appearance. She is obese.  Cardiovascular:     Rate and Rhythm: Normal rate.     Pulses: Normal pulses.  Pulmonary:     Effort: Pulmonary effort is normal.     Breath sounds: Normal breath sounds.  Musculoskeletal: Normal range of motion.  Skin:    General: Skin is warm and dry.  Neurological:       Mental Status: She is alert and oriented to person, place, and time.  Psychiatric:        Mood and Affect: Mood normal.        Behavior: Behavior normal.     RECENT LABS AND TESTS: BMET    Component Value Date/Time   NA 142 09/05/2018 0000   K 4.3 09/05/2018 0000   CL 102 09/05/2018 0000   CO2 22 09/05/2018 0000   GLUCOSE 125 (H) 09/05/2018 0000   GLUCOSE 92 01/15/2012 0908   BUN 11 09/05/2018 0000   CREATININE 0.70 09/05/2018 0000   CALCIUM 9.5 09/05/2018 0000   GFRNONAA 92 09/05/2018 0000   GFRAA 106 09/05/2018 0000   Lab Results  Component Value Date   HGBA1C 7.1 (H) 09/05/2018   Lab Results  Component Value Date   INSULIN 15.8 09/05/2018   CBC    Component Value Date/Time   WBC 9.0 09/05/2018 0000   WBC 6.5 01/15/2012 0908   RBC 4.78 09/05/2018 0000   RBC 4.65 01/15/2012 0908   HGB 13.5 09/05/2018 0000   HGB 13.6 01/15/2012 0908   HCT 41.6 09/05/2018 0000   HCT 40.9 01/15/2012 0908   PLT 236 01/15/2012 0908   MCV 87 09/05/2018 0000   MCV 87.8 01/15/2012 0908   MCH 28.2 09/05/2018 0000   MCH 29.3 01/15/2012 0908   MCHC 32.5 09/05/2018 0000   MCHC 33.3 01/15/2012 0908   RDW 12.8 09/05/2018 0000   RDW 13.0 01/15/2012 0908   LYMPHSABS 2.6 09/05/2018 0000   LYMPHSABS 1.9 01/15/2012 0908   MONOABS 0.4 01/15/2012 0908   EOSABS 0.1 09/05/2018 0000   BASOSABS 0.1 09/05/2018 0000   BASOSABS 0.0 01/15/2012 0908   Iron/TIBC/Ferritin/ %Sat No results found for: IRON, TIBC, FERRITIN, IRONPCTSAT Lipid Panel     Component Value Date/Time   CHOL 217 (H) 09/05/2018 0000   TRIG 247 (H) 09/05/2018 0000   HDL 36 (L) 09/05/2018 0000   CHOLHDL 4.0 01/18/2006 0904   VLDL 28 01/18/2006 0904   LDLCALC 132 (H) 09/05/2018 0000   Hepatic Function Panel     Component Value Date/Time   PROT 6.9 09/05/2018 0000   ALBUMIN 4.1 09/05/2018 0000   AST 15 09/05/2018 0000   ALT 14 09/05/2018 0000   ALKPHOS 150 (H) 09/05/2018 0000   BILITOT 0.3 09/05/2018 0000       Component Value Date/Time   TSH 1.920 09/05/2018 0000      OBESITY BEHAVIORAL INTERVENTION VISIT  Today's visit was # 4   Starting weight: 266 lbs Starting date: 09/05/2018 Today's weight : 250 lbs  Today's date: 10/19/2018 Total lbs lost to date: 16    ASK: We discussed  the diagnosis of obesity with Sharee Pimple today and Tamberly agreed to give [place] permission to discuss obesity behavioral modification therapy today.  ASSESS: Jasnoor has the diagnosis of obesity and her BMI today is 41.6 Charitie is in the action stage of change   ADVISE: Adisynn was educated on the multiple health risks of obesity as well as the benefit of weight loss to improve her health. She was advised of the need for long term treatment and the importance of lifestyle modifications to improve her current health and to decrease her risk of future health problems.  AGREE: Multiple dietary modification options and treatment options were discussed and  Roxanne agreed to follow the recommendations documented in the above note.  ARRANGE: Reighn was educated on the importance of frequent visits to treat obesity as outlined per CMS and USPSTF guidelines and agreed to schedule her next follow up appointment today.  Wilhemena Durie, am acting as Location manager for [name], FNP-C.  I have reviewed the above documentation for accuracy and completeness, and I agree with the above.  -  , FNP-C.

## 2018-10-24 ENCOUNTER — Encounter (INDEPENDENT_AMBULATORY_CARE_PROVIDER_SITE_OTHER): Payer: Self-pay | Admitting: Family Medicine

## 2018-10-24 DIAGNOSIS — E559 Vitamin D deficiency, unspecified: Secondary | ICD-10-CM | POA: Insufficient documentation

## 2018-11-03 ENCOUNTER — Encounter (INDEPENDENT_AMBULATORY_CARE_PROVIDER_SITE_OTHER): Payer: Self-pay | Admitting: Family Medicine

## 2018-11-03 ENCOUNTER — Ambulatory Visit (INDEPENDENT_AMBULATORY_CARE_PROVIDER_SITE_OTHER): Payer: Managed Care, Other (non HMO) | Admitting: Family Medicine

## 2018-11-03 ENCOUNTER — Ambulatory Visit (INDEPENDENT_AMBULATORY_CARE_PROVIDER_SITE_OTHER): Payer: Managed Care, Other (non HMO) | Admitting: Physician Assistant

## 2018-11-03 VITALS — BP 157/81 | HR 63 | Ht 65.0 in | Wt 240.0 lb

## 2018-11-03 DIAGNOSIS — E559 Vitamin D deficiency, unspecified: Secondary | ICD-10-CM

## 2018-11-03 DIAGNOSIS — Z6839 Body mass index (BMI) 39.0-39.9, adult: Secondary | ICD-10-CM

## 2018-11-03 DIAGNOSIS — Z9189 Other specified personal risk factors, not elsewhere classified: Secondary | ICD-10-CM

## 2018-11-03 DIAGNOSIS — I10 Essential (primary) hypertension: Secondary | ICD-10-CM

## 2018-11-03 MED ORDER — HYDROCHLOROTHIAZIDE 12.5 MG PO TABS
12.5000 mg | ORAL_TABLET | Freq: Every day | ORAL | 0 refills | Status: DC
Start: 2018-11-03 — End: 2019-01-12

## 2018-11-03 NOTE — Progress Notes (Signed)
Office: [phone]  /  Fax: [phone]   HPI:   Chief Complaint: OBESITY Gail Crawford is here to discuss her progress with her obesity treatment plan. She is on the Category 2 plan and is following her eating plan approximately 95% of the time. She states she is exercising 0 minutes 0 times per week. [name] continues to do very well with weight loss. She is tolerating her plan well and notes hunger is mostly controlled. She reports she gets good support from her husband. Her weight is 240 lb (108.9 kg) today and has had a weight loss of 10 pounds over a period of 2 weeks since her last visit. She has lost 26 lbs since starting treatment with [place].  Hypertension Gail Crawford is a 63 y.o. female with hypertension.  Gail Crawford denies chest pain or shortness of breath on exertion. She is working weight loss to help control her blood pressure with the goal of decreasing her risk of heart attack and stroke. [name]'s blood pressure is elevated today even on losartan and states this hasn't been at goal since starting with [place]. She is doing well with diet and weight loss.  At risk for cardiovascular disease [name] is at a higher than average risk for cardiovascular disease due to obesity. She currently denies any chest pain.  Vitamin D deficiency [name] has a diagnosis of Vitamin D deficiency and is not yet at goal. She is currently taking Vit D and denies nausea, vomiting or muscle weakness.  ASSESSMENT AND PLAN:  Essential hypertension - Plan: hydrochlorothiazide (HYDRODIURIL) 12.5 MG tablet  Vitamin D deficiency  At risk for heart disease  Class 2 severe obesity with serious comorbidity and body mass index (BMI) of 39.0 to 39.9 in adult, unspecified obesity type ([place])  PLAN:  Hypertension We discussed sodium restriction, working on healthy weight loss, and a regular exercise program as the means to achieve improved blood pressure control. [name] agreed with  this plan and agreed to follow up as directed. We will continue to monitor her blood pressure as well as her progress with the above lifestyle modifications. [name] will start HCTZ 12.5 mg qd #30. We will plan to recheck her blood pressure in 2-3 weeks. She will watch for signs of hypotension as she continues her lifestyle modifications.  Cardiovascular risk counseling [name] was given extended (15 minutes) coronary artery disease prevention counseling today. She is 63 y.o. female and has risk factors for heart disease including obesity. We discussed intensive lifestyle modifications today with an emphasis on specific weight loss instructions and strategies. Pt was also informed of the importance of increasing exercise and decreasing saturated fats to help prevent heart disease.  Vitamin D Deficiency Gail Crawford was informed that low Vitamin D levels contributes to fatigue and are associated with obesity, breast, and colon cancer. She agrees to continue taking Vit D and will follow-up for routine testing of Vitamin D next month. She was informed of the risk of over-replacement of Vitamin D and agrees to not increase her dose unless she discusses this with [place] first. [name] agrees to follow-up with our clinic in 2-3 weeks.  Obesity Gail Crawford is currently in the action stage of change. As such, her goal is to continue with weight loss efforts. She has agreed to follow the Category 2 plan. Gail Crawford has been instructed to work up to a goal of 150 minutes of combined cardio and strengthening exercise per week for weight loss and overall health benefits. We discussed the following  Behavioral Modification Strategies today: decrease eating out and no skipping meals.  Gail Crawford has agreed to follow-up with our clinic in 2-3 weeks. She was informed of the importance of frequent follow up visits to maximize her success with intensive lifestyle modifications for her multiple health  conditions.  ALLERGIES: Allergies  Allergen Reactions  . Lisinopril Cough  . Sulfa Antibiotics Nausea And Vomiting    MEDICATIONS: Current Outpatient Medications on File Prior to Visit  Medication Sig Dispense Refill  . amoxicillin-clavulanate (AUGMENTIN) 875-125 MG tablet Take 1 tablet by mouth 2 (two) times daily.    . Cholecalciferol (VITAMIN D3) 125 MCG (5000 UT) CAPS Take 1 capsule by mouth daily.    . diclofenac (VOLTAREN) 75 MG EC tablet Take 75 mg by mouth 2 (two) times daily.    . fexofenadine (ALLEGRA) 180 MG tablet Take 180 mg by mouth daily.    . fluticasone (FLONASE) 50 MCG/ACT nasal spray Place into both nostrils daily.    Marland Kitchen guaiFENesin (MUCINEX) 600 MG 12 hr tablet Take 600 mg by mouth 2 (two) times daily.    Marland Kitchen losartan (COZAAR) 50 MG tablet Take 1 tablet (50 mg total) by mouth daily. 90 tablet 0  . metFORMIN (GLUCOPHAGE) 500 MG tablet Take 1 tablet (500 mg total) by mouth daily with breakfast. 90 tablet 0  . Vitamin D, Ergocalciferol, (DRISDOL) 1.25 MG (50000 UT) CAPS capsule Take 1 capsule (50,000 Units total) by mouth every 7 (seven) days. 12 capsule 0   No current facility-administered medications on file prior to visit.     PAST MEDICAL HISTORY: Past Medical History:  Diagnosis Date  . Asthma 01-2004  . Back pain   . 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 HTN (hypertension)   . Joint pain   . Leg edema     PAST SURGICAL HISTORY: Past Surgical History:  Procedure Laterality Date  . ABDOMINAL HYSTERECTOMY  07-15-04   TYPE II RADICAL HYSTERECTOMY WITH BSO  . [place]  . MASS EXCISION Left 06/11/2015   Procedure: EXCISION LEFT ABDOMINAL WALL MASS 6X3CM;  Surgeon: Stark Klein, MD;  Location: [facility];  Service: General;  Laterality: Left;  . ORTHOPEDIC SURGERY     RIGHT SHOULDER AND ELBOW AND RIGHT FOOT    SOCIAL HISTORY: Social History   Tobacco Use  . Smoking status: Never Smoker  . Smokeless tobacco:  Never Used  Substance Use Topics  . Alcohol use: No  . Drug use: No    FAMILY HISTORY: Family History  Problem Relation Age of Onset  . Heart disease Mother   . Heart disease Father    ROS: Review of Systems  Constitutional: Positive for weight loss.  Gastrointestinal: Negative for nausea and vomiting.  Musculoskeletal:       Negative for muscle weakness.  Endo/Heme/Allergies:       Negative for hypoglycemia.   PHYSICAL EXAM: Blood pressure (!) 157/81, pulse 63, height 5\' 5"  (1.651 m), weight 240 lb (108.9 kg), SpO2 100 %. Body mass index is 39.94 kg/m. Physical Exam Vitals signs reviewed.  Constitutional:      Appearance: Normal appearance. She is obese.  Cardiovascular:     Rate and Rhythm: Normal rate.     Pulses: Normal pulses.  Pulmonary:     Effort: Pulmonary effort is normal.     Breath sounds: Normal breath sounds.  Musculoskeletal: Normal range of motion.  Skin:    General: Skin is warm and dry.  Neurological:  Mental Status: She is alert and oriented to person, place, and time.  Psychiatric:        Behavior: Behavior normal.   RECENT LABS AND TESTS: BMET    Component Value Date/Time   NA 142 09/05/2018 0000   K 4.3 09/05/2018 0000   CL 102 09/05/2018 0000   CO2 22 09/05/2018 0000   GLUCOSE 125 (H) 09/05/2018 0000   GLUCOSE 92 01/15/2012 0908   BUN 11 09/05/2018 0000   CREATININE 0.70 09/05/2018 0000   CALCIUM 9.5 09/05/2018 0000   GFRNONAA 92 09/05/2018 0000   GFRAA 106 09/05/2018 0000   Lab Results  Component Value Date   HGBA1C 7.1 (H) 09/05/2018   Lab Results  Component Value Date   INSULIN 15.8 09/05/2018   CBC    Component Value Date/Time   WBC 9.0 09/05/2018 0000   WBC 6.5 01/15/2012 0908   RBC 4.78 09/05/2018 0000   RBC 4.65 01/15/2012 0908   HGB 13.5 09/05/2018 0000   HGB 13.6 01/15/2012 0908   HCT 41.6 09/05/2018 0000   HCT 40.9 01/15/2012 0908   PLT 236 01/15/2012 0908   MCV 87 09/05/2018 0000   MCV 87.8 01/15/2012  0908   MCH 28.2 09/05/2018 0000   MCH 29.3 01/15/2012 0908   MCHC 32.5 09/05/2018 0000   MCHC 33.3 01/15/2012 0908   RDW 12.8 09/05/2018 0000   RDW 13.0 01/15/2012 0908   LYMPHSABS 2.6 09/05/2018 0000   LYMPHSABS 1.9 01/15/2012 0908   MONOABS 0.4 01/15/2012 0908   EOSABS 0.1 09/05/2018 0000   BASOSABS 0.1 09/05/2018 0000   BASOSABS 0.0 01/15/2012 0908   Iron/TIBC/Ferritin/ %Sat No results found for: IRON, TIBC, FERRITIN, IRONPCTSAT Lipid Panel     Component Value Date/Time   CHOL 217 (H) 09/05/2018 0000   TRIG 247 (H) 09/05/2018 0000   HDL 36 (L) 09/05/2018 0000   CHOLHDL 4.0 01/18/2006 0904   VLDL 28 01/18/2006 0904   LDLCALC 132 (H) 09/05/2018 0000   Hepatic Function Panel     Component Value Date/Time   PROT 6.9 09/05/2018 0000   ALBUMIN 4.1 09/05/2018 0000   AST 15 09/05/2018 0000   ALT 14 09/05/2018 0000   ALKPHOS 150 (H) 09/05/2018 0000   BILITOT 0.3 09/05/2018 0000      Component Value Date/Time   TSH 1.920 09/05/2018 0000    Ref. Range 09/05/2018 00:00  Vitamin D, 25-Hydroxy Latest Ref Range: 30.0 - 100.0 ng/mL 34.3   OBESITY BEHAVIORAL INTERVENTION VISIT  Today's visit was #5  Starting weight: 266 lbs Starting date: 09/05/2018 Today's weight: 240 lbs Today's date: 11/03/2018 Total lbs lost to date: 26    11/03/2018  Height 5\' 5"  (1.651 m)  Weight 240 lb (108.9 kg)  BMI (Calculated) 39.94  BLOOD PRESSURE - SYSTOLIC 008  BLOOD PRESSURE - DIASTOLIC 81   Body Fat % 67.6 %  Total Body Water (lbs) 84 lbs   ASK: We discussed the diagnosis of obesity with Sharee Pimple today and Moet agreed to give [place] permission to discuss obesity behavioral modification therapy today.  ASSESS: Takeya has the diagnosis of obesity and her BMI today is 39.94. Tazia is in the action stage of change.   ADVISE: Jakari was educated on the multiple health risks of obesity as well as the benefit of weight loss to improve her health. She was advised of the  need for long term treatment and the importance of lifestyle modifications to improve her current health and to decrease  her risk of future health problems.  AGREE: Multiple dietary modification options and treatment options were discussed and  Skyleen agreed to follow the recommendations documented in the above note.  ARRANGE: Halona was educated on the importance of frequent visits to treat obesity as outlined per CMS and USPSTF guidelines and agreed to schedule her next follow up appointment today.  I, Michaelene Song, am acting as Location manager for Dennard Nip, MD  I have reviewed the above documentation for accuracy and completeness, and I agree with the above. -Dennard Nip, MD

## 2018-11-24 ENCOUNTER — Other Ambulatory Visit: Payer: Self-pay

## 2018-11-24 ENCOUNTER — Encounter (INDEPENDENT_AMBULATORY_CARE_PROVIDER_SITE_OTHER): Payer: Self-pay | Admitting: Family Medicine

## 2018-11-24 ENCOUNTER — Ambulatory Visit (INDEPENDENT_AMBULATORY_CARE_PROVIDER_SITE_OTHER): Payer: Managed Care, Other (non HMO) | Admitting: Family Medicine

## 2018-11-24 ENCOUNTER — Encounter (INDEPENDENT_AMBULATORY_CARE_PROVIDER_SITE_OTHER): Payer: Self-pay

## 2018-11-24 DIAGNOSIS — Z6839 Body mass index (BMI) 39.0-39.9, adult: Secondary | ICD-10-CM

## 2018-11-24 DIAGNOSIS — E1165 Type 2 diabetes mellitus with hyperglycemia: Secondary | ICD-10-CM

## 2018-11-24 NOTE — Progress Notes (Signed)
Office: [phone]  /  Fax: [phone] TeleHealth Visit:  Gail Crawford has consented to this TeleHealth visit today via Facetime. The patient is located at work, the provider is located at the [employer] [facility] office. The participants in this visit include the listed provider and patient. Time spent on visit was 12 minutes.  HPI:   Chief Complaint: OBESITY Gail Crawford is here to discuss her progress with her obesity treatment plan. She is on the Category 2 plan and is following her eating plan approximately 95 % of the time. She states she is exercising 0 minutes 0 times per week. 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 She has done well with meal prepping and stocking up on healthy food options.   We were unable to weight the patient today for this TeleHealth visit. She feels as if she has lost about 3 pounds since her last visit. She has lost 26 lbs since starting treatment with [place].  Diabetes II Gail Crawford has a diagnosis of diabetes type II. Gail Crawford was started on metformin and does not check her blood sugars at home, and she denies any hypoglycemic episodes. Her last A1c was elevated at 7.1 on 09/05/18, but she is doing much better with her diet. She has been working on intensive lifestyle modifications including diet, exercise, and weight loss to help control her blood glucose levels.  ASSESSMENT AND PLAN:  Type 2 diabetes mellitus with hyperglycemia, without long-term current use of insulin (HCC)  Class 2 severe obesity with serious comorbidity and body mass index (BMI) of 39.0 to 39.9 in adult, unspecified obesity type ([place])  PLAN:  Diabetes II Gail Crawford has been given extensive diabetes education by myself today including ideal fasting and post-prandial blood glucose readings, individual ideal Hgb A1c goals, and hypoglycemia prevention. We discussed the importance of good blood sugar  control to decrease the likelihood of diabetic complications such as nephropathy, neuropathy, limb loss, blindness, coronary artery disease, and death. We discussed the importance of intensive lifestyle modification including diet, exercise, and weight loss as the first line treatment for diabetes. Gail Crawford agrees to continue her diet, exercise, and metformin. We will recheck labs next month. She will follow up at the agreed upon time in   I spent > than 50% of the 15 minute visit on counseling as documented in the note.  Obesity Gail Crawford is currently in the action stage of change. As such, her goal is to continue with weight loss efforts. She has agreed to follow the Category 2 plan. Gail Crawford has been instructed to work up to a goal of 150 minutes of combined cardio and strengthening exercise per week for weight loss and overall health benefits. We discussed the following Behavioral Modification Strategies today: increasing vegetables, work on meal planning and easy cooking plans, emotional eating strategies, ways to avoid boredom eating, keeping healthy foods in the home, better snacking choices, and ways to avoid night time snacking.  Gail Crawford has agreed to follow up with our clinic in 2 weeks. She was informed of the importance of frequent follow up visits to maximize her success with intensive lifestyle modifications for her multiple health conditions.  ALLERGIES: Allergies  Allergen Reactions  . Lisinopril Cough  . Sulfa Antibiotics Nausea And Vomiting    MEDICATIONS: Current Outpatient Medications on File Prior to Visit  Medication Sig Dispense Refill  . amoxicillin-clavulanate (AUGMENTIN) 875-125 MG tablet Take 1 tablet by mouth 2 (  two) times daily.    . Cholecalciferol (VITAMIN D3) 125 MCG (5000 UT) CAPS Take 1 capsule by mouth daily.    . diclofenac (VOLTAREN) 75 MG EC tablet Take 75 mg by mouth 2 (two) times daily.    . fexofenadine (ALLEGRA) 180 MG tablet Take 180 mg by  mouth daily.    . fluticasone (FLONASE) 50 MCG/ACT nasal spray Place into both nostrils daily.    Marland Kitchen guaiFENesin (MUCINEX) 600 MG 12 hr tablet Take 600 mg by mouth 2 (two) times daily.    . hydrochlorothiazide (HYDRODIURIL) 12.5 MG tablet Take 1 tablet (12.5 mg total) by mouth daily. 30 tablet 0  . losartan (COZAAR) 50 MG tablet Take 1 tablet (50 mg total) by mouth daily. 90 tablet 0  . metFORMIN (GLUCOPHAGE) 500 MG tablet Take 1 tablet (500 mg total) by mouth daily with breakfast. 90 tablet 0  . Vitamin D, Ergocalciferol, (DRISDOL) 1.25 MG (50000 UT) CAPS capsule Take 1 capsule (50,000 Units total) by mouth every 7 (seven) days. 12 capsule 0   No current facility-administered medications on file prior to visit.     PAST MEDICAL HISTORY: Past Medical History:  Diagnosis Date  . Asthma 01-2004  . Back pain   . 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 HTN (hypertension)   . Joint pain   . Leg edema     PAST SURGICAL HISTORY: Past Surgical History:  Procedure Laterality Date  . ABDOMINAL HYSTERECTOMY  07-15-04   TYPE II RADICAL HYSTERECTOMY WITH BSO  . [place]  . MASS EXCISION Left 06/11/2015   Procedure: EXCISION LEFT ABDOMINAL WALL MASS 6X3CM;  Surgeon: Stark Klein, MD;  Location: [facility];  Service: General;  Laterality: Left;  . ORTHOPEDIC SURGERY     RIGHT SHOULDER AND ELBOW AND RIGHT FOOT    SOCIAL HISTORY: Social History   Tobacco Use  . Smoking status: Never Smoker  . Smokeless tobacco: Never Used  Substance Use Topics  . Alcohol use: No  . Drug use: No    FAMILY HISTORY: Family History  Problem Relation Age of Onset  . Heart disease Mother   . Heart disease Father    ROS: Review of Systems  Constitutional: Positive for weight loss.  Endo/Heme/Allergies:       Negative for hypoglycemia.   PHYSICAL EXAM: Pt in no acute distress  RECENT LABS AND TESTS: BMET    Component Value Date/Time   NA 142 09/05/2018  0000   K 4.3 09/05/2018 0000   CL 102 09/05/2018 0000   CO2 22 09/05/2018 0000   GLUCOSE 125 (H) 09/05/2018 0000   GLUCOSE 92 01/15/2012 0908   BUN 11 09/05/2018 0000   CREATININE 0.70 09/05/2018 0000   CALCIUM 9.5 09/05/2018 0000   GFRNONAA 92 09/05/2018 0000   GFRAA 106 09/05/2018 0000   Lab Results  Component Value Date   HGBA1C 7.1 (H) 09/05/2018   Lab Results  Component Value Date   INSULIN 15.8 09/05/2018   CBC    Component Value Date/Time   WBC 9.0 09/05/2018 0000   WBC 6.5 01/15/2012 0908   RBC 4.78 09/05/2018 0000   RBC 4.65 01/15/2012 0908   HGB 13.5 09/05/2018 0000   HGB 13.6 01/15/2012 0908   HCT 41.6 09/05/2018 0000   HCT 40.9 01/15/2012 0908   PLT 236 01/15/2012 0908   MCV 87 09/05/2018 0000   MCV 87.8 01/15/2012 0908   MCH 28.2 09/05/2018 0000  MCH 29.3 01/15/2012 0908   MCHC 32.5 09/05/2018 0000   MCHC 33.3 01/15/2012 0908   RDW 12.8 09/05/2018 0000   RDW 13.0 01/15/2012 0908   LYMPHSABS 2.6 09/05/2018 0000   LYMPHSABS 1.9 01/15/2012 0908   MONOABS 0.4 01/15/2012 0908   EOSABS 0.1 09/05/2018 0000   BASOSABS 0.1 09/05/2018 0000   BASOSABS 0.0 01/15/2012 0908   Iron/TIBC/Ferritin/ %Sat No results found for: IRON, TIBC, FERRITIN, IRONPCTSAT Lipid Panel     Component Value Date/Time   CHOL 217 (H) 09/05/2018 0000   TRIG 247 (H) 09/05/2018 0000   HDL 36 (L) 09/05/2018 0000   CHOLHDL 4.0 01/18/2006 0904   VLDL 28 01/18/2006 0904   LDLCALC 132 (H) 09/05/2018 0000   Hepatic Function Panel     Component Value Date/Time   PROT 6.9 09/05/2018 0000   ALBUMIN 4.1 09/05/2018 0000   AST 15 09/05/2018 0000   ALT 14 09/05/2018 0000   ALKPHOS 150 (H) 09/05/2018 0000   BILITOT 0.3 09/05/2018 0000      Component Value Date/Time   TSH 1.920 09/05/2018 0000   Results for COREN, CROWNOVER (MRN [id number]) as of 11/24/2018 11:17  Ref. Range 09/05/2018 00:00  Vitamin D, 25-Hydroxy Latest Ref Range: 30.0 - 100.0 ng/mL 34.3     I, Marcille Blanco, CMA,  am acting as transcriptionist for Starlyn Skeans, MD I have reviewed the above documentation for accuracy and completeness, and I agree with the above. -Dennard Nip, MD

## 2018-11-30 ENCOUNTER — Encounter (INDEPENDENT_AMBULATORY_CARE_PROVIDER_SITE_OTHER): Payer: Self-pay | Admitting: Family Medicine

## 2018-11-30 NOTE — Telephone Encounter (Signed)
Please advise. Pt has an appt on 12/08/18.

## 2018-12-08 ENCOUNTER — Ambulatory Visit (INDEPENDENT_AMBULATORY_CARE_PROVIDER_SITE_OTHER): Payer: Managed Care, Other (non HMO) | Admitting: Family Medicine

## 2018-12-08 ENCOUNTER — Encounter (INDEPENDENT_AMBULATORY_CARE_PROVIDER_SITE_OTHER): Payer: Self-pay | Admitting: Family Medicine

## 2018-12-08 ENCOUNTER — Other Ambulatory Visit: Payer: Self-pay

## 2018-12-08 DIAGNOSIS — I10 Essential (primary) hypertension: Secondary | ICD-10-CM

## 2018-12-08 DIAGNOSIS — F3289 Other specified depressive episodes: Secondary | ICD-10-CM | POA: Diagnosis not present

## 2018-12-08 DIAGNOSIS — Z6839 Body mass index (BMI) 39.0-39.9, adult: Secondary | ICD-10-CM

## 2018-12-08 MED ORDER — BUPROPION HCL ER (SR) 150 MG PO TB12: 150.0000 mg | ORAL_TABLET | Freq: Every day | ORAL | 0 refills | Status: DC

## 2018-12-12 NOTE — Progress Notes (Signed)
Office: [phone]  /  Fax: [phone] TeleHealth Visit:  Gail Crawford has verbally consented to this TeleHealth visit today. The patient is located at home, the provider is located at the [employer] [facility] office. The participants in this visit include the listed provider and patient and provider's assistant. Gail Crawford was unable to use realtime audiovisual technology today and the telehealth visit was conducted via telephone.   HPI:   Chief Complaint: OBESITY Gail Crawford is here to discuss her progress with her obesity treatment plan. She is on the Category 2 plan and is following her eating plan approximately 98 % of the time. She states she is exercising 0 minutes 0 times per week. Face time failed due to weak wifi connection. Gail Crawford has done well with weight loss. She states she has lost in the last month. She is struggling with pm cravings and feeling a bit out of control, worse with isolation.  We were unable to weigh the patient today for this TeleHealth visit. She feels as if she has lost 5 lbs since her last visit. She has lost 26 lbs since starting treatment with [place].  Hypertension Gail Crawford is a 63 y.o. female with hypertension. Gail Crawford's blood pressure has been elevated in our office recently. She is taking it at home and is seeing 120's/70's with her new machine. She is working on her diet and doing well with weight loss to help control her blood pressure with the goal of decreasing her risk of heart attack and stroke.   Depression with Emotional Eating Behaviors Gail Crawford notes increased cravings and emotional eating, worse in the last month and worse with COVID-19 isolation and increased boredom. Gail Crawford struggles with emotional eating and using food for comfort to the extent that it is negatively impacting her health. She often snacks when she is not hungry. Gail Crawford sometimes feels she is out of control and then feels guilty that she made poor  food choices. She has been working on behavior modification techniques to help reduce her emotional eating and has been somewhat successful. She shows no sign of suicidal or homicidal ideations.  Depression screen PHQ 2/9 09/05/2018  Decreased Interest 2  Down, Depressed, Hopeless 3  PHQ - 2 Score 5  Altered sleeping 2  Tired, decreased energy 3  Change in appetite 1  Feeling bad or failure about yourself  1  Trouble concentrating 0  Moving slowly or fidgety/restless 2  Suicidal thoughts 0  PHQ-9 Score 14  Difficult doing work/chores Somewhat difficult    ASSESSMENT AND PLAN:  Essential hypertension  Other depression - with emotional eating  - Plan: buPROPion (WELLBUTRIN SR) 150 MG 12 hr tablet  Class 2 severe obesity with serious comorbidity and body mass index (BMI) of 39.0 to 39.9 in adult, unspecified obesity type (Gail Crawford)  PLAN:  Hypertension We discussed sodium restriction, working on healthy weight loss, and a regular exercise program as the means to achieve improved blood pressure control. Gail Crawford agreed with this plan and agreed to follow up as directed. We will continue to monitor her blood pressure as well as her progress with the above lifestyle modifications. She will continue her medications and will watch for signs of hypotension as she continues her lifestyle modifications. She will continue with diet and exercise, and will check her blood pressure at home 2-3 times per week. Gail Crawford agrees to follow up with our clinic in 3 weeks.  Depression with Emotional Eating Behaviors We discussed behavior modification techniques today to  help Gail Crawford deal with her emotional eating and depression. Gail Crawford agrees to start Wellbutrin SR 150 mg q AM #30 with no refills. Gail Crawford agrees to follow up with our clinic in 3 weeks.  Obesity Gail Crawford is currently in the action stage of change. As such, her goal is to continue with weight loss efforts She has agreed to follow the  Category 2 plan Gail Crawford has been instructed to work up to a goal of 150 minutes of combined cardio and strengthening exercise per week for weight loss and overall health benefits. We discussed the following Behavioral Modification Strategies today: decreasing simple carbohydrates, increasing vegetables, and keeping healthy foods in the home   Gail Crawford has agreed to follow up with our clinic in 3 weeks. She was informed of the importance of frequent follow up visits to maximize her success with intensive lifestyle modifications for her multiple health conditions.  ALLERGIES: Allergies  Allergen Reactions  . Lisinopril Cough  . Sulfa Antibiotics Nausea And Vomiting    MEDICATIONS: Current Outpatient Medications on File Prior to Visit  Medication Sig Dispense Refill  . amoxicillin-clavulanate (AUGMENTIN) 875-125 MG tablet Take 1 tablet by mouth 2 (two) times daily.    . Cholecalciferol (VITAMIN D3) 125 MCG (5000 UT) CAPS Take 1 capsule by mouth daily.    . diclofenac (VOLTAREN) 75 MG EC tablet Take 75 mg by mouth 2 (two) times daily.    . fexofenadine (ALLEGRA) 180 MG tablet Take 180 mg by mouth daily.    . fluticasone (FLONASE) 50 MCG/ACT nasal spray Place into both nostrils daily.    Marland Kitchen guaiFENesin (MUCINEX) 600 MG 12 hr tablet Take 600 mg by mouth 2 (two) times daily.    . hydrochlorothiazide (HYDRODIURIL) 12.5 MG tablet Take 1 tablet (12.5 mg total) by mouth daily. 30 tablet 0  . losartan (COZAAR) 50 MG tablet Take 1 tablet (50 mg total) by mouth daily. 90 tablet 0  . metFORMIN (GLUCOPHAGE) 500 MG tablet Take 1 tablet (500 mg total) by mouth daily with breakfast. 90 tablet 0  . Vitamin D, Ergocalciferol, (DRISDOL) 1.25 MG (50000 UT) CAPS capsule Take 1 capsule (50,000 Units total) by mouth every 7 (seven) days. 12 capsule 0   No current facility-administered medications on file prior to visit.     PAST MEDICAL HISTORY: Past Medical History:  Diagnosis Date  . Asthma 01-2004  .  Back pain   . 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 HTN (hypertension)   . Joint pain   . Leg edema     PAST SURGICAL HISTORY: Past Surgical History:  Procedure Laterality Date  . ABDOMINAL HYSTERECTOMY  07-15-04   TYPE II RADICAL HYSTERECTOMY WITH BSO  . [place]  . MASS EXCISION Left 06/11/2015   Procedure: EXCISION LEFT ABDOMINAL WALL MASS 6X3CM;  Surgeon: Stark Klein, MD;  Location: [facility];  Service: General;  Laterality: Left;  . ORTHOPEDIC SURGERY     RIGHT SHOULDER AND ELBOW AND RIGHT FOOT    SOCIAL HISTORY: Social History   Tobacco Use  . Smoking status: Never Smoker  . Smokeless tobacco: Never Used  Substance Use Topics  . Alcohol use: No  . Drug use: No    FAMILY HISTORY: Family History  Problem Relation Age of Onset  . Heart disease Mother   . Heart disease Father     ROS: Review of Systems  Constitutional: Positive for weight loss.  Psychiatric/Behavioral: Positive for depression. Negative for suicidal ideas.  PHYSICAL EXAM: Pt in no acute distress  RECENT LABS AND TESTS: BMET    Component Value Date/Time   NA 142 09/05/2018 0000   K 4.3 09/05/2018 0000   CL 102 09/05/2018 0000   CO2 22 09/05/2018 0000   GLUCOSE 125 (H) 09/05/2018 0000   GLUCOSE 92 01/15/2012 0908   BUN 11 09/05/2018 0000   CREATININE 0.70 09/05/2018 0000   CALCIUM 9.5 09/05/2018 0000   GFRNONAA 92 09/05/2018 0000   GFRAA 106 09/05/2018 0000   Lab Results  Component Value Date   HGBA1C 7.1 (H) 09/05/2018   Lab Results  Component Value Date   INSULIN 15.8 09/05/2018   CBC    Component Value Date/Time   WBC 9.0 09/05/2018 0000   WBC 6.5 01/15/2012 0908   RBC 4.78 09/05/2018 0000   RBC 4.65 01/15/2012 0908   HGB 13.5 09/05/2018 0000   HGB 13.6 01/15/2012 0908   HCT 41.6 09/05/2018 0000   HCT 40.9 01/15/2012 0908   PLT 236 01/15/2012 0908   MCV 87 09/05/2018 0000   MCV 87.8 01/15/2012 0908   MCH 28.2  09/05/2018 0000   MCH 29.3 01/15/2012 0908   MCHC 32.5 09/05/2018 0000   MCHC 33.3 01/15/2012 0908   RDW 12.8 09/05/2018 0000   RDW 13.0 01/15/2012 0908   LYMPHSABS 2.6 09/05/2018 0000   LYMPHSABS 1.9 01/15/2012 0908   MONOABS 0.4 01/15/2012 0908   EOSABS 0.1 09/05/2018 0000   BASOSABS 0.1 09/05/2018 0000   BASOSABS 0.0 01/15/2012 0908   Iron/TIBC/Ferritin/ %Sat No results found for: IRON, TIBC, FERRITIN, IRONPCTSAT Lipid Panel     Component Value Date/Time   CHOL 217 (H) 09/05/2018 0000   TRIG 247 (H) 09/05/2018 0000   HDL 36 (L) 09/05/2018 0000   CHOLHDL 4.0 01/18/2006 0904   VLDL 28 01/18/2006 0904   LDLCALC 132 (H) 09/05/2018 0000   Hepatic Function Panel     Component Value Date/Time   PROT 6.9 09/05/2018 0000   ALBUMIN 4.1 09/05/2018 0000   AST 15 09/05/2018 0000   ALT 14 09/05/2018 0000   ALKPHOS 150 (H) 09/05/2018 0000   BILITOT 0.3 09/05/2018 0000      Component Value Date/Time   TSH 1.920 09/05/2018 0000      I, Trixie Dredge, am acting as transcriptionist for Dennard Nip, MD I have reviewed the above documentation for accuracy and completeness, and I agree with the above. -Dennard Nip, MD

## 2018-12-29 ENCOUNTER — Ambulatory Visit (INDEPENDENT_AMBULATORY_CARE_PROVIDER_SITE_OTHER): Payer: Managed Care, Other (non HMO) | Admitting: Family Medicine

## 2018-12-29 ENCOUNTER — Encounter (INDEPENDENT_AMBULATORY_CARE_PROVIDER_SITE_OTHER): Payer: Self-pay | Admitting: Family Medicine

## 2018-12-29 ENCOUNTER — Other Ambulatory Visit: Payer: Self-pay

## 2018-12-29 DIAGNOSIS — E7849 Other hyperlipidemia: Secondary | ICD-10-CM | POA: Diagnosis not present

## 2018-12-29 DIAGNOSIS — F3289 Other specified depressive episodes: Secondary | ICD-10-CM | POA: Diagnosis not present

## 2018-12-29 DIAGNOSIS — E1165 Type 2 diabetes mellitus with hyperglycemia: Secondary | ICD-10-CM

## 2018-12-29 DIAGNOSIS — E559 Vitamin D deficiency, unspecified: Secondary | ICD-10-CM | POA: Diagnosis not present

## 2018-12-29 DIAGNOSIS — Z6839 Body mass index (BMI) 39.0-39.9, adult: Secondary | ICD-10-CM

## 2018-12-29 DIAGNOSIS — I10 Essential (primary) hypertension: Secondary | ICD-10-CM

## 2018-12-29 MED ORDER — BUPROPION HCL ER (SR) 150 MG PO TB12: 150.0000 mg | ORAL_TABLET | Freq: Every day | ORAL | 0 refills | Status: DC

## 2018-12-29 NOTE — Progress Notes (Signed)
Office: [phone]  /  Fax: [phone] TeleHealth Visit:  Gail Crawford has verbally consented to this TeleHealth visit today. The patient is located at work, the provider is located at the [employer] [facility] office. The participants in this visit include the listed provider and patient. Lajune was unable to use Face Time today and the telehealth visit was conducted via telephone.  HPI:   Chief Complaint: OBESITY Gail Crawford is here to discuss her progress with her obesity treatment plan. She is on the Category 2 plan and is following her eating plan approximately 90 % of the time. She states she is exercising 0 minutes 0 times per week. Gail Crawford feels that she is continuing to lose weight. She has lost about 10 pounds since our last visit in the office. She is doing well following her plan, but sometimes eats an extra 100 calorie snack. Gail Crawford is not exercising yet.  We were unable to weigh the patient today for this TeleHealth visit. She feels as if she has lost weight since her last visit. She has lost 26 lbs since starting treatment with [place].  Depression with emotional eating behaviors Gail Crawford started Wellbutrin and feels that is actually made her more tired. She has done better with emotional eating though and using food for comfort to the extent that it is negatively impacting her health. She often snacks when she is not hungry. Gail Crawford sometimes feels she is out of control and then feels guilty that she made poor food choices. She has been working on behavior modification techniques to help reduce her emotional eating and has been somewhat successful. She notes no change in her blood pressure and denies insomnia.  Diabetes II with Hyperglycemia Gail Crawford has a diagnosis of diabetes type II. Gail Crawford does not check sugars at home, but is doing well on her diet, weight loss, and medications. Her last A1c was 7.1 on 09/05/18. She has been working on intensive lifestyle  modifications including diet, exercise, and weight loss to help control her blood glucose levels.  Hyperlipidemia [name] has hyperlipidemia and is not on a statin. She has a history of diabetes and hypertension. Her ASCVD risk is elevated at 23%. She is attempting to improve her cholesterol levels with intensive lifestyle modification including a low saturated fat diet, exercise, and weight loss. She denies any chest pain or myalgias.  Vitamin D Deficiency Gail Crawford has a diagnosis of vitamin D deficiency. She is currently stable on vit D and her last level was at goal. Gail Crawford denies nausea, vomiting, or muscle weakness.  Hypertension Gail Crawford is a 63 y.o. female with hypertension. Gail Crawford's blood pressure at home was 144/78 this morning and is not yet controlled on medications and diet. She is working on weight loss to help control her blood pressure with the goal of decreasing her risk of heart attack and stroke. Gail Crawford denies chest pain or headache.  ASSESSMENT AND PLAN:  Type 2 diabetes mellitus with hyperglycemia, without long-term current use of insulin ([place]) - Plan: Comprehensive metabolic panel, Hemoglobin A1c, Insulin, random  Other depression - with emotional eating - Plan: buPROPion (WELLBUTRIN SR) 150 MG 12 hr tablet  Other hyperlipidemia - Plan: Lipid Panel With LDL/HDL Ratio  Vitamin D deficiency - Plan: VITAMIN D 25 Hydroxy (Vit-D Deficiency, Fractures)  Essential hypertension  Other depression - with emotional eating  - Plan: buPROPion (WELLBUTRIN SR) 150 MG 12 hr tablet  Class 2 severe obesity with serious comorbidity and body mass index (BMI) of 39.0  to 39.9 in adult, unspecified obesity type ([place])  PLAN:  Diabetes II with Hyperglycemia Gail Crawford has been given extensive diabetes education by myself today including ideal fasting and post-prandial blood glucose readings, individual ideal Hgb A1c goals, and hypoglycemia prevention. We discussed the  importance of good blood sugar control to decrease the likelihood of diabetic complications such as nephropathy, neuropathy, limb loss, blindness, coronary artery disease, and death. We discussed the importance of intensive lifestyle modification including diet, exercise, and weight loss as the first line treatment for diabetes. Labs were ordered today. Gail Crawford agrees to continue her diet and diabetes medications and will follow up at the agreed upon time in 2 weeks.  Hyperlipidemia Gail Crawford was informed of the American Heart Association Guidelines emphasizing intensive lifestyle modifications as the first line treatment for hyperlipidemia. We discussed many lifestyle modifications today in depth, and Gail Crawford will continue to work on decreasing saturated fats such as fatty red meat, butter and many fried foods. She will also increase vegetables and lean protein in her diet and continue to work on exercise and weight loss efforts. We will order labs today to see what improvements have been made and recalculate her ASCVD risk. We will likely need to start a statin even with her significant weight loss. Carissa agrees to follow up as directed.  Vitamin D Deficiency Gail Crawford was informed that low vitamin D levels contribute to fatigue and are associated with obesity, breast, and colon cancer. Gail Crawford agrees to continue to take prescription Vit D @50 ,000 IU every week and will follow up for routine testing of vitamin D, at least 2-3 times per year. She was informed of the risk of over-replacement of vitamin D and agrees to not increase her dose unless she discusses this with [place] first. Labs were ordered and Gail Crawford agrees to follow up in 2 weeks as directed.  Hypertension We discussed sodium restriction, working on healthy weight loss, and a regular exercise program as the means to achieve improved blood pressure control. We will continue to monitor her blood pressure as well as her progress with the  above lifestyle modifications. She will continue her diet and medications as prescribed and we may need to adjust her dose if the is no improvement by her next visit. She will watch for signs of hypotension as she continues her lifestyle modifications. We will check labs. Carriann agreed with this plan and agreed to follow up as directed.  Depression with Emotional Eating Behaviors We discussed behavior modification techniques today to help Chryl deal with her emotional eating and depression. She has agreed to change to take Wellbutrin SR 150 mg qhs #30 and agreed to follow up as directed.  Obesity Zanyla is currently in the action stage of change. As such, her goal is to continue with weight loss efforts. She has agreed to follow the Category 2 plan. Dorothey has been instructed to start some exercises with a goal of 15 minutes per day 5 times a week. We discussed the following Behavioral Modification Strategies today: decreasing simple carbohydrates, emotional eating strategies, and ways to avoid boredom eating.  Julieann has agreed to follow up with our clinic in 2 weeks. She was informed of the importance of frequent follow up visits to maximize her success with intensive lifestyle modifications for her multiple health conditions.  ALLERGIES: Allergies  Allergen Reactions  . Lisinopril Cough  . Sulfa Antibiotics Nausea And Vomiting    MEDICATIONS: Current Outpatient Medications on File Prior to Visit  Medication Sig Dispense Refill  .  amoxicillin-clavulanate (AUGMENTIN) 875-125 MG tablet Take 1 tablet by mouth 2 (two) times daily.    . Cholecalciferol (VITAMIN D3) 125 MCG (5000 UT) CAPS Take 1 capsule by mouth daily.    . diclofenac (VOLTAREN) 75 MG EC tablet Take 75 mg by mouth 2 (two) times daily.    . fexofenadine (ALLEGRA) 180 MG tablet Take 180 mg by mouth daily.    . fluticasone (FLONASE) 50 MCG/ACT nasal spray Place into both nostrils daily.    Marland Kitchen guaiFENesin  (MUCINEX) 600 MG 12 hr tablet Take 600 mg by mouth 2 (two) times daily.    . hydrochlorothiazide (HYDRODIURIL) 12.5 MG tablet Take 1 tablet (12.5 mg total) by mouth daily. 30 tablet 0  . losartan (COZAAR) 50 MG tablet Take 1 tablet (50 mg total) by mouth daily. 90 tablet 0  . metFORMIN (GLUCOPHAGE) 500 MG tablet Take 1 tablet (500 mg total) by mouth daily with breakfast. 90 tablet 0  . Vitamin D, Ergocalciferol, (DRISDOL) 1.25 MG (50000 UT) CAPS capsule Take 1 capsule (50,000 Units total) by mouth every 7 (seven) days. 12 capsule 0   No current facility-administered medications on file prior to visit.     PAST MEDICAL HISTORY: Past Medical History:  Diagnosis Date  . Asthma 01-2004  . Back pain   . 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 HTN (hypertension)   . Joint pain   . Leg edema     PAST SURGICAL HISTORY: Past Surgical History:  Procedure Laterality Date  . ABDOMINAL HYSTERECTOMY  07-15-04   TYPE II RADICAL HYSTERECTOMY WITH BSO  . [place]  . MASS EXCISION Left 06/11/2015   Procedure: EXCISION LEFT ABDOMINAL WALL MASS 6X3CM;  Surgeon: Stark Klein, MD;  Location: [facility];  Service: General;  Laterality: Left;  . ORTHOPEDIC SURGERY     RIGHT SHOULDER AND ELBOW AND RIGHT FOOT    SOCIAL HISTORY: Social History   Tobacco Use  . Smoking status: Never Smoker  . Smokeless tobacco: Never Used  Substance Use Topics  . Alcohol use: No  . Drug use: No    FAMILY HISTORY: Family History  Problem Relation Age of Onset  . Heart disease Mother   . Heart disease Father     ROS: Review of Systems  Cardiovascular: Negative for chest pain.  Gastrointestinal: Negative for nausea and vomiting.  Musculoskeletal: Negative for myalgias.       Negative for muscle weakness.  Neurological: Negative for headaches.  Endo/Heme/Allergies:       Positive for hyperglycemia.  Psychiatric/Behavioral: Positive for depression. The patient does  not have insomnia.     PHYSICAL EXAM: Pt in no acute distress  RECENT LABS AND TESTS: BMET    Component Value Date/Time   NA 142 09/05/2018 0000   K 4.3 09/05/2018 0000   CL 102 09/05/2018 0000   CO2 22 09/05/2018 0000   GLUCOSE 125 (H) 09/05/2018 0000   GLUCOSE 92 01/15/2012 0908   BUN 11 09/05/2018 0000   CREATININE 0.70 09/05/2018 0000   CALCIUM 9.5 09/05/2018 0000   GFRNONAA 92 09/05/2018 0000   GFRAA 106 09/05/2018 0000   Lab Results  Component Value Date   HGBA1C 7.1 (H) 09/05/2018   Lab Results  Component Value Date   INSULIN 15.8 09/05/2018   CBC    Component Value Date/Time   WBC 9.0 09/05/2018 0000   WBC 6.5 01/15/2012 0908   RBC 4.78 09/05/2018 0000   RBC 4.65  01/15/2012 0908   HGB 13.5 09/05/2018 0000   HGB 13.6 01/15/2012 0908   HCT 41.6 09/05/2018 0000   HCT 40.9 01/15/2012 0908   PLT 236 01/15/2012 0908   MCV 87 09/05/2018 0000   MCV 87.8 01/15/2012 0908   MCH 28.2 09/05/2018 0000   MCH 29.3 01/15/2012 0908   MCHC 32.5 09/05/2018 0000   MCHC 33.3 01/15/2012 0908   RDW 12.8 09/05/2018 0000   RDW 13.0 01/15/2012 0908   LYMPHSABS 2.6 09/05/2018 0000   LYMPHSABS 1.9 01/15/2012 0908   MONOABS 0.4 01/15/2012 0908   EOSABS 0.1 09/05/2018 0000   BASOSABS 0.1 09/05/2018 0000   BASOSABS 0.0 01/15/2012 0908   Iron/TIBC/Ferritin/ %Sat No results found for: IRON, TIBC, FERRITIN, IRONPCTSAT Lipid Panel     Component Value Date/Time   CHOL 217 (H) 09/05/2018 0000   TRIG 247 (H) 09/05/2018 0000   HDL 36 (L) 09/05/2018 0000   CHOLHDL 4.0 01/18/2006 0904   VLDL 28 01/18/2006 0904   LDLCALC 132 (H) 09/05/2018 0000   Hepatic Function Panel     Component Value Date/Time   PROT 6.9 09/05/2018 0000   ALBUMIN 4.1 09/05/2018 0000   AST 15 09/05/2018 0000   ALT 14 09/05/2018 0000   ALKPHOS 150 (H) 09/05/2018 0000   BILITOT 0.3 09/05/2018 0000      Component Value Date/Time   TSH 1.920 09/05/2018 0000   Results for MUSKAN, BOLLA (MRN  [id number]) as of 12/29/2018 10:44  Ref. Range 09/05/2018 00:00  Vitamin D, 25-Hydroxy Latest Ref Range: 30.0 - 100.0 ng/mL 34.3     I, Marcille Blanco, CMA, am acting as transcriptionist for Starlyn Skeans, MD I have reviewed the above documentation for accuracy and completeness, and I agree with the above. -Dennard Nip, MD

## 2019-01-04 LAB — COMPREHENSIVE METABOLIC PANEL
ALT: 12 IU/L (ref 0–32)
AST: 16 IU/L (ref 0–40)
Albumin/Globulin Ratio: 1.7 (ref 1.2–2.2)
Albumin: 4.4 g/dL (ref 3.8–4.8)
Alkaline Phosphatase: 127 IU/L — ABNORMAL HIGH (ref 39–117)
BUN/Creatinine Ratio: 18 (ref 12–28)
BUN: 14 mg/dL (ref 8–27)
Bilirubin Total: 0.3 mg/dL (ref 0.0–1.2)
CO2: 24 mmol/L (ref 20–29)
Calcium: 9.4 mg/dL (ref 8.7–10.3)
Chloride: 101 mmol/L (ref 96–106)
Creatinine, Ser: 0.8 mg/dL (ref 0.57–1.00)
GFR calc Af Amer: 90 mL/min/{1.73_m2} (ref >59)
GFR calc non Af Amer: 78 mL/min/{1.73_m2} (ref >59)
Globulin, Total: 2.6 g/dL (ref 1.5–4.5)
Glucose: 99 mg/dL (ref 65–99)
Potassium: 4.7 mmol/L (ref 3.5–5.2)
Sodium: 141 mmol/L (ref 134–144)
Total Protein: 7 g/dL (ref 6.0–8.5)

## 2019-01-04 LAB — HEMOGLOBIN A1C
Est. average glucose Bld gHb Est-mCnc: 120 mg/dL
Hgb A1c MFr Bld: 5.8 % — ABNORMAL HIGH (ref 4.8–5.6)

## 2019-01-04 LAB — VITAMIN D 25 HYDROXY (VIT D DEFICIENCY, FRACTURES): Vit D, 25-Hydroxy: 75.1 ng/mL (ref 30.0–100.0)

## 2019-01-04 LAB — LIPID PANEL WITH LDL/HDL RATIO
Cholesterol, Total: 177 mg/dL (ref 100–199)
HDL: 39 mg/dL — ABNORMAL LOW (ref >39)
LDL Calculated: 101 mg/dL — ABNORMAL HIGH (ref 0–99)
LDl/HDL Ratio: 2.6 ratio (ref 0.0–3.2)
Triglycerides: 185 mg/dL — ABNORMAL HIGH (ref 0–149)
VLDL Cholesterol Cal: 37 mg/dL (ref 5–40)

## 2019-01-04 LAB — INSULIN, RANDOM: INSULIN: 13.3 u[IU]/mL (ref 2.6–24.9)

## 2019-01-12 ENCOUNTER — Other Ambulatory Visit: Payer: Self-pay

## 2019-01-12 ENCOUNTER — Ambulatory Visit (INDEPENDENT_AMBULATORY_CARE_PROVIDER_SITE_OTHER): Payer: Managed Care, Other (non HMO) | Admitting: Family Medicine

## 2019-01-12 ENCOUNTER — Encounter (INDEPENDENT_AMBULATORY_CARE_PROVIDER_SITE_OTHER): Payer: Self-pay | Admitting: Family Medicine

## 2019-01-12 DIAGNOSIS — E559 Vitamin D deficiency, unspecified: Secondary | ICD-10-CM

## 2019-01-12 DIAGNOSIS — F3289 Other specified depressive episodes: Secondary | ICD-10-CM

## 2019-01-12 DIAGNOSIS — I10 Essential (primary) hypertension: Secondary | ICD-10-CM | POA: Diagnosis not present

## 2019-01-12 DIAGNOSIS — E119 Type 2 diabetes mellitus without complications: Secondary | ICD-10-CM | POA: Diagnosis not present

## 2019-01-12 DIAGNOSIS — Z6839 Body mass index (BMI) 39.0-39.9, adult: Secondary | ICD-10-CM

## 2019-01-12 MED ORDER — VITAMIN D (ERGOCALCIFEROL) 1.25 MG (50000 UNIT) PO CAPS: 50000.0000 [IU] | ORAL_CAPSULE | ORAL | 0 refills | Status: DC

## 2019-01-12 MED ORDER — HYDROCHLOROTHIAZIDE 12.5 MG PO TABS: 12.5000 mg | ORAL_TABLET | Freq: Every day | ORAL | 0 refills | Status: DC

## 2019-01-12 MED ORDER — METFORMIN HCL 500 MG PO TABS: 500.0000 mg | ORAL_TABLET | Freq: Every day | ORAL | 0 refills | Status: DC

## 2019-01-12 MED ORDER — LOSARTAN POTASSIUM 50 MG PO TABS: 50.0000 mg | ORAL_TABLET | Freq: Every day | ORAL | 0 refills | Status: DC

## 2019-01-12 MED ORDER — BUPROPION HCL ER (SR) 150 MG PO TB12: 150.0000 mg | ORAL_TABLET | Freq: Every day | ORAL | 0 refills | Status: DC

## 2019-01-16 NOTE — Progress Notes (Signed)
Office: [phone]  /  Fax: [phone] TeleHealth Visit:  Gail Crawford has verbally consented to this TeleHealth visit today. The patient is located at work, the provider is located at the [employer] [facility] office. The participants in this visit include the listed provider and patient. Gail Crawford was unable to use Face Time today and the telehealth visit was conducted via telephone.  HPI:   Chief Complaint: OBESITY Gail Crawford is here to discuss her progress with her obesity treatment plan. She is on the Category 2 plan and is following her eating plan approximately 95 % of the time. She states she is walking 20 minutes 3 times per week. Gail Crawford feels that she has continued to lose weight on her Category 2 plan. She is still working most days and this has helped to decrease daytime snacking.  We were unable to weigh the patient today for this TeleHealth visit. She feels as if she has lost weight since her last visit. She has lost 26 lbs since starting treatment with [place].  Vitamin D Deficiency Gail Crawford has a diagnosis of vitamin D deficiency. She is currently stable on prescription and OTC vit D,and she is now at goal. Gail Crawford denies nausea, vomiting, or muscle weakness.  Hypertension Gail Crawford is a 63 y.o. female with hypertension. Gail Crawford's blood pressure is elevated today at home, but she thinks this was artificially higher after finding ants in the bathroom. She is working on weight loss to help control her blood pressure with the goal of decreasing her risk of heart attack and stroke. Gail Crawford denies chest pain, or headache.  Diabetes II Gail Crawford has a diagnosis of diabetes type II. Gail Crawford's last A1c was 5.8 on 01/03/19 and was greatly improved from 7.1. She is on metformin and has been working on intensive lifestyle modifications including diet, exercise, and weight loss to help control her blood glucose levels. She denies any nausea, vomiting, or  hypoglycemic episodes.   Depression with emotional eating behaviors Gail Crawford mood is stable and she is not sure if she wants to keep taking this and asks if she can discontinue this medicine. She is struggling with emotional eating and using food for comfort to the extent that it is negatively impacting her health. She often snacks when she is not hungry. Gail Crawford sometimes feels she is out of control and then feels guilty that she made poor food choices. She has been working on behavior modification techniques to help reduce her emotional eating and has been somewhat successful.   Depression screen PHQ 2/9 09/05/2018  Decreased Interest 2  Down, Depressed, Hopeless 3  PHQ - 2 Score 5  Altered sleeping 2  Tired, decreased energy 3  Change in appetite 1  Feeling bad or failure about yourself  1  Trouble concentrating 0  Moving slowly or fidgety/restless 2  Suicidal thoughts 0  PHQ-9 Score 14  Difficult doing work/chores Somewhat difficult   ASSESSMENT AND PLAN:  Vitamin D deficiency - Plan: Vitamin D, Ergocalciferol, (DRISDOL) 1.25 MG (50000 UT) CAPS capsule  Essential hypertension - Plan: losartan (COZAAR) 50 MG tablet, hydrochlorothiazide (HYDRODIURIL) 12.5 MG tablet  Type 2 diabetes mellitus without complication, without long-term current use of insulin ([facility]) - Plan: metFORMIN (GLUCOPHAGE) 500 MG tablet  Other depression - with emotional eating  - Plan: buPROPion (WELLBUTRIN SR) 150 MG 12 hr tablet  Class 2 severe obesity with serious comorbidity and body mass index (BMI) of 39.0 to 39.9 in adult, unspecified obesity type ([place])  PLAN:  Vitamin D Deficiency Gail Crawford was informed that low vitamin D levels contribute to fatigue and are associated with obesity, breast, and colon cancer. Gail Crawford agrees to continue to take prescription Vit D @50 ,000 IU every week #4 with no refills and to stop OTC vitamin D. We will follow up for routine testing of vitamin D, at least 2-3 times per  year. She was informed of the risk of over-replacement of vitamin D and agrees to not increase her dose unless she discusses this with [place] first. We will recheck labs in 3 months and Gail Crawford agrees to follow up in 2 to 3 weeks as directed.  Hypertension We discussed sodium restriction, working on healthy weight loss, and a regular exercise program as the means to achieve improved blood pressure control. We will continue to monitor her blood pressure as well as her progress with the above lifestyle modifications. Gail Crawford will continue her HCTZ 12.5 mg qd #30 and losartan 50 mg qd #90 with no refills. She agreed to continue checking her blood pressure at home and will watch for signs of hypotension as she continues her lifestyle modifications. Gail Crawford agreed with this plan and agreed to follow up as directed.  Diabetes II Gail Crawford has been given extensive diabetes education by myself today including ideal fasting and post-prandial blood glucose readings, individual ideal Hgb A1c goals, and hypoglycemia prevention. We discussed the importance of good blood sugar control to decrease the likelihood of diabetic complications such as nephropathy, neuropathy, limb loss, blindness, coronary artery disease, and death. We discussed the importance of intensive lifestyle modification including diet, exercise, and weight loss as the first line treatment for diabetes. Gail Crawford agrees to continue her diet, exercise, and taking metformin 500 mg qAM #90 with no refills and will follow up at the agreed upon time in 2 to 3 weeks.  Depression with Emotional Eating Behaviors We discussed behavior modification techniques today to help Gail Crawford deal with her emotional eating and depression. Gail Crawford is ok to stop taking the Wellbutrin SR 150 mg qhs #30 with no refills, but she will have a refill ready if she changes her mind. She agreed to follow up as directed in 2 to 3 weeks.  I spent > than 50% of the 25 minute visit  on counseling as documented in the note.  Obesity Gail Crawford is currently in the action stage of change. As such, her goal is to continue with weight loss efforts. She has agreed to follow the Category 2 plan. Gail Crawford has been instructed to work up to a goal of 150 minutes of combined cardio and strengthening exercise per week for weight loss and overall health benefits. We discussed the following Behavioral Modification Strategies today: increasing lean protein intake, keeping healthy foods in the home, and decreasing simple carbohydrates.   Gail Crawford has agreed to follow up with our clinic in 2 to 3 weeks. She was informed of the importance of frequent follow up visits to maximize her success with intensive lifestyle modifications for her multiple health conditions.  ALLERGIES: Allergies  Allergen Reactions  . Lisinopril Cough  . Sulfa Antibiotics Nausea And Vomiting    MEDICATIONS: Current Outpatient Medications on File Prior to Visit  Medication Sig Dispense Refill  . amoxicillin-clavulanate (AUGMENTIN) 875-125 MG tablet Take 1 tablet by mouth 2 (two) times daily.    . diclofenac (VOLTAREN) 75 MG EC tablet Take 75 mg by mouth 2 (two) times daily.    . fexofenadine (ALLEGRA) 180 MG tablet Take 180 mg by mouth daily.    Marland Kitchen  fluticasone (FLONASE) 50 MCG/ACT nasal spray Place into both nostrils daily.    Marland Kitchen guaiFENesin (MUCINEX) 600 MG 12 hr tablet Take 600 mg by mouth 2 (two) times daily.     No current facility-administered medications on file prior to visit.     PAST MEDICAL HISTORY: Past Medical History:  Diagnosis Date  . Asthma 01-2004  . Back pain   . 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 HTN (hypertension)   . Joint pain   . Leg edema     PAST SURGICAL HISTORY: Past Surgical History:  Procedure Laterality Date  . ABDOMINAL HYSTERECTOMY  07-15-04   TYPE II RADICAL HYSTERECTOMY WITH BSO  . [place]  . MASS EXCISION Left 06/11/2015    Procedure: EXCISION LEFT ABDOMINAL WALL MASS 6X3CM;  Surgeon: Stark Klein, MD;  Location: [facility];  Service: General;  Laterality: Left;  . ORTHOPEDIC SURGERY     RIGHT SHOULDER AND ELBOW AND RIGHT FOOT    SOCIAL HISTORY: Social History   Tobacco Use  . Smoking status: Never Smoker  . Smokeless tobacco: Never Used  Substance Use Topics  . Alcohol use: No  . Drug use: No    FAMILY HISTORY: Family History  Problem Relation Age of Onset  . Heart disease Mother   . Heart disease Father     ROS: Review of Systems  Cardiovascular: Negative for chest pain.  Gastrointestinal: Negative for nausea and vomiting.  Musculoskeletal:       Negative for muscle weakness.  Neurological: Negative for headaches.  Endo/Heme/Allergies:       Negative for hypoglycemia.  Psychiatric/Behavioral: Positive for depression.    PHYSICAL EXAM: Pt in no acute distress  RECENT LABS AND TESTS: BMET    Component Value Date/Time   NA 141 01/03/2019 0949   K 4.7 01/03/2019 0949   CL 101 01/03/2019 0949   CO2 24 01/03/2019 0949   GLUCOSE 99 01/03/2019 0949   GLUCOSE 92 01/15/2012 0908   BUN 14 01/03/2019 0949   CREATININE 0.80 01/03/2019 0949   CALCIUM 9.4 01/03/2019 0949   GFRNONAA 78 01/03/2019 0949   GFRAA 90 01/03/2019 0949   Lab Results  Component Value Date   HGBA1C 5.8 (H) 01/03/2019   HGBA1C 7.1 (H) 09/05/2018   Lab Results  Component Value Date   INSULIN 13.3 01/03/2019   INSULIN 15.8 09/05/2018   CBC    Component Value Date/Time   WBC 9.0 09/05/2018 0000   WBC 6.5 01/15/2012 0908   RBC 4.78 09/05/2018 0000   RBC 4.65 01/15/2012 0908   HGB 13.5 09/05/2018 0000   HGB 13.6 01/15/2012 0908   HCT 41.6 09/05/2018 0000   HCT 40.9 01/15/2012 0908   PLT 236 01/15/2012 0908   MCV 87 09/05/2018 0000   MCV 87.8 01/15/2012 0908   MCH 28.2 09/05/2018 0000   MCH 29.3 01/15/2012 0908   MCHC 32.5 09/05/2018 0000   MCHC 33.3 01/15/2012 0908   RDW 12.8 09/05/2018  0000   RDW 13.0 01/15/2012 0908   LYMPHSABS 2.6 09/05/2018 0000   LYMPHSABS 1.9 01/15/2012 0908   MONOABS 0.4 01/15/2012 0908   EOSABS 0.1 09/05/2018 0000   BASOSABS 0.1 09/05/2018 0000   BASOSABS 0.0 01/15/2012 0908   Iron/TIBC/Ferritin/ %Sat No results found for: IRON, TIBC, FERRITIN, IRONPCTSAT Lipid Panel     Component Value Date/Time   CHOL 177 01/03/2019 0949   TRIG 185 (H) 01/03/2019 0949   HDL 39 (L) 01/03/2019  0949   CHOLHDL 4.0 01/18/2006 0904   VLDL 28 01/18/2006 0904   LDLCALC 101 (H) 01/03/2019 0949   Hepatic Function Panel     Component Value Date/Time   PROT 7.0 01/03/2019 0949   ALBUMIN 4.4 01/03/2019 0949   AST 16 01/03/2019 0949   ALT 12 01/03/2019 0949   ALKPHOS 127 (H) 01/03/2019 0949   BILITOT 0.3 01/03/2019 0949      Component Value Date/Time   TSH 1.920 09/05/2018 0000    Results for YAEL, COPPESS (MRN [id number]) as of 01/16/2019 10:24  Ref. Range 01/03/2019 09:49  Vitamin D, 25-Hydroxy Latest Ref Range: 30.0 - 100.0 ng/mL 75.1    I, Marcille Blanco, CMA, am acting as transcriptionist for Starlyn Skeans, MD I have reviewed the above documentation for accuracy and completeness, and I agree with the above. -Dennard Nip, MD

## 2019-01-30 ENCOUNTER — Encounter (INDEPENDENT_AMBULATORY_CARE_PROVIDER_SITE_OTHER): Payer: Self-pay | Admitting: Family Medicine

## 2019-01-30 ENCOUNTER — Other Ambulatory Visit: Payer: Self-pay

## 2019-01-30 ENCOUNTER — Ambulatory Visit (INDEPENDENT_AMBULATORY_CARE_PROVIDER_SITE_OTHER): Payer: Managed Care, Other (non HMO) | Admitting: Family Medicine

## 2019-01-30 DIAGNOSIS — I10 Essential (primary) hypertension: Secondary | ICD-10-CM | POA: Diagnosis not present

## 2019-01-30 DIAGNOSIS — Z6839 Body mass index (BMI) 39.0-39.9, adult: Secondary | ICD-10-CM | POA: Diagnosis not present

## 2019-01-30 NOTE — Progress Notes (Signed)
Office: [phone]  /  Fax: [phone] TeleHealth Visit:  Gail Crawford has verbally consented to this TeleHealth visit today. The patient is located at work, the provider is located at the [employer] [facility] office. The participants in this visit include the listed provider and patient and any and all parties involved. The visit was conducted today via telephone. Gail Crawford was unable to use realtime audiovisual technology today (no FaceTime) and the telehealth visit was conducted via telephone.  HPI:   Chief Complaint: OBESITY Gail Crawford is here to discuss her progress with her obesity treatment plan. She is on the Category 2 plan and is following her eating plan approximately 75 % of the time. She states she is walking 15 minutes 3 times per week. Gail Crawford has done well maintaining her weight during the last two weeks. She has deviated from her plan more, but she has not gained weight. Her last day at work, for the Spring semester, is this Friday and, she will get back on track after that. We were unable to weigh the patient today for this TeleHealth visit. She feels as if she has maintained weight since her last visit. She has lost 26 lbs since starting treatment with [place].  Hypertension Gail Crawford is a 63 y.o. female with hypertension. Her blood pressure is elevated this morning per her home reading (169/76). She has not been taking the HCTZ, as she forgot. Gail Crawford denies chest pain or headache. She is working weight loss to help control her blood pressure with the goal of decreasing her risk of heart attack and stroke. Catherines blood pressure is not currently controlled.  ASSESSMENT AND PLAN:  Essential hypertension  Class 2 severe obesity with serious comorbidity and body mass index (BMI) of 39.0 to 39.9 in adult, unspecified obesity type ([place])  PLAN:  Hypertension We discussed sodium restriction, working on healthy weight loss, and a regular  exercise program as the means to achieve improved blood pressure control. Gail Crawford agreed with this plan. We will continue to monitor her blood pressure as well as her progress with the above lifestyle modifications. She will restart HCTZ and continue Losartan. She will continue to check her blood pressure every day and she will follow up in 2 weeks. Gail Crawford will watch for signs of hypotension as she continues her lifestyle modifications.  I spent > than 50% of the 15 minute visit on counseling as documented in the note.  Obesity Gail Crawford is currently in the action stage of change. As such, her goal is to continue with weight loss efforts She has agreed to follow the Category 2 plan Gail Crawford has been instructed to work up to a goal of 150 minutes of combined cardio and strengthening exercise per week for weight loss and overall health benefits. We discussed the following Behavioral Modification Strategies today: increase H2O intake,  decreasing sodium intake, work on meal planning and easy cooking plans and emotional eating strategies  Gail Crawford has agreed to follow up with our clinic in 2 weeks. She was informed of the importance of frequent follow up visits to maximize her success with intensive lifestyle modifications for her multiple health conditions.  ALLERGIES: Allergies  Allergen Reactions  . Lisinopril Cough  . Sulfa Antibiotics Nausea And Vomiting    MEDICATIONS: Current Outpatient Medications on File Prior to Visit  Medication Sig Dispense Refill  . buPROPion (WELLBUTRIN SR) 150 MG 12 hr tablet Take 1 tablet (150 mg total) by mouth at bedtime. 30 tablet 0  .  diclofenac (VOLTAREN) 75 MG EC tablet Take 75 mg by mouth 2 (two) times daily.    . fexofenadine (ALLEGRA) 180 MG tablet Take 180 mg by mouth daily.    . fluticasone (FLONASE) 50 MCG/ACT nasal spray Place into both nostrils daily.    Marland Kitchen guaiFENesin (MUCINEX) 600 MG 12 hr tablet Take 600 mg by mouth 2 (two) times daily.     . hydrochlorothiazide (HYDRODIURIL) 12.5 MG tablet Take 1 tablet (12.5 mg total) by mouth daily. 30 tablet 0  . losartan (COZAAR) 50 MG tablet Take 1 tablet (50 mg total) by mouth daily. 90 tablet 0  . metFORMIN (GLUCOPHAGE) 500 MG tablet Take 1 tablet (500 mg total) by mouth daily with breakfast. 90 tablet 0  . Vitamin D, Ergocalciferol, (DRISDOL) 1.25 MG (50000 UT) CAPS capsule Take 1 capsule (50,000 Units total) by mouth every 7 (seven) days. 12 capsule 0   No current facility-administered medications on file prior to visit.     PAST MEDICAL HISTORY: Past Medical History:  Diagnosis Date  . Asthma 01-2004  . Back pain   . 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 HTN (hypertension)   . Joint pain   . Leg edema     PAST SURGICAL HISTORY: Past Surgical History:  Procedure Laterality Date  . ABDOMINAL HYSTERECTOMY  07-15-04   TYPE II RADICAL HYSTERECTOMY WITH BSO  . [place]  . MASS EXCISION Left 06/11/2015   Procedure: EXCISION LEFT ABDOMINAL WALL MASS 6X3CM;  Surgeon: Stark Klein, MD;  Location: [facility];  Service: General;  Laterality: Left;  . ORTHOPEDIC SURGERY     RIGHT SHOULDER AND ELBOW AND RIGHT FOOT    SOCIAL HISTORY: Social History   Tobacco Use  . Smoking status: Never Smoker  . Smokeless tobacco: Never Used  Substance Use Topics  . Alcohol use: No  . Drug use: No    FAMILY HISTORY: Family History  Problem Relation Age of Onset  . Heart disease Mother   . Heart disease Father     ROS: Review of Systems  Constitutional: Negative for weight loss.  Cardiovascular: Negative for chest pain.  Neurological: Negative for headaches.    PHYSICAL EXAM: Pt in no acute distress  RECENT LABS AND TESTS: BMET    Component Value Date/Time   NA 141 01/03/2019 0949   K 4.7 01/03/2019 0949   CL 101 01/03/2019 0949   CO2 24 01/03/2019 0949   GLUCOSE 99 01/03/2019 0949   GLUCOSE 92 01/15/2012 0908   BUN 14  01/03/2019 0949   CREATININE 0.80 01/03/2019 0949   CALCIUM 9.4 01/03/2019 0949   GFRNONAA 78 01/03/2019 0949   GFRAA 90 01/03/2019 0949   Lab Results  Component Value Date   HGBA1C 5.8 (H) 01/03/2019   HGBA1C 7.1 (H) 09/05/2018   Lab Results  Component Value Date   INSULIN 13.3 01/03/2019   INSULIN 15.8 09/05/2018   CBC    Component Value Date/Time   WBC 9.0 09/05/2018 0000   WBC 6.5 01/15/2012 0908   RBC 4.78 09/05/2018 0000   RBC 4.65 01/15/2012 0908   HGB 13.5 09/05/2018 0000   HGB 13.6 01/15/2012 0908   HCT 41.6 09/05/2018 0000   HCT 40.9 01/15/2012 0908   PLT 236 01/15/2012 0908   MCV 87 09/05/2018 0000   MCV 87.8 01/15/2012 0908   MCH 28.2 09/05/2018 0000   MCH 29.3 01/15/2012 0908   MCHC 32.5 09/05/2018 0000   MCHC  33.3 01/15/2012 0908   RDW 12.8 09/05/2018 0000   RDW 13.0 01/15/2012 0908   LYMPHSABS 2.6 09/05/2018 0000   LYMPHSABS 1.9 01/15/2012 0908   MONOABS 0.4 01/15/2012 0908   EOSABS 0.1 09/05/2018 0000   BASOSABS 0.1 09/05/2018 0000   BASOSABS 0.0 01/15/2012 0908   Iron/TIBC/Ferritin/ %Sat No results found for: IRON, TIBC, FERRITIN, IRONPCTSAT Lipid Panel     Component Value Date/Time   CHOL 177 01/03/2019 0949   TRIG 185 (H) 01/03/2019 0949   HDL 39 (L) 01/03/2019 0949   CHOLHDL 4.0 01/18/2006 0904   VLDL 28 01/18/2006 0904   LDLCALC 101 (H) 01/03/2019 0949   Hepatic Function Panel     Component Value Date/Time   PROT 7.0 01/03/2019 0949   ALBUMIN 4.4 01/03/2019 0949   AST 16 01/03/2019 0949   ALT 12 01/03/2019 0949   ALKPHOS 127 (H) 01/03/2019 0949   BILITOT 0.3 01/03/2019 0949      Component Value Date/Time   TSH 1.920 09/05/2018 0000    Results for MCKAYLA, MULCAHEY (MRN [id number]) as of 01/30/2019 12:16  Ref. Range 01/03/2019 09:49  Vitamin D, 25-Hydroxy Latest Ref Range: 30.0 - 100.0 ng/mL 75.1    I, Doreene Nest, am acting as Location manager for Dennard Nip, MD I have reviewed the above documentation for accuracy and  completeness, and I agree with the above. -Dennard Nip, MD

## 2019-02-13 ENCOUNTER — Other Ambulatory Visit: Payer: Self-pay

## 2019-02-13 ENCOUNTER — Ambulatory Visit (INDEPENDENT_AMBULATORY_CARE_PROVIDER_SITE_OTHER): Payer: Managed Care, Other (non HMO) | Admitting: Family Medicine

## 2019-02-13 ENCOUNTER — Encounter (INDEPENDENT_AMBULATORY_CARE_PROVIDER_SITE_OTHER): Payer: Self-pay | Admitting: Family Medicine

## 2019-02-13 DIAGNOSIS — Z6839 Body mass index (BMI) 39.0-39.9, adult: Secondary | ICD-10-CM | POA: Diagnosis not present

## 2019-02-13 DIAGNOSIS — I10 Essential (primary) hypertension: Secondary | ICD-10-CM

## 2019-02-13 MED ORDER — CHLORTHALIDONE 25 MG PO TABS: 25.0000 mg | ORAL_TABLET | Freq: Every day | ORAL | 0 refills | Status: DC

## 2019-02-13 NOTE — Progress Notes (Signed)
Office: [phone]  /  Fax: [phone] TeleHealth Visit:  Gail Crawford has verbally consented to this TeleHealth visit today. The patient is located in the car, the provider is located at the [employer] [facility] office. The participants in this visit include the listed provider and patient and [name]. Bitha was unable to use realtime audiovisual technology today and the telehealth visit was conducted via telephone.   HPI:   Chief Complaint: OBESITY Gail Crawford is here to discuss her progress with her obesity treatment plan. She is on the  follow the Category 2 plan and is following her eating plan approximately 45 % of the time. She states she is exercising 0 minutes 0 times per week. Gail Crawford has been off track with her eating plan and feels she has gained about 4 lbs over the last 2 weeks. She is going out of town today for 3 days but would like to get back on track with her eating plan when she gets back. She is especially struggling with lunch options.  We were unable to weigh the patient today for this TeleHealth visit. She feels as if she has gained weight since her last visit. She has lost 26 lbs since starting treatment with [place].  Hypertension MEGON KALINA is a 63 y.o. female with hypertension.  Gail Crawford denies chest pain, headache, or shortness of breath on exertion. She is working weight loss to help control her blood pressure with the goal of decreasing her risk of heart attack and stroke. Gail Crawford blood pressure continues to be elevated. She gained a bit of weight. HCTZ did not seem to help much. Her recent electrolytes are within normal limits.    ASSESSMENT AND PLAN:  Essential hypertension - Plan: chlorthalidone (HYGROTON) 25 MG tablet  Class 2 severe obesity with serious comorbidity and body mass index (BMI) of 39.0 to 39.9 in adult, unspecified obesity type ([place])  PLAN: Hypertension We discussed sodium restriction, working on healthy  weight loss, and a regular exercise program as the means to achieve improved blood pressure control. [name] agreed with this plan and agreed to follow up as directed. We will continue to monitor her blood pressure as well as her progress with the above lifestyle modifications. She will continue her medications as prescribed and discontinue HCTZ and start chlorthalidone 25 mg qd #30 with no refills and will watch for signs of hypotension as she continues her lifestyle modifications.  I spent > than 50% of the 25 minute visit on counseling as documented in the note.  Obesity [name] is currently in the action stage of change. As such, her goal is to continue with weight loss efforts She has agreed to keep a food journal with 300-500 calories and 30+g of protein at lunch and follow the Category 2 plan Vicie has been instructed to work up to a goal of 150 minutes of combined cardio and strengthening exercise per week for weight loss and overall health benefits. We discussed the following Behavioral Modification Stratagies today:keeping a strict food journal,  increasing lean protein intake, decreasing simple carbohydrates  and work on meal planning and easy cooking plans   Gail Crawford has agreed to follow up with our clinic in 2 weeks. She was informed of the importance of frequent follow up visits to maximize her success with intensive lifestyle modifications for her multiple health conditions.  ALLERGIES: Allergies  Allergen Reactions  . Lisinopril Cough  . Sulfa Antibiotics Nausea And Vomiting    MEDICATIONS: Current Outpatient  Medications on File Prior to Visit  Medication Sig Dispense Refill  . diclofenac (VOLTAREN) 75 MG EC tablet Take 75 mg by mouth 2 (two) times daily.    . fexofenadine (ALLEGRA) 180 MG tablet Take 180 mg by mouth daily.    . fluticasone (FLONASE) 50 MCG/ACT nasal spray Place into both nostrils daily.    Marland Kitchen guaiFENesin (MUCINEX) 600 MG 12 hr tablet Take 600 mg by  mouth 2 (two) times daily.    Marland Kitchen losartan (COZAAR) 50 MG tablet Take 1 tablet (50 mg total) by mouth daily. 90 tablet 0  . metFORMIN (GLUCOPHAGE) 500 MG tablet Take 1 tablet (500 mg total) by mouth daily with breakfast. 90 tablet 0  . Vitamin D, Ergocalciferol, (DRISDOL) 1.25 MG (50000 UT) CAPS capsule Take 1 capsule (50,000 Units total) by mouth every 7 (seven) days. 12 capsule 0   No current facility-administered medications on file prior to visit.     PAST MEDICAL HISTORY: Past Medical History:  Diagnosis Date  . Asthma 01-2004  . Back pain   . 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 HTN (hypertension)   . Joint pain   . Leg edema     PAST SURGICAL HISTORY: Past Surgical History:  Procedure Laterality Date  . ABDOMINAL HYSTERECTOMY  07-15-04   TYPE II RADICAL HYSTERECTOMY WITH BSO  . [place]  . MASS EXCISION Left 06/11/2015   Procedure: EXCISION LEFT ABDOMINAL WALL MASS 6X3CM;  Surgeon: Stark Klein, MD;  Location: [facility];  Service: General;  Laterality: Left;  . ORTHOPEDIC SURGERY     RIGHT SHOULDER AND ELBOW AND RIGHT FOOT    SOCIAL HISTORY: Social History   Tobacco Use  . Smoking status: Never Smoker  . Smokeless tobacco: Never Used  Substance Use Topics  . Alcohol use: No  . Drug use: No    FAMILY HISTORY: Family History  Problem Relation Age of Onset  . Heart disease Mother   . Heart disease Father     ROS: Review of Systems  Respiratory: Negative for shortness of breath.   Cardiovascular: Negative for chest pain.  Neurological: Negative for headaches.    PHYSICAL EXAM: Pt in no acute distress  RECENT LABS AND TESTS: BMET    Component Value Date/Time   NA 141 01/03/2019 0949   K 4.7 01/03/2019 0949   CL 101 01/03/2019 0949   CO2 24 01/03/2019 0949   GLUCOSE 99 01/03/2019 0949   GLUCOSE 92 01/15/2012 0908   BUN 14 01/03/2019 0949   CREATININE 0.80 01/03/2019 0949   CALCIUM 9.4 01/03/2019 0949    GFRNONAA 78 01/03/2019 0949   GFRAA 90 01/03/2019 0949   Lab Results  Component Value Date   HGBA1C 5.8 (H) 01/03/2019   HGBA1C 7.1 (H) 09/05/2018   Lab Results  Component Value Date   INSULIN 13.3 01/03/2019   INSULIN 15.8 09/05/2018   CBC    Component Value Date/Time   WBC 9.0 09/05/2018 0000   WBC 6.5 01/15/2012 0908   RBC 4.78 09/05/2018 0000   RBC 4.65 01/15/2012 0908   HGB 13.5 09/05/2018 0000   HGB 13.6 01/15/2012 0908   HCT 41.6 09/05/2018 0000   HCT 40.9 01/15/2012 0908   PLT 236 01/15/2012 0908   MCV 87 09/05/2018 0000   MCV 87.8 01/15/2012 0908   MCH 28.2 09/05/2018 0000   MCH 29.3 01/15/2012 0908   MCHC 32.5 09/05/2018 0000   MCHC 33.3 01/15/2012 0908  RDW 12.8 09/05/2018 0000   RDW 13.0 01/15/2012 0908   LYMPHSABS 2.6 09/05/2018 0000   LYMPHSABS 1.9 01/15/2012 0908   MONOABS 0.4 01/15/2012 0908   EOSABS 0.1 09/05/2018 0000   BASOSABS 0.1 09/05/2018 0000   BASOSABS 0.0 01/15/2012 0908   Iron/TIBC/Ferritin/ %Sat No results found for: IRON, TIBC, FERRITIN, IRONPCTSAT Lipid Panel     Component Value Date/Time   CHOL 177 01/03/2019 0949   TRIG 185 (H) 01/03/2019 0949   HDL 39 (L) 01/03/2019 0949   CHOLHDL 4.0 01/18/2006 0904   VLDL 28 01/18/2006 0904   LDLCALC 101 (H) 01/03/2019 0949   Hepatic Function Panel     Component Value Date/Time   PROT 7.0 01/03/2019 0949   ALBUMIN 4.4 01/03/2019 0949   AST 16 01/03/2019 0949   ALT 12 01/03/2019 0949   ALKPHOS 127 (H) 01/03/2019 0949   BILITOT 0.3 01/03/2019 0949      Component Value Date/Time   TSH 1.920 09/05/2018 0000      I, Renee Ramus, am acting as Location manager for Dennard Nip, MD  I have reviewed the above documentation for accuracy and completeness, and I agree with the above. -Dennard Nip, MD

## 2019-03-01 ENCOUNTER — Encounter (INDEPENDENT_AMBULATORY_CARE_PROVIDER_SITE_OTHER): Payer: Self-pay | Admitting: Family Medicine

## 2019-03-01 ENCOUNTER — Telehealth (INDEPENDENT_AMBULATORY_CARE_PROVIDER_SITE_OTHER): Payer: Managed Care, Other (non HMO) | Admitting: Family Medicine

## 2019-03-01 ENCOUNTER — Other Ambulatory Visit: Payer: Self-pay

## 2019-03-01 DIAGNOSIS — I10 Essential (primary) hypertension: Secondary | ICD-10-CM | POA: Diagnosis not present

## 2019-03-01 DIAGNOSIS — Z6839 Body mass index (BMI) 39.0-39.9, adult: Secondary | ICD-10-CM

## 2019-03-02 NOTE — Progress Notes (Signed)
Office: [phone]  /  Fax: [phone] TeleHealth Visit:  Gail Crawford has verbally consented to this TeleHealth visit today. The patient is located at home, the provider is located at the [employer] [facility] office. The participants in this visit include the listed provider and patient. The visit was conducted today via telephone call (Doxy.me failed - changed to telephone call).  HPI:   Chief Complaint: OBESITY Gail Crawford is here to discuss her progress with her obesity treatment plan. She is on the Category 2 plan and is following her eating plan approximately 80% of the time. She states she is exercising 0 minutes 0 times per week. Gail Crawford continues to do well with weight loss and thinks she has lost another 4 lbs. She feels she is doing better on her eating plan, but also started chlorthalidone and some of her weight loss is likely due to decreased water weight.  We were unable to weigh the patient today for this TeleHealth visit. She feels as if she has lost 4 lbs since her last visit. She has lost 26 lbs since starting treatment with [place].  Hypertension Gail Crawford is a 63 y.o. female with hypertension and she states her blood pressure has been elevated in the past. Gail Crawford denies chest pain or shortness of breath on exertion. She is working weight loss to help control her blood pressure with the goal of decreasing her risk of heart attack and stroke. Gail Crawford reports her blood pressures at home are running 129-131/71-74, controlled on losartan and chlorthalidone which she started at her last visit.  ASSESSMENT AND PLAN:  Essential hypertension  Class 2 severe obesity with serious comorbidity and body mass index (BMI) of 39.0 to 39.9 in adult, unspecified obesity type ([place])  PLAN:  Hypertension We discussed sodium restriction, working on healthy weight loss, and a regular exercise program as the means to achieve improved blood pressure control.  Gail Crawford agreed with this plan and agreed to follow up as directed. We will continue to monitor her blood pressure as well as her progress with the above lifestyle modifications. She will continue her medications as prescribed and will watch for signs of hypotension as she continues her lifestyle modifications. She will get back to her diet prescription.  I spent > than 50% of the 15 minute visit on counseling as documented in the note.  Obesity Gail Crawford is currently in the action stage of change. As such, her goal is to continue with weight loss efforts. She has agreed to follow the Category 2 plan. Gail Crawford has been instructed to work up to a goal of 150 minutes of combined cardio and strengthening exercise per week for weight loss and overall health benefits. We discussed the following Behavioral Modification Strategies today: increasing lean protein intake, increasing vegetables, decreasing sodium intake, and travel eating strategies.  Kenia has agreed to follow-up with our clinic in 2 weeks. She was informed of the importance of frequent follow-up visits to maximize her success with intensive lifestyle modifications for her multiple health conditions.  ALLERGIES: Allergies  Allergen Reactions   Lisinopril Cough   Sulfa Antibiotics Nausea And Vomiting    MEDICATIONS: Current Outpatient Medications on File Prior to Visit  Medication Sig Dispense Refill   chlorthalidone (HYGROTON) 25 MG tablet Take 1 tablet (25 mg total) by mouth daily. 30 tablet 0   diclofenac (VOLTAREN) 75 MG EC tablet Take 75 mg by mouth 2 (two) times daily.     fexofenadine (ALLEGRA) 180 MG  tablet Take 180 mg by mouth daily.     fluticasone (FLONASE) 50 MCG/ACT nasal spray Place into both nostrils daily.     guaiFENesin (MUCINEX) 600 MG 12 hr tablet Take 600 mg by mouth 2 (two) times daily.     losartan (COZAAR) 50 MG tablet Take 1 tablet (50 mg total) by mouth daily. 90 tablet 0   metFORMIN  (GLUCOPHAGE) 500 MG tablet Take 1 tablet (500 mg total) by mouth daily with breakfast. 90 tablet 0   Vitamin D, Ergocalciferol, (DRISDOL) 1.25 MG (50000 UT) CAPS capsule Take 1 capsule (50,000 Units total) by mouth every 7 (seven) days. 12 capsule 0   No current facility-administered medications on file prior to visit.     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   HTN (hypertension)    Joint pain    Leg edema     PAST SURGICAL HISTORY: Past Surgical History:  Procedure Laterality Date   ABDOMINAL HYSTERECTOMY  07-15-04   TYPE II RADICAL HYSTERECTOMY WITH BSO   CESAREAN SECTION  1978   MASS EXCISION Left 06/11/2015   Procedure: EXCISION LEFT ABDOMINAL WALL MASS 6X3CM;  Surgeon: Stark Klein, MD;  Location: [facility];  Service: General;  Laterality: Left;   ORTHOPEDIC SURGERY     RIGHT SHOULDER AND ELBOW AND RIGHT FOOT    SOCIAL HISTORY: Social History   Tobacco Use   Smoking status: Never Smoker   Smokeless tobacco: Never Used  Substance Use Topics   Alcohol use: No   Drug use: No    FAMILY HISTORY: Family History  Problem Relation Age of Onset   Heart disease Mother    Heart disease Father    ROS: Review of Systems  Respiratory: Negative for shortness of breath.   Cardiovascular: Negative for chest pain.   PHYSICAL EXAM: Pt in no acute distress  RECENT LABS AND TESTS: BMET    Component Value Date/Time   NA 141 01/03/2019 0949   K 4.7 01/03/2019 0949   CL 101 01/03/2019 0949   CO2 24 01/03/2019 0949   GLUCOSE 99 01/03/2019 0949   GLUCOSE 92 01/15/2012 0908   BUN 14 01/03/2019 0949   CREATININE 0.80 01/03/2019 0949   CALCIUM 9.4 01/03/2019 0949   GFRNONAA 78 01/03/2019 0949   GFRAA 90 01/03/2019 0949   Lab Results  Component Value Date   HGBA1C 5.8 (H) 01/03/2019   HGBA1C 7.1 (H) 09/05/2018   Lab Results    Component Value Date   INSULIN 13.3 01/03/2019   INSULIN 15.8 09/05/2018   CBC    Component Value Date/Time   WBC 9.0 09/05/2018 0000   WBC 6.5 01/15/2012 0908   RBC 4.78 09/05/2018 0000   RBC 4.65 01/15/2012 0908   HGB 13.5 09/05/2018 0000   HGB 13.6 01/15/2012 0908   HCT 41.6 09/05/2018 0000   HCT 40.9 01/15/2012 0908   PLT 236 01/15/2012 0908   MCV 87 09/05/2018 0000   MCV 87.8 01/15/2012 0908   MCH 28.2 09/05/2018 0000   MCH 29.3 01/15/2012 0908   MCHC 32.5 09/05/2018 0000   MCHC 33.3 01/15/2012 0908   RDW 12.8 09/05/2018 0000   RDW 13.0 01/15/2012 0908   LYMPHSABS 2.6 09/05/2018 0000   LYMPHSABS 1.9 01/15/2012 0908   MONOABS 0.4 01/15/2012 0908   EOSABS 0.1 09/05/2018 0000   BASOSABS 0.1 09/05/2018 0000  BASOSABS 0.0 01/15/2012 0908   Iron/TIBC/Ferritin/ %Sat No results found for: IRON, TIBC, FERRITIN, IRONPCTSAT Lipid Panel     Component Value Date/Time   CHOL 177 01/03/2019 0949   TRIG 185 (H) 01/03/2019 0949   HDL 39 (L) 01/03/2019 0949   CHOLHDL 4.0 01/18/2006 0904   VLDL 28 01/18/2006 0904   LDLCALC 101 (H) 01/03/2019 0949   Hepatic Function Panel     Component Value Date/Time   PROT 7.0 01/03/2019 0949   ALBUMIN 4.4 01/03/2019 0949   AST 16 01/03/2019 0949   ALT 12 01/03/2019 0949   ALKPHOS 127 (H) 01/03/2019 0949   BILITOT 0.3 01/03/2019 0949      Component Value Date/Time   TSH 1.920 09/05/2018 0000   Results for RAMANDA, PAULES (MRN [id number]) as of 03/02/2019 11:43  Ref. Range 01/03/2019 09:49  Vitamin D, 25-Hydroxy Latest Ref Range: 30.0 - 100.0 ng/mL 75.1   I, Michaelene Song, am acting as Location manager for Dennard Nip, MD I have reviewed the above documentation for accuracy and completeness, and I agree with the above. -Dennard Nip, MD

## 2019-03-16 ENCOUNTER — Telehealth (INDEPENDENT_AMBULATORY_CARE_PROVIDER_SITE_OTHER): Payer: Managed Care, Other (non HMO) | Admitting: Family Medicine

## 2019-03-16 ENCOUNTER — Encounter (INDEPENDENT_AMBULATORY_CARE_PROVIDER_SITE_OTHER): Payer: Self-pay | Admitting: Family Medicine

## 2019-03-16 ENCOUNTER — Other Ambulatory Visit: Payer: Self-pay

## 2019-03-16 DIAGNOSIS — Z6839 Body mass index (BMI) 39.0-39.9, adult: Secondary | ICD-10-CM

## 2019-03-16 DIAGNOSIS — I10 Essential (primary) hypertension: Secondary | ICD-10-CM | POA: Diagnosis not present

## 2019-03-16 MED ORDER — CHLORTHALIDONE 25 MG PO TABS: 25.0000 mg | ORAL_TABLET | Freq: Every day | ORAL | 0 refills | Status: DC

## 2019-03-20 NOTE — Progress Notes (Signed)
Office: [phone]  /  Fax: [phone] TeleHealth Visit:  Gail Crawford has verbally consented to this TeleHealth visit today. The patient is located in [place] (on vacation), the provider is located at the [facility] office. The participants in this visit include the listed provider and patient. The visit was conducted today via telephone call (FaceTime failed - changed to telephone call).  HPI:   Chief Complaint: OBESITY Gail Crawford is here to discuss her progress with her obesity treatment plan. She is on the Category 2 plan and is following her eating plan approximately 50% of the time. She states she is exercising 0 minutes 0 times per week. Gail Crawford has been on vacation and has been trying to [place]. She has not been eating as much lean protein but has tried to increase her vegetables and water intake.  We were unable to weigh the patient today for this TeleHealth visit. She feels as if she has maintained her weight since her last visit. She has lost 26 lbs since starting treatment with [place].  Hypertension Gail Crawford is a 63 y.o. female with hypertension and is stable on chlorthalidone.  Gail Crawford denies chest pain, headache, or dizziness. She is working weight loss to help control her blood pressure with the goal of decreasing her risk of heart attack and stroke.  ASSESSMENT AND PLAN:  Essential hypertension - Plan: chlorthalidone (HYGROTON) 25 MG tablet  Class 2 severe obesity with serious comorbidity and body mass index (BMI) of 39.0 to 39.9 in adult, unspecified obesity type ([place])  PLAN:  Hypertension We discussed sodium restriction, working on healthy weight loss, and a regular exercise program as the means to achieve improved blood pressure control. Zaley agreed with this plan and agreed to follow up as directed. We will continue to monitor her blood pressure as well as her progress with the above lifestyle modifications. [name]  was given a refill on her chlorthalidone 25 mg #30 with 0 refills and agrees to follow-up with our clinic in 2 weeks.  She will watch for signs of hypotension as she continues her lifestyle modifications.  Obesity Gail Crawford is currently in the action stage of change. As such, her goal is to continue with weight loss efforts. She has agreed to follow the Category 2 plan. Gail Crawford has been instructed to work up to a goal of 150 minutes of combined cardio and strengthening exercise per week for weight loss and overall health benefits. We discussed the following Behavioral Modification Strategies today: increasing lean protein intake, better snacking choices, travel eating strategies, holiday eating strategies, and celebration eating strategies.  Gail Crawford has agreed to follow-up with our clinic in 2 weeks. She was informed of the importance of frequent follow-up visits to maximize her success with intensive lifestyle modifications for her multiple health conditions.  ALLERGIES: Allergies  Allergen Reactions  . Lisinopril Cough  . Sulfa Antibiotics Nausea And Vomiting    MEDICATIONS: Current Outpatient Medications on File Prior to Visit  Medication Sig Dispense Refill  . diclofenac (VOLTAREN) 75 MG EC tablet Take 75 mg by mouth 2 (two) times daily.    . fexofenadine (ALLEGRA) 180 MG tablet Take 180 mg by mouth daily.    Marland Kitchen losartan (COZAAR) 50 MG tablet Take 1 tablet (50 mg total) by mouth daily. 90 tablet 0  . metFORMIN (GLUCOPHAGE) 500 MG tablet Take 1 tablet (500 mg total) by mouth daily with breakfast. 90 tablet 0  . Vitamin D, Ergocalciferol, (DRISDOL) 1.25 MG (50000  UT) CAPS capsule Take 1 capsule (50,000 Units total) by mouth every 7 (seven) days. 12 capsule 0   No current facility-administered medications on file prior to visit.     PAST MEDICAL HISTORY: Past Medical History:  Diagnosis Date  . Asthma 01-2004  . Back pain   . 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 HTN (hypertension)   . Joint pain   . Leg edema     PAST SURGICAL HISTORY: Past Surgical History:  Procedure Laterality Date  . ABDOMINAL HYSTERECTOMY  07-15-04   TYPE II RADICAL HYSTERECTOMY WITH BSO  . [place]  . MASS EXCISION Left 06/11/2015   Procedure: EXCISION LEFT ABDOMINAL WALL MASS 6X3CM;  Surgeon: Stark Klein, MD;  Location: [facility];  Service: General;  Laterality: Left;  . ORTHOPEDIC SURGERY     RIGHT SHOULDER AND ELBOW AND RIGHT FOOT    SOCIAL HISTORY: Social History   Tobacco Use  . Smoking status: Never Smoker  . Smokeless tobacco: Never Used  Substance Use Topics  . Alcohol use: No  . Drug use: No    FAMILY HISTORY: Family History  Problem Relation Age of Onset  . Heart disease Mother   . Heart disease Father    ROS: Review of Systems  Cardiovascular: Negative for chest pain.  Neurological: Negative for dizziness and headaches.   PHYSICAL EXAM: Pt in no acute distress  RECENT LABS AND TESTS: BMET    Component Value Date/Time   NA 141 01/03/2019 0949   K 4.7 01/03/2019 0949   CL 101 01/03/2019 0949   CO2 24 01/03/2019 0949   GLUCOSE 99 01/03/2019 0949   GLUCOSE 92 01/15/2012 0908   BUN 14 01/03/2019 0949   CREATININE 0.80 01/03/2019 0949   CALCIUM 9.4 01/03/2019 0949   GFRNONAA 78 01/03/2019 0949   GFRAA 90 01/03/2019 0949   Lab Results  Component Value Date   HGBA1C 5.8 (H) 01/03/2019   HGBA1C 7.1 (H) 09/05/2018   Lab Results  Component Value Date   INSULIN 13.3 01/03/2019   INSULIN 15.8 09/05/2018   CBC    Component Value Date/Time   WBC 9.0 09/05/2018 0000   WBC 6.5 01/15/2012 0908   RBC 4.78 09/05/2018 0000   RBC 4.65 01/15/2012 0908   HGB 13.5 09/05/2018 0000   HGB 13.6 01/15/2012 0908   HCT 41.6 09/05/2018 0000   HCT 40.9 01/15/2012 0908   PLT 236 01/15/2012 0908   MCV 87 09/05/2018 0000   MCV 87.8 01/15/2012 0908   MCH 28.2 09/05/2018 0000   MCH 29.3 01/15/2012 0908   MCHC  32.5 09/05/2018 0000   MCHC 33.3 01/15/2012 0908   RDW 12.8 09/05/2018 0000   RDW 13.0 01/15/2012 0908   LYMPHSABS 2.6 09/05/2018 0000   LYMPHSABS 1.9 01/15/2012 0908   MONOABS 0.4 01/15/2012 0908   EOSABS 0.1 09/05/2018 0000   BASOSABS 0.1 09/05/2018 0000   BASOSABS 0.0 01/15/2012 0908   Iron/TIBC/Ferritin/ %Sat No results found for: IRON, TIBC, FERRITIN, IRONPCTSAT Lipid Panel     Component Value Date/Time   CHOL 177 01/03/2019 0949   TRIG 185 (H) 01/03/2019 0949   HDL 39 (L) 01/03/2019 0949   CHOLHDL 4.0 01/18/2006 0904   VLDL 28 01/18/2006 0904   LDLCALC 101 (H) 01/03/2019 0949   Hepatic Function Panel     Component Value Date/Time   PROT 7.0 01/03/2019 0949   ALBUMIN 4.4 01/03/2019 0949   AST 16 01/03/2019 0949  ALT 12 01/03/2019 0949   ALKPHOS 127 (H) 01/03/2019 0949   BILITOT 0.3 01/03/2019 0949      Component Value Date/Time   TSH 1.920 09/05/2018 0000   Results for YANNET, RINCON (MRN [id number]) as of 03/20/2019 10:23  Ref. Range 01/03/2019 09:49  Vitamin D, 25-Hydroxy Latest Ref Range: 30.0 - 100.0 ng/mL 75.1   I, Michaelene Song, am acting as Location manager for Dennard Nip, MD I have reviewed the above documentation for accuracy and completeness, and I agree with the above. -Dennard Nip, MD

## 2019-03-30 ENCOUNTER — Encounter (INDEPENDENT_AMBULATORY_CARE_PROVIDER_SITE_OTHER): Payer: Self-pay | Admitting: Family Medicine

## 2019-03-30 ENCOUNTER — Telehealth (INDEPENDENT_AMBULATORY_CARE_PROVIDER_SITE_OTHER): Payer: Managed Care, Other (non HMO) | Admitting: Family Medicine

## 2019-03-30 ENCOUNTER — Encounter (INDEPENDENT_AMBULATORY_CARE_PROVIDER_SITE_OTHER): Payer: Self-pay

## 2019-03-30 ENCOUNTER — Other Ambulatory Visit: Payer: Self-pay

## 2019-03-30 DIAGNOSIS — E559 Vitamin D deficiency, unspecified: Secondary | ICD-10-CM | POA: Diagnosis not present

## 2019-03-30 DIAGNOSIS — Z6841 Body Mass Index (BMI) 40.0 and over, adult: Secondary | ICD-10-CM | POA: Diagnosis not present

## 2019-04-03 NOTE — Progress Notes (Signed)
Office: [phone]  /  Fax: [phone] TeleHealth Visit:  Gail Crawford has verbally consented to this TeleHealth visit today. The patient is located in the car, the provider is located at the [facility] office. The participants in this visit include the listed provider and patient. The visit was conducted today via telephone call (Doxy failed - changed to telephone call).  HPI:   Chief Complaint: OBESITY Gail Crawford is here to discuss her progress with her obesity treatment plan. She is on the Category 2 plan and is following her eating plan approximately 70% of the time. She states she has been more active. Adreonna states she gained 3 lbs while on vacation and is ready to get back on track. She states her hunger is mostly controlled but she is getting bored with her plan and would like to look at other options. We were unable to weigh the patient today for this TeleHealth visit. She feels as if she has gained 3 lbs since her last visit. She has lost 26 lbs since starting treatment with [place].  Vitamin D deficiency Gail Crawford has a diagnosis of Vitamin D deficiency. Her last level was at goal and she is currently taking prescription Vit D. She is at risk of over replacement. She denies nausea, vomiting or muscle weakness.  ASSESSMENT AND PLAN:  Vitamin D deficiency  Class 3 severe obesity with serious comorbidity and body mass index (BMI) of 40.0 to 44.9 in adult, unspecified obesity type ([place])  PLAN:  Vitamin D Deficiency Gail Crawford was informed that low Vitamin D levels contributes to fatigue and are associated with obesity, breast, and colon cancer. She agrees to continue taking prescription Vit D and will follow-up for routine testing of Vitamin D next month. She was informed of the risk of over-replacement of Vitamin D and agrees to not increase her dose unless she discusses this with [place] first. Gail Crawford agrees to follow-up with our clinic in 2 weeks.  I spent  > than 50% of the 15 minute visit on counseling as documented in the note.  Obesity Gail Crawford is currently in the action stage of change. As such, her goal is to continue with weight loss efforts. She has agreed to follow the Category 2 plan or the Pescatarian plan. Gail Crawford has been instructed to work up to a goal of 150 minutes of combined cardio and strengthening exercise per week for weight loss and overall health benefits. We discussed the following Behavioral Modification Strategies today: increasing lean protein intake, work on meal planning and easy cooking plans, and keeping healthy foods in the home.  Gail Crawford has agreed to follow-up with our clinic in 2 weeks. She was informed of the importance of frequent follow-up visits to maximize her success with intensive lifestyle modifications for her multiple health conditions.  ALLERGIES: Allergies  Allergen Reactions  . Lisinopril Cough  . Sulfa Antibiotics Nausea And Vomiting    MEDICATIONS: Current Outpatient Medications on File Prior to Visit  Medication Sig Dispense Refill  . chlorthalidone (HYGROTON) 25 MG tablet Take 1 tablet (25 mg total) by mouth daily. 30 tablet 0  . diclofenac (VOLTAREN) 75 MG EC tablet Take 75 mg by mouth 2 (two) times daily.    . fexofenadine (ALLEGRA) 180 MG tablet Take 180 mg by mouth daily.    Marland Kitchen losartan (COZAAR) 50 MG tablet Take 1 tablet (50 mg total) by mouth daily. 90 tablet 0  . metFORMIN (GLUCOPHAGE) 500 MG tablet Take 1 tablet (500 mg total) by  mouth daily with breakfast. 90 tablet 0  . Vitamin D, Ergocalciferol, (DRISDOL) 1.25 MG (50000 UT) CAPS capsule Take 1 capsule (50,000 Units total) by mouth every 7 (seven) days. 12 capsule 0   No current facility-administered medications on file prior to visit.     PAST MEDICAL HISTORY: Past Medical History:  Diagnosis Date  . Asthma 01-2004  . Back pain   . Carcinoma of fallopian tube, right ([facility]) 07-15-04  . HTN (hypertension)   . Joint  pain   . Leg edema     PAST SURGICAL HISTORY: Past Surgical History:  Procedure Laterality Date  . ABDOMINAL HYSTERECTOMY  07-15-04   TYPE II RADICAL HYSTERECTOMY WITH BSO  . [place]  . MASS EXCISION Left 06/11/2015   Procedure: EXCISION LEFT ABDOMINAL WALL MASS 6X3CM;  Surgeon: Stark Klein, MD;  Location: [facility];  Service: General;  Laterality: Left;  . ORTHOPEDIC SURGERY     RIGHT SHOULDER AND ELBOW AND RIGHT FOOT    SOCIAL HISTORY: Social History   Tobacco Use  . Smoking status: Never Smoker  . Smokeless tobacco: Never Used  Substance Use Topics  . Alcohol use: No  . Drug use: No    FAMILY HISTORY: Family History  Problem Relation Age of Onset  . Heart disease Mother   . Heart disease Father    ROS: Review of Systems  Gastrointestinal: Negative for nausea and vomiting.  Musculoskeletal:       Negative for muscle weakness.   PHYSICAL EXAM: Pt in no acute distress  RECENT LABS AND TESTS: BMET    Component Value Date/Time   NA 141 01/03/2019 0949   K 4.7 01/03/2019 0949   CL 101 01/03/2019 0949   CO2 24 01/03/2019 0949   GLUCOSE 99 01/03/2019 0949   GLUCOSE 92 01/15/2012 0908   BUN 14 01/03/2019 0949   CREATININE 0.80 01/03/2019 0949   CALCIUM 9.4 01/03/2019 0949   GFRNONAA 78 01/03/2019 0949   GFRAA 90 01/03/2019 0949   Lab Results  Component Value Date   HGBA1C 5.8 (H) 01/03/2019   HGBA1C 7.1 (H) 09/05/2018   Lab Results  Component Value Date   INSULIN 13.3 01/03/2019   INSULIN 15.8 09/05/2018   CBC    Component Value Date/Time   WBC 9.0 09/05/2018 0000   WBC 6.5 01/15/2012 0908   RBC 4.78 09/05/2018 0000   RBC 4.65 01/15/2012 0908   HGB 13.5 09/05/2018 0000   HGB 13.6 01/15/2012 0908   HCT 41.6 09/05/2018 0000   HCT 40.9 01/15/2012 0908   PLT 236 01/15/2012 0908   MCV 87 09/05/2018 0000   MCV 87.8 01/15/2012 0908   MCH 28.2 09/05/2018 0000   MCH 29.3 01/15/2012 0908   MCHC 32.5 09/05/2018 0000    MCHC 33.3 01/15/2012 0908   RDW 12.8 09/05/2018 0000   RDW 13.0 01/15/2012 0908   LYMPHSABS 2.6 09/05/2018 0000   LYMPHSABS 1.9 01/15/2012 0908   MONOABS 0.4 01/15/2012 0908   EOSABS 0.1 09/05/2018 0000   BASOSABS 0.1 09/05/2018 0000   BASOSABS 0.0 01/15/2012 0908   Iron/TIBC/Ferritin/ %Sat No results found for: IRON, TIBC, FERRITIN, IRONPCTSAT Lipid Panel     Component Value Date/Time   CHOL 177 01/03/2019 0949   TRIG 185 (H) 01/03/2019 0949   HDL 39 (L) 01/03/2019 0949   CHOLHDL 4.0 01/18/2006 0904   VLDL 28 01/18/2006 0904   LDLCALC 101 (H) 01/03/2019 0949   Hepatic Function Panel  Component Value Date/Time   PROT 7.0 01/03/2019 0949   ALBUMIN 4.4 01/03/2019 0949   AST 16 01/03/2019 0949   ALT 12 01/03/2019 0949   ALKPHOS 127 (H) 01/03/2019 0949   BILITOT 0.3 01/03/2019 0949      Component Value Date/Time   TSH 1.920 09/05/2018 0000   Results for LILLIAHNA, SCHUBRING (MRN [id number]) as of 04/03/2019 11:37  Ref. Range 01/03/2019 09:49  Vitamin D, 25-Hydroxy Latest Ref Range: 30.0 - 100.0 ng/mL 75.1    I, Michaelene Song, am acting as Location manager for Dennard Nip, MD I have reviewed the above documentation for accuracy and completeness, and I agree with the above. -Dennard Nip, MD

## 2019-04-13 ENCOUNTER — Telehealth (INDEPENDENT_AMBULATORY_CARE_PROVIDER_SITE_OTHER): Payer: Managed Care, Other (non HMO) | Admitting: Family Medicine

## 2019-04-13 ENCOUNTER — Other Ambulatory Visit: Payer: Self-pay

## 2019-04-13 ENCOUNTER — Encounter (INDEPENDENT_AMBULATORY_CARE_PROVIDER_SITE_OTHER): Payer: Self-pay | Admitting: Family Medicine

## 2019-04-13 DIAGNOSIS — I10 Essential (primary) hypertension: Secondary | ICD-10-CM | POA: Diagnosis not present

## 2019-04-13 DIAGNOSIS — E559 Vitamin D deficiency, unspecified: Secondary | ICD-10-CM | POA: Diagnosis not present

## 2019-04-13 DIAGNOSIS — E119 Type 2 diabetes mellitus without complications: Secondary | ICD-10-CM | POA: Diagnosis not present

## 2019-04-13 MED ORDER — METFORMIN HCL 500 MG PO TABS: 500.0000 mg | ORAL_TABLET | Freq: Two times a day (BID) | ORAL | 0 refills | Status: DC

## 2019-04-13 MED ORDER — VITAMIN D (ERGOCALCIFEROL) 1.25 MG (50000 UNIT) PO CAPS: 50000.0000 [IU] | ORAL_CAPSULE | ORAL | 0 refills | Status: DC

## 2019-04-13 MED ORDER — LOSARTAN POTASSIUM 50 MG PO TABS: 50.0000 mg | ORAL_TABLET | Freq: Every day | ORAL | 0 refills | Status: DC

## 2019-04-13 MED ORDER — CHLORTHALIDONE 25 MG PO TABS: 25.0000 mg | ORAL_TABLET | Freq: Every day | ORAL | 0 refills | Status: DC

## 2019-04-19 NOTE — Progress Notes (Signed)
Office: [phone]  /  Fax: [phone] TeleHealth Visit:  JOURNEY CASTONGUAY has verbally consented to this TeleHealth visit today. The patient is located at home, the provider is located at the [facility] office. The participants in this visit include the listed provider and patient. Raea was unable to use realtime audiovisual technology today and the telehealth visit was conducted via telephone.   HPI:   Chief Complaint: OBESITY Caprice is here to discuss her progress with her obesity treatment plan. She is on the Category 2 plan or follow the Pescatarian eating plan and is following her eating plan approximately 26 % of the time. She states she is exercising 0 minutes 0 times per week. Parisha feels she has done well maintaining her weight. She gained some weight on vacation, but she has gotten back on track and weight has gone down. She was given an option for another plan last visit, but decided to stay on Category 2.  We were unable to weigh the patient today for this TeleHealth visit. She feels as if she has maintained her weight since her last visit. She has lost 26 lbs since starting treatment with [place].  Hypertension CHAYANNE FILIPPI is a 63 y.o. female with hypertension. Elexus states her blood pressure this morning was 135/82 and she is stable on medications. She denies chest pain, headaches, or dizziness. She is working on weight loss to help control her blood pressure with the goal of decreasing her risk of heart attack and stroke.  Vitamin D Deficiency Naba has a diagnosis of vitamin D deficiency. She is stable on prescription Vit D, last level at goal. She denies nausea, vomiting or muscle weakness.  Diabetes II Marrietta has a diagnosis of diabetes type II. Onnie notes increased polyphagia in the afternoons. She is on metformin 500 mg q AM only at this point. She denies nausea, vomiting, or hypoglycemia. Last A1c was 5.8. She has been  working on intensive lifestyle modifications including diet, exercise, and weight loss to help control her blood glucose levels.  ASSESSMENT AND PLAN:  Essential hypertension - Plan: chlorthalidone (HYGROTON) 25 MG tablet, losartan (COZAAR) 50 MG tablet  Vitamin D deficiency - Plan: Vitamin D, Ergocalciferol, (DRISDOL) 1.25 MG (50000 UT) CAPS capsule  Type 2 diabetes mellitus without complication, without long-term current use of insulin ([facility]) - Plan: metFORMIN (GLUCOPHAGE) 500 MG tablet  PLAN:  Hypertension We discussed sodium restriction, working on healthy weight loss, and a regular exercise program as the means to achieve improved blood pressure control. Reilly agreed with this plan and agreed to follow up as directed. We will continue to monitor her blood pressure as well as her progress with the above lifestyle modifications. Aryonna agrees to continue taking losartan 50 mg q daily #90 day supply with no refills; and she agrees to continue taking chlorthalidone 25 mg #30 and we will refill for 1 month. She will watch for signs of hypotension as she continues her lifestyle modifications. Jacy agrees to follow up with our clinic in 3 weeks.  Vitamin D Deficiency Kaelynne was informed that low vitamin D levels contributes to fatigue and are associated with obesity, breast, and colon cancer. Bryant agrees to continue taking prescription Vit D 50,000 IU every week #12 with no refills. She will follow up for routine testing of vitamin D, at least 2-3 times per year. She was informed of the risk of over-replacement of vitamin D and agrees to not increase her dose unless she discusses  this with [place] first. Wilhelmena agrees to follow up with our clinic in 3 weeks.  Diabetes II Laurajean will continue with her diet prescription. She has been given extensive diabetes education by myself today including ideal fasting and post-prandial blood glucose readings, individual ideal Hgb A1c goals  and hypoglycemia prevention. We discussed the importance of good blood sugar control to decrease the likelihood of diabetic complications such as nephropathy, neuropathy, limb loss, blindness, coronary artery disease, and death. We discussed the importance of intensive lifestyle modification including diet, exercise and weight loss as the first line treatment for diabetes. Markeia agrees increase metformin to 500 mg BID #180 with no refills. Norena agrees to follow up with our clinic in 3 weeks.  I spent > than 50% of the 25 minute visit on counseling as documented in the note.  Obesity Shakena is currently in the action stage of change. As such, her goal is to continue with weight loss efforts She has agreed to follow the Category 2 plan Lasheena has been instructed to work up to a goal of 150 minutes of combined cardio and strengthening exercise per week for weight loss and overall health benefits. We discussed the following Behavioral Modification Strategies today: increasing vegetables, increase H20 intake, and better snacking choices   Jozy has agreed to follow up with our clinic in 3 weeks. She was informed of the importance of frequent follow up visits to maximize her success with intensive lifestyle modifications for her multiple health conditions.  ALLERGIES: Allergies  Allergen Reactions   Lisinopril Cough   Sulfa Antibiotics Nausea And Vomiting    MEDICATIONS: Current Outpatient Medications on File Prior to Visit  Medication Sig Dispense Refill   diclofenac (VOLTAREN) 75 MG EC tablet Take 75 mg by mouth 2 (two) times daily.     fexofenadine (ALLEGRA) 180 MG tablet Take 180 mg by mouth daily.     No current facility-administered medications on file prior to visit.     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   HTN (hypertension)    Joint pain     Leg edema     PAST SURGICAL HISTORY: Past Surgical History:  Procedure Laterality Date   ABDOMINAL HYSTERECTOMY  07-15-04   TYPE II RADICAL HYSTERECTOMY WITH BSO   CESAREAN SECTION  1978   MASS EXCISION Left 06/11/2015   Procedure: EXCISION LEFT ABDOMINAL WALL MASS 6X3CM;  Surgeon: Stark Klein, MD;  Location: [facility];  Service: General;  Laterality: Left;   ORTHOPEDIC SURGERY     RIGHT SHOULDER AND ELBOW AND RIGHT FOOT    SOCIAL HISTORY: Social History   Tobacco Use   Smoking status: Never Smoker   Smokeless tobacco: Never Used  Substance Use Topics   Alcohol use: No   Drug use: No    FAMILY HISTORY: Family History  Problem Relation Age of Onset   Heart disease Mother    Heart disease Father     ROS: Review of Systems  Constitutional: Negative for weight loss.  Cardiovascular: Negative for chest pain.  Gastrointestinal: Negative for nausea and vomiting.  Musculoskeletal:       Negative muscle weakness  Neurological: Negative for dizziness and headaches.  Endo/Heme/Allergies:       Positive polyphagia Negative hypoglycemia    PHYSICAL EXAM: Pt in no acute distress  RECENT LABS AND TESTS: BMET    Component  Value Date/Time   NA 141 01/03/2019 0949   K 4.7 01/03/2019 0949   CL 101 01/03/2019 0949   CO2 24 01/03/2019 0949   GLUCOSE 99 01/03/2019 0949   GLUCOSE 92 01/15/2012 0908   BUN 14 01/03/2019 0949   CREATININE 0.80 01/03/2019 0949   CALCIUM 9.4 01/03/2019 0949   GFRNONAA 78 01/03/2019 0949   GFRAA 90 01/03/2019 0949   Lab Results  Component Value Date   HGBA1C 5.8 (H) 01/03/2019   HGBA1C 7.1 (H) 09/05/2018   Lab Results  Component Value Date   INSULIN 13.3 01/03/2019   INSULIN 15.8 09/05/2018   CBC    Component Value Date/Time   WBC 9.0 09/05/2018 0000   WBC 6.5 01/15/2012 0908   RBC 4.78 09/05/2018 0000   RBC 4.65 01/15/2012 0908   HGB 13.5 09/05/2018 0000   HGB 13.6 01/15/2012 0908   HCT 41.6  09/05/2018 0000   HCT 40.9 01/15/2012 0908   PLT 236 01/15/2012 0908   MCV 87 09/05/2018 0000   MCV 87.8 01/15/2012 0908   MCH 28.2 09/05/2018 0000   MCH 29.3 01/15/2012 0908   MCHC 32.5 09/05/2018 0000   MCHC 33.3 01/15/2012 0908   RDW 12.8 09/05/2018 0000   RDW 13.0 01/15/2012 0908   LYMPHSABS 2.6 09/05/2018 0000   LYMPHSABS 1.9 01/15/2012 0908   MONOABS 0.4 01/15/2012 0908   EOSABS 0.1 09/05/2018 0000   BASOSABS 0.1 09/05/2018 0000   BASOSABS 0.0 01/15/2012 0908   Iron/TIBC/Ferritin/ %Sat No results found for: IRON, TIBC, FERRITIN, IRONPCTSAT Lipid Panel     Component Value Date/Time   CHOL 177 01/03/2019 0949   TRIG 185 (H) 01/03/2019 0949   HDL 39 (L) 01/03/2019 0949   CHOLHDL 4.0 01/18/2006 0904   VLDL 28 01/18/2006 0904   LDLCALC 101 (H) 01/03/2019 0949   Hepatic Function Panel     Component Value Date/Time   PROT 7.0 01/03/2019 0949   ALBUMIN 4.4 01/03/2019 0949   AST 16 01/03/2019 0949   ALT 12 01/03/2019 0949   ALKPHOS 127 (H) 01/03/2019 0949   BILITOT 0.3 01/03/2019 0949      Component Value Date/Time   TSH 1.920 09/05/2018 0000      I, Trixie Dredge, am acting as transcriptionist for Dennard Nip, MD I have reviewed the above documentation for accuracy and completeness, and I agree with the above. -Dennard Nip, MD

## 2019-05-03 ENCOUNTER — Encounter (INDEPENDENT_AMBULATORY_CARE_PROVIDER_SITE_OTHER): Payer: Self-pay | Admitting: Family Medicine

## 2019-05-03 ENCOUNTER — Other Ambulatory Visit: Payer: Self-pay

## 2019-05-03 ENCOUNTER — Telehealth (INDEPENDENT_AMBULATORY_CARE_PROVIDER_SITE_OTHER): Payer: Managed Care, Other (non HMO) | Admitting: Family Medicine

## 2019-05-03 DIAGNOSIS — Z6839 Body mass index (BMI) 39.0-39.9, adult: Secondary | ICD-10-CM | POA: Diagnosis not present

## 2019-05-03 DIAGNOSIS — I10 Essential (primary) hypertension: Secondary | ICD-10-CM

## 2019-05-04 NOTE — Progress Notes (Signed)
Office: [phone]  /  Fax: [phone] TeleHealth Visit:  Gail Crawford has verbally consented to this TeleHealth visit today. The patient is located at home, the provider is located at the [facility] office. The participants in this visit include the listed provider and patient. The visit was conducted today via telephone call (Doxy failed - changed to telephone call).  HPI:   Chief Complaint: OBESITY Gail Crawford is here to discuss her progress with her obesity treatment plan. She is on the Category 2 plan and is following her eating plan approximately 0% of the time. She states she is exercising 0 minutes 0 times per week. Mayia has not been following her diet prescription and thinks she has gained weight, but has not weighed herself. 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 She has increased emotional eating. We were unable to weigh the patient today for this TeleHealth visit. She feels as if she has probably gained weight since her last visit. She has lost 26 lbs since starting treatment with [place].  Hypertension Gail Crawford is a 63 y.o. female with hypertension. Gail Crawford has been stable on chlorthalidone, but states her insurance will not cover it. She asks if she can change to something cheaper. She denies chest pain or shortness of breath on exertion. She is working weight loss to help control her blood pressure with the goal of decreasing her risk of heart attack and stroke.  ASSESSMENT AND PLAN:  Essential hypertension  Class 2 severe obesity with serious comorbidity and body mass index (BMI) of 39.0 to 39.9 in adult, unspecified obesity type ([place])  PLAN:  Hypertension We discussed sodium restriction, working on healthy weight loss, and a regular exercise program as the means to achieve improved blood pressure control. Gail Crawford agreed with this plan  and agreed to follow up as directed. We will continue to monitor her blood pressure as well as her progress with the above lifestyle modifications. Gail Crawford is to see [place] for an in-office visit in 3 weeks and has enough medication until then. We will check her blood pressure in the office and see if we can change to HCTZ. She will watch for signs of hypotension as she continues her lifestyle modifications.  I spent > than 50% of the 15 minute visit on counseling as documented in the note.  Obesity Gail Crawford is currently in the action stage of change. As such, her goal is to continue with weight loss efforts. She has agreed to follow the Category 2 plan. Gail Crawford has been instructed to work up to a goal of 150 minutes of combined cardio and strengthening exercise per week for weight loss and overall health benefits. We discussed the following Behavioral Modification Strategies today: emotional eating strategies  Gail Crawford has agreed to follow-up with our clinic in 3 weeks. She was informed of the importance of frequent follow-up visits to maximize her success with intensive lifestyle modifications for her multiple health conditions.  ALLERGIES: Allergies  Allergen Reactions  . Lisinopril Cough  . Sulfa Antibiotics Nausea And Vomiting    MEDICATIONS: Current Outpatient Medications on File Prior to Visit  Medication Sig Dispense Refill  . chlorthalidone (HYGROTON) 25 MG tablet Take 1 tablet (25 mg total) by mouth daily. 30 tablet 0  . diclofenac (VOLTAREN) 75 MG EC tablet Take 75 mg by mouth 2 (two) times daily.    Marland Kitchen  fexofenadine (ALLEGRA) 180 MG tablet Take 180 mg by mouth daily.    Marland Kitchen losartan (COZAAR) 50 MG tablet Take 1 tablet (50 mg total) by mouth daily. 90 tablet 0  . metFORMIN (GLUCOPHAGE) 500 MG tablet Take 1 tablet (500 mg total) by mouth 2 (two) times daily with a meal. 180 tablet 0  . Vitamin D, Ergocalciferol, (DRISDOL) 1.25 MG (50000 UT) CAPS capsule Take 1 capsule (50,000  Units total) by mouth every 7 (seven) days. 12 capsule 0   No current facility-administered medications on file prior to visit.     PAST MEDICAL HISTORY: Past Medical History:  Diagnosis Date  . Asthma 01-2004  . Back pain   . 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 HTN (hypertension)   . Joint pain   . Leg edema     PAST SURGICAL HISTORY: Past Surgical History:  Procedure Laterality Date  . ABDOMINAL HYSTERECTOMY  07-15-04   TYPE II RADICAL HYSTERECTOMY WITH BSO  . [place]  . MASS EXCISION Left 06/11/2015   Procedure: EXCISION LEFT ABDOMINAL WALL MASS 6X3CM;  Surgeon: Stark Klein, MD;  Location: [facility];  Service: General;  Laterality: Left;  . ORTHOPEDIC SURGERY     RIGHT SHOULDER AND ELBOW AND RIGHT FOOT    SOCIAL HISTORY: Social History   Tobacco Use  . Smoking status: Never Smoker  . Smokeless tobacco: Never Used  Substance Use Topics  . Alcohol use: No  . Drug use: No    FAMILY HISTORY: Family History  Problem Relation Age of Onset  . Heart disease Mother   . Heart disease Father    ROS: Review of Systems  Respiratory: Negative for shortness of breath.   Cardiovascular: Negative for chest pain.   PHYSICAL EXAM: Pt in no acute distress  RECENT LABS AND TESTS: BMET    Component Value Date/Time   NA 141 01/03/2019 0949   K 4.7 01/03/2019 0949   CL 101 01/03/2019 0949   CO2 24 01/03/2019 0949   GLUCOSE 99 01/03/2019 0949   GLUCOSE 92 01/15/2012 0908   BUN 14 01/03/2019 0949   CREATININE 0.80 01/03/2019 0949   CALCIUM 9.4 01/03/2019 0949   GFRNONAA 78 01/03/2019 0949   GFRAA 90 01/03/2019 0949   Lab Results  Component Value Date   HGBA1C 5.8 (H) 01/03/2019   HGBA1C 7.1 (H) 09/05/2018   Lab Results  Component Value Date   INSULIN 13.3 01/03/2019   INSULIN 15.8 09/05/2018   CBC    Component Value Date/Time   WBC 9.0 09/05/2018 0000   WBC 6.5 01/15/2012 0908   RBC 4.78 09/05/2018 0000    RBC 4.65 01/15/2012 0908   HGB 13.5 09/05/2018 0000   HGB 13.6 01/15/2012 0908   HCT 41.6 09/05/2018 0000   HCT 40.9 01/15/2012 0908   PLT 236 01/15/2012 0908   MCV 87 09/05/2018 0000   MCV 87.8 01/15/2012 0908   MCH 28.2 09/05/2018 0000   MCH 29.3 01/15/2012 0908   MCHC 32.5 09/05/2018 0000   MCHC 33.3 01/15/2012 0908   RDW 12.8 09/05/2018 0000   RDW 13.0 01/15/2012 0908   LYMPHSABS 2.6 09/05/2018 0000   LYMPHSABS 1.9 01/15/2012 0908   MONOABS 0.4 01/15/2012 0908   EOSABS 0.1 09/05/2018 0000   BASOSABS 0.1 09/05/2018 0000   BASOSABS 0.0 01/15/2012 0908   Iron/TIBC/Ferritin/ %Sat No results found for: IRON, TIBC, FERRITIN, IRONPCTSAT Lipid Panel     Component Value Date/Time  CHOL 177 01/03/2019 0949   TRIG 185 (H) 01/03/2019 0949   HDL 39 (L) 01/03/2019 0949   CHOLHDL 4.0 01/18/2006 0904   VLDL 28 01/18/2006 0904   LDLCALC 101 (H) 01/03/2019 0949   Hepatic Function Panel     Component Value Date/Time   PROT 7.0 01/03/2019 0949   ALBUMIN 4.4 01/03/2019 0949   AST 16 01/03/2019 0949   ALT 12 01/03/2019 0949   ALKPHOS 127 (H) 01/03/2019 0949   BILITOT 0.3 01/03/2019 0949      Component Value Date/Time   TSH 1.920 09/05/2018 0000   Results for Gail Crawford, Gail Crawford (MRN [id number]) as of 05/04/2019 15:34  Ref. Range 01/03/2019 09:49  Vitamin D, 25-Hydroxy Latest Ref Range: 30.0 - 100.0 ng/mL 75.1   I, Michaelene Song, am acting as Location manager for Dennard Nip, MD I have reviewed the above documentation for accuracy and completeness, and I agree with the above. -Dennard Nip, MD

## 2019-05-25 ENCOUNTER — Ambulatory Visit (INDEPENDENT_AMBULATORY_CARE_PROVIDER_SITE_OTHER): Payer: Managed Care, Other (non HMO) | Admitting: Family Medicine

## 2019-05-25 ENCOUNTER — Other Ambulatory Visit: Payer: Self-pay

## 2019-05-25 VITALS — BP 116/64 | HR 79 | Temp 98.5°F | Ht 65.0 in | Wt 231.0 lb

## 2019-05-25 DIAGNOSIS — I10 Essential (primary) hypertension: Secondary | ICD-10-CM | POA: Diagnosis not present

## 2019-05-25 DIAGNOSIS — Z9189 Other specified personal risk factors, not elsewhere classified: Secondary | ICD-10-CM

## 2019-05-25 DIAGNOSIS — E119 Type 2 diabetes mellitus without complications: Secondary | ICD-10-CM

## 2019-05-25 DIAGNOSIS — E559 Vitamin D deficiency, unspecified: Secondary | ICD-10-CM | POA: Diagnosis not present

## 2019-05-26 LAB — LIPID PANEL WITH LDL/HDL RATIO
Cholesterol, Total: 231 mg/dL — ABNORMAL HIGH (ref 100–199)
HDL: 44 mg/dL (ref >39)
LDL Chol Calc (NIH): 155 mg/dL — ABNORMAL HIGH (ref 0–99)
LDL/HDL Ratio: 3.5 ratio — ABNORMAL HIGH (ref 0.0–3.2)
Triglycerides: 174 mg/dL — ABNORMAL HIGH (ref 0–149)
VLDL Cholesterol Cal: 32 mg/dL (ref 5–40)

## 2019-05-26 LAB — COMPREHENSIVE METABOLIC PANEL
ALT: 11 IU/L (ref 0–32)
AST: 16 IU/L (ref 0–40)
Albumin/Globulin Ratio: 1.8 (ref 1.2–2.2)
Albumin: 4.6 g/dL (ref 3.8–4.8)
Alkaline Phosphatase: 113 IU/L (ref 39–117)
BUN/Creatinine Ratio: 25 (ref 12–28)
BUN: 20 mg/dL (ref 8–27)
Bilirubin Total: 0.5 mg/dL (ref 0.0–1.2)
CO2: 25 mmol/L (ref 20–29)
Calcium: 9.4 mg/dL (ref 8.7–10.3)
Chloride: 100 mmol/L (ref 96–106)
Creatinine, Ser: 0.8 mg/dL (ref 0.57–1.00)
GFR calc Af Amer: 90 mL/min/{1.73_m2} (ref >59)
GFR calc non Af Amer: 78 mL/min/{1.73_m2} (ref >59)
Globulin, Total: 2.6 g/dL (ref 1.5–4.5)
Glucose: 104 mg/dL — ABNORMAL HIGH (ref 65–99)
Potassium: 3.8 mmol/L (ref 3.5–5.2)
Sodium: 142 mmol/L (ref 134–144)
Total Protein: 7.2 g/dL (ref 6.0–8.5)

## 2019-05-26 LAB — VITAMIN D 25 HYDROXY (VIT D DEFICIENCY, FRACTURES): Vit D, 25-Hydroxy: 41.2 ng/mL (ref 30.0–100.0)

## 2019-05-26 LAB — HEMOGLOBIN A1C
Est. average glucose Bld gHb Est-mCnc: 126 mg/dL
Hgb A1c MFr Bld: 6 % — ABNORMAL HIGH (ref 4.8–5.6)

## 2019-05-26 LAB — INSULIN, RANDOM: INSULIN: 8.6 u[IU]/mL (ref 2.6–24.9)

## 2019-05-29 MED ORDER — LOSARTAN POTASSIUM 50 MG PO TABS: 50.0000 mg | ORAL_TABLET | Freq: Every day | ORAL | 0 refills | Status: DC

## 2019-05-29 MED ORDER — VITAMIN D (ERGOCALCIFEROL) 1.25 MG (50000 UNIT) PO CAPS: 50000.0000 [IU] | ORAL_CAPSULE | ORAL | 0 refills | Status: DC

## 2019-05-29 MED ORDER — CHLORTHALIDONE 25 MG PO TABS: 25.0000 mg | ORAL_TABLET | Freq: Every day | ORAL | 0 refills | Status: DC

## 2019-05-29 NOTE — Progress Notes (Signed)
Office: [phone]  /  Fax: [phone]   HPI:   Chief Complaint: OBESITY Gail Crawford is here to discuss her progress with her obesity treatment plan. She is on the  follow the Category 2 plan and is following her eating plan approximately 30 % of the time. She states she is exercising 0 minutes 0 times per week. Gail Crawford has lost 9 lbs over the last 6 months since her last in office visit before COVID-19 pandemic. She has been on vacation recently and did more celebration eating over the last 2-3 weeks.   Her weight is 231 lb (104.8 kg) today and has had a weight loss of 9 pounds over a period of 6 months since her last in office visit. She has lost 35 lbs since starting treatment with [place].  Hypertension Gail Crawford is a 63 y.o. female with hypertension.  Gail Crawford denies chest pain, headache, dizziness, or shortness of breath on exertion. She is working weight loss to help control her blood pressure with the goal of decreasing her risk of heart attack and stroke. Gail Crawford blood pressure is currently controlled on medications, and is much improved with Chlorthalidone.   At risk for cardiovascular disease Gail Crawford is at a higher than average risk for cardiovascular disease due to obesity. She currently denies any chest pain.  Vitamin D deficiency Gail Crawford has a diagnosis of vitamin D deficiency. She is currently taking vit D and denies nausea, vomiting or muscle weakness.  Diabetes II Gail Crawford has a diagnosis of diabetes type II. Gail Crawford is still on Metformin. She denies any hypoglycemic episodes, nausea, or vomiting. Last A1c was 6.0.  She has been working on intensive lifestyle modifications including diet, exercise, and weight loss to help control her blood glucose levels. She is due for labs.   ASSESSMENT AND PLAN:  Essential hypertension - Plan: chlorthalidone (HYGROTON) 25 MG tablet, losartan (COZAAR) 50 MG tablet  Vitamin D deficiency - Plan: VITAMIN D  25 Hydroxy (Vit-D Deficiency, Fractures), Vitamin D, Ergocalciferol, (DRISDOL) 1.25 MG (50000 UT) CAPS capsule  Type 2 diabetes mellitus without complication, without long-term current use of insulin (HCC) - Plan: Comprehensive metabolic panel, Hemoglobin A1c, Insulin, random, Lipid Panel With LDL/HDL Ratio  At risk for heart disease  PLAN: Hypertension We discussed sodium restriction, working on healthy weight loss, and a regular exercise program as the means to achieve improved blood pressure control. W[place]etta agreed with this plan and agreed to follow up as directed. We will continue to monitor her blood pressure as well as her progress with the above lifestyle modifications. She will continue losartan 50 mg daily #30 with no refills and chlorthalidone 25 mg daily #30 with no refills sent today and will watch for signs of hypotension as she continues her lifestyle modifications.We will repeat labs today.   Vitamin D Deficiency Gail Crawford was informed that low vitamin D levels contributes to fatigue and are associated with obesity, breast, and colon cancer. She agrees to continue to take prescription Vit D @50 ,000 IU every week #4 with no refills and will follow up for routine testing of vitamin D, at least 2-3 times per year. She was informed of the risk of over-replacement of vitamin D and agrees to not increase her dose unless she discusses this with [place] first. Agrees to follow up with our clinic as directed. We will repeat labs today.   Diabetes II Gail Crawford has been given extensive diabetes education by myself today including ideal fasting and post-prandial blood glucose readings,  individual ideal HgA1c goals  and hypoglycemia prevention. We discussed the importance of good blood sugar control to decrease the likelihood of diabetic complications such as nephropathy, neuropathy, limb loss, blindness, coronary artery disease, and death. We discussed the importance of intensive lifestyle  modification including diet, exercise and weight loss as the first line treatment for diabetes. Dessie agrees to continue her diabetes medications and will follow up at the agreed upon time. We will repeat labs today.  Obesity Gail Crawford is currently in the action stage of change. As such, her goal is to continue with weight loss efforts She has agreed to follow the Category 2 plan with additional breakfast options.  Gail Crawford has been instructed to work up to a goal of 150 minutes of combined cardio and strengthening exercise per week for weight loss and overall health benefits. We discussed the following Behavioral Modification Strategies today: increasing lean protein intake and work on meal planning and easy cooking plans   Gail Crawford has agreed to follow up with our clinic in 2-3 weeks. She was informed of the importance of frequent follow up visits to maximize her success with intensive lifestyle modifications for her multiple health conditions.  ALLERGIES: Allergies  Allergen Reactions  . Lisinopril Cough  . Sulfa Antibiotics Nausea And Vomiting    MEDICATIONS: Current Outpatient Medications on File Prior to Visit  Medication Sig Dispense Refill  . diclofenac (VOLTAREN) 75 MG EC tablet Take 75 mg by mouth 2 (two) times daily.    . fexofenadine (ALLEGRA) 180 MG tablet Take 180 mg by mouth daily.    . metFORMIN (GLUCOPHAGE) 500 MG tablet Take 1 tablet (500 mg total) by mouth 2 (two) times daily with a meal. 180 tablet 0   No current facility-administered medications on file prior to visit.     PAST MEDICAL HISTORY: Past Medical History:  Diagnosis Date  . Asthma 01-2004  . Back pain   . 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 HTN (hypertension)   . Joint pain   . Leg edema     PAST SURGICAL HISTORY: Past Surgical History:  Procedure Laterality Date  . ABDOMINAL HYSTERECTOMY  07-15-04   TYPE II RADICAL HYSTERECTOMY WITH BSO  . [place]  .  MASS EXCISION Left 06/11/2015   Procedure: EXCISION LEFT ABDOMINAL WALL MASS 6X3CM;  Surgeon: Stark Klein, MD;  Location: [facility];  Service: General;  Laterality: Left;  . ORTHOPEDIC SURGERY     RIGHT SHOULDER AND ELBOW AND RIGHT FOOT    SOCIAL HISTORY: Social History   Tobacco Use  . Smoking status: Never Smoker  . Smokeless tobacco: Never Used  Substance Use Topics  . Alcohol use: No  . Drug use: No    FAMILY HISTORY: Family History  Problem Relation Age of Onset  . Heart disease Mother   . Heart disease Father     ROS: Review of Systems  Constitutional: Positive for weight loss.  Respiratory: Negative for shortness of breath.   Cardiovascular: Negative for chest pain.  Gastrointestinal: Negative for nausea and vomiting.  Musculoskeletal:       Negative for muscle weakness  Neurological: Negative for dizziness and headaches.  Endo/Heme/Allergies:       Negative for hypoglycemia     PHYSICAL EXAM: Blood pressure 116/64, pulse 79, temperature 98.5 F (36.9 C), temperature source Oral, height 5\' 5"  (1.651 m), weight 231 lb (104.8 kg), SpO2 99 %. Body mass index is 38.44 kg/m. Physical Exam Vitals  signs reviewed.  Constitutional:      Appearance: Normal appearance. She is obese.  HENT:     Head: Normocephalic.     Nose: Nose normal.  Cardiovascular:     Rate and Rhythm: Normal rate.     Pulses: Normal pulses.     Heart sounds: Normal heart sounds.  Pulmonary:     Effort: Pulmonary effort is normal.     Breath sounds: Normal breath sounds.  Neurological:     Mental Status: She is alert.     RECENT LABS AND TESTS: BMET    Component Value Date/Time   NA 142 05/25/2019 1523   K 3.8 05/25/2019 1523   CL 100 05/25/2019 1523   CO2 25 05/25/2019 1523   GLUCOSE 104 (H) 05/25/2019 1523   GLUCOSE 92 01/15/2012 0908   BUN 20 05/25/2019 1523   CREATININE 0.80 05/25/2019 1523   CALCIUM 9.4 05/25/2019 1523   GFRNONAA 78 05/25/2019 1523    GFRAA 90 05/25/2019 1523   Lab Results  Component Value Date   HGBA1C 6.0 (H) 05/25/2019   HGBA1C 5.8 (H) 01/03/2019   HGBA1C 7.1 (H) 09/05/2018   Lab Results  Component Value Date   INSULIN 8.6 05/25/2019   INSULIN 13.3 01/03/2019   INSULIN 15.8 09/05/2018   CBC    Component Value Date/Time   WBC 9.0 09/05/2018 0000   WBC 6.5 01/15/2012 0908   RBC 4.78 09/05/2018 0000   RBC 4.65 01/15/2012 0908   HGB 13.5 09/05/2018 0000   HGB 13.6 01/15/2012 0908   HCT 41.6 09/05/2018 0000   HCT 40.9 01/15/2012 0908   PLT 236 01/15/2012 0908   MCV 87 09/05/2018 0000   MCV 87.8 01/15/2012 0908   MCH 28.2 09/05/2018 0000   MCH 29.3 01/15/2012 0908   MCHC 32.5 09/05/2018 0000   MCHC 33.3 01/15/2012 0908   RDW 12.8 09/05/2018 0000   RDW 13.0 01/15/2012 0908   LYMPHSABS 2.6 09/05/2018 0000   LYMPHSABS 1.9 01/15/2012 0908   MONOABS 0.4 01/15/2012 0908   EOSABS 0.1 09/05/2018 0000   BASOSABS 0.1 09/05/2018 0000   BASOSABS 0.0 01/15/2012 0908   Iron/TIBC/Ferritin/ %Sat No results found for: IRON, TIBC, FERRITIN, IRONPCTSAT Lipid Panel     Component Value Date/Time   CHOL 231 (H) 05/25/2019 1523   TRIG 174 (H) 05/25/2019 1523   HDL 44 05/25/2019 1523   CHOLHDL 4.0 01/18/2006 0904   VLDL 28 01/18/2006 0904   LDLCALC 101 (H) 01/03/2019 0949   Hepatic Function Panel     Component Value Date/Time   PROT 7.2 05/25/2019 1523   ALBUMIN 4.6 05/25/2019 1523   AST 16 05/25/2019 1523   ALT 11 05/25/2019 1523   ALKPHOS 113 05/25/2019 1523   BILITOT 0.5 05/25/2019 1523      Component Value Date/Time   TSH 1.920 09/05/2018 0000     Ref. Range 05/25/2019 15:23  Vitamin D, 25-Hydroxy Latest Ref Range: 30.0 - 100.0 ng/mL 41.2     OBESITY BEHAVIORAL INTERVENTION VISIT  Today's visit was # 17   Starting weight: 266 lbs Starting date: 09/05/18 Today's weight : Weight: 231 lb (104.8 kg)  Today's date: 05/25/19 Total lbs lost to date: 35 lbs At least 15 minutes were spent on  discussing the following behavioral intervention visit.   ASK: We discussed the diagnosis of obesity with Gail Crawford today and Gail Crawford agreed to give [place] permission to discuss obesity behavioral modification therapy today.  ASSESS: Juanette has the diagnosis of  obesity and her BMI today is 38.44 Akeylah is in the action stage of change   ADVISE: Kimberle was educated on the multiple health risks of obesity as well as the benefit of weight loss to improve her health. She was advised of the need for long term treatment and the importance of lifestyle modifications to improve her current health and to decrease her risk of future health problems.  AGREE: Multiple dietary modification options and treatment options were discussed and  Lillyanah agreed to follow the recommendations documented in the above note.  ARRANGE: Disiree was educated on the importance of frequent visits to treat obesity as outlined per CMS and USPSTF guidelines and agreed to schedule her next follow up appointment today.  I, Renee Ramus, am acting as transcriptionist for Dennard Nip, MD  I have reviewed the above documentation for accuracy and completeness, and I agree with the above. -Dennard Nip, MD

## 2019-06-08 ENCOUNTER — Ambulatory Visit (INDEPENDENT_AMBULATORY_CARE_PROVIDER_SITE_OTHER): Payer: Managed Care, Other (non HMO) | Admitting: Family Medicine

## 2019-06-08 ENCOUNTER — Other Ambulatory Visit: Payer: Self-pay

## 2019-06-08 VITALS — BP 115/70 | HR 67 | Temp 98.3°F | Ht 65.0 in | Wt 231.0 lb

## 2019-06-08 DIAGNOSIS — E782 Mixed hyperlipidemia: Secondary | ICD-10-CM | POA: Diagnosis not present

## 2019-06-08 DIAGNOSIS — Z6838 Body mass index (BMI) 38.0-38.9, adult: Secondary | ICD-10-CM | POA: Diagnosis not present

## 2019-06-13 NOTE — Progress Notes (Signed)
Office: [phone]  /  Fax: [phone]   HPI:   Chief Complaint: OBESITY Gail Crawford is here to discuss her progress with her obesity treatment plan. She is on the Category 2 plan and is following her eating plan approximately 90 % of the time. She states she is exercising 0 minutes 0 times per week. Gail Crawford has stuck to the plan well. She is frustrated because her weight loss is slow. Gail Crawford has not eaten the eggs on the plan. She wants more breakfast options. Her weight is 231 lb (104.8 kg) today and she has maintained weight since her last visit. She has lost 35 lbs since starting treatment with [place].  Mixed Hyperlipidemia Gail Crawford has mixed hyperlipidemia and her ASCVD risk score is 12.3%. Her LDL has increased to 155 from 101. We discussed statin, but she declined. She has been trying to improve her cholesterol levels with intensive lifestyle modification including a low saturated fat diet, exercise and weight loss. She denies any chest pain or shortness of breath. The 10-year ASCVD risk score Gail Crawford DC Gail Crawford., et al., 2013) is: 12.3%   Values used to calculate the score:     Age: 63 years     Sex: Female     Is Non-Hispanic African American: No     Diabetic: Yes     Tobacco smoker: No     Systolic Blood Pressure: [id number]     Is BP treated: Yes     HDL Cholesterol: 44 mg/dL     Total Cholesterol: 231 mg/dL   ASSESSMENT AND PLAN:  Mixed hyperlipidemia  Class 2 severe obesity with serious comorbidity and body mass index (BMI) of 38.0 to 38.9 in adult, unspecified obesity type ([place])  PLAN:  Mixed Hyperlipidemia [place] was informed of the American Heart Association Guidelines emphasizing intensive lifestyle modifications as the first line treatment for hyperlipidemia. We discussed many lifestyle modifications today in depth, and Gail Crawford will continue with the meal plan. She will continue to work on decreasing saturated fats such as fatty red meat, butter and many fried  foods. She will also increase vegetables and lean protein in her diet and continue to work on exercise and weight loss efforts.  Obesity Gail Crawford is currently in the action stage of change. As such, her goal is to continue with weight loss efforts She has agreed to keep a food journal with 200 to 250 calories and 20 grams of protein at breakfast daily and follow the Category 2 plan Gail Crawford has been instructed to work up to a goal of 150 minutes of combined cardio and strengthening exercise per week for weight loss and overall health benefits. We discussed the following Behavioral Modification Strategies today: planning for success and work on meal planning and easy cooking plans  Handout for journaling was given to patient today.  Gail Crawford has agreed to follow up with our clinic in 2 weeks. She was informed of the importance of frequent follow up visits to maximize her success with intensive lifestyle modifications for her multiple health conditions.  ALLERGIES: Allergies  Allergen Reactions   Lisinopril Cough   Sulfa Antibiotics Nausea And Vomiting    MEDICATIONS: Current Outpatient Medications on File Prior to Visit  Medication Sig Dispense Refill   chlorthalidone (HYGROTON) 25 MG tablet Take 1 tablet (25 mg total) by mouth daily. 30 tablet 0   diclofenac (VOLTAREN) 75 MG EC tablet Take 75 mg by mouth 2 (two) times daily.     fexofenadine (ALLEGRA) 180 MG tablet Take  180 mg by mouth daily.     losartan (COZAAR) 50 MG tablet Take 1 tablet (50 mg total) by mouth daily. 30 tablet 0   metFORMIN (GLUCOPHAGE) 500 MG tablet Take 1 tablet (500 mg total) by mouth 2 (two) times daily with a meal. 180 tablet 0   Vitamin D, Ergocalciferol, (DRISDOL) 1.25 MG (50000 UT) CAPS capsule Take 1 capsule (50,000 Units total) by mouth every 7 (seven) days. 4 capsule 0   No current facility-administered medications on file prior to visit.     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   HTN (hypertension)    Joint pain    Leg edema     PAST SURGICAL HISTORY: Past Surgical History:  Procedure Laterality Date   ABDOMINAL HYSTERECTOMY  07-15-04   TYPE II RADICAL HYSTERECTOMY WITH BSO   CESAREAN SECTION  1978   MASS EXCISION Left 06/11/2015   Procedure: EXCISION LEFT ABDOMINAL WALL MASS 6X3CM;  Surgeon: Stark Klein, MD;  Location: [place];  Service: General;  Laterality: Left;   ORTHOPEDIC SURGERY     RIGHT SHOULDER AND ELBOW AND RIGHT FOOT    SOCIAL HISTORY: Social History   Tobacco Use   Smoking status: Never Smoker   Smokeless tobacco: Never Used  Substance Use Topics   Alcohol use: No   Drug use: No    FAMILY HISTORY: Family History  Problem Relation Age of Onset   Heart disease Mother    Heart disease Father     ROS: Review of Systems  Constitutional: Negative for weight loss.  Respiratory: Negative for shortness of breath.   Cardiovascular: Negative for chest pain.    PHYSICAL EXAM: Blood pressure 115/70, pulse 67, temperature 98.3 F (36.8 C), temperature source Oral, height 5\' 5"  (1.651 m), weight 231 lb (104.8 kg), SpO2 98 %. Body mass index is 38.44 kg/m. Physical Exam Vitals signs reviewed.  Constitutional:      Appearance: Normal appearance. She is well-developed. She is obese.  Cardiovascular:     Rate and Rhythm: Normal rate.  Pulmonary:     Effort: Pulmonary effort is normal.  Musculoskeletal: Normal range of motion.  Skin:    General: Skin is warm and dry.  Neurological:     Mental Status: She is alert and oriented to person, place, and time.  Psychiatric:        Mood and Affect: Mood normal.        Behavior: Behavior normal.     RECENT LABS AND TESTS: BMET    Component Value Date/Time   NA 142 05/25/2019 1523   K 3.8 05/25/2019 1523   CL 100 05/25/2019 1523   CO2 25 05/25/2019  1523   GLUCOSE 104 (H) 05/25/2019 1523   GLUCOSE 92 01/15/2012 0908   BUN 20 05/25/2019 1523   CREATININE 0.80 05/25/2019 1523   CALCIUM 9.4 05/25/2019 1523   GFRNONAA 78 05/25/2019 1523   GFRAA 90 05/25/2019 1523   Lab Results  Component Value Date   HGBA1C 6.0 (H) 05/25/2019   HGBA1C 5.8 (H) 01/03/2019   HGBA1C 7.1 (H) 09/05/2018   Lab Results  Component Value Date   INSULIN 8.6 05/25/2019   INSULIN 13.3 01/03/2019   INSULIN 15.8 09/05/2018   CBC    Component Value Date/Time   WBC 9.0 09/05/2018 0000   WBC 6.5 01/15/2012 0908  RBC 4.78 09/05/2018 0000   RBC 4.65 01/15/2012 0908   HGB 13.5 09/05/2018 0000   HGB 13.6 01/15/2012 0908   HCT 41.6 09/05/2018 0000   HCT 40.9 01/15/2012 0908   PLT 236 01/15/2012 0908   MCV 87 09/05/2018 0000   MCV 87.8 01/15/2012 0908   MCH 28.2 09/05/2018 0000   MCH 29.3 01/15/2012 0908   MCHC 32.5 09/05/2018 0000   MCHC 33.3 01/15/2012 0908   RDW 12.8 09/05/2018 0000   RDW 13.0 01/15/2012 0908   LYMPHSABS 2.6 09/05/2018 0000   LYMPHSABS 1.9 01/15/2012 0908   MONOABS 0.4 01/15/2012 0908   EOSABS 0.1 09/05/2018 0000   BASOSABS 0.1 09/05/2018 0000   BASOSABS 0.0 01/15/2012 0908   Iron/TIBC/Ferritin/ %Sat No results found for: IRON, TIBC, FERRITIN, IRONPCTSAT Lipid Panel     Component Value Date/Time   CHOL 231 (H) 05/25/2019 1523   TRIG 174 (H) 05/25/2019 1523   HDL 44 05/25/2019 1523   CHOLHDL 4.0 01/18/2006 0904   VLDL 28 01/18/2006 0904   LDLCALC 155 (H) 05/25/2019 1523   Hepatic Function Panel     Component Value Date/Time   PROT 7.2 05/25/2019 1523   ALBUMIN 4.6 05/25/2019 1523   AST 16 05/25/2019 1523   ALT 11 05/25/2019 1523   ALKPHOS 113 05/25/2019 1523   BILITOT 0.5 05/25/2019 1523      Component Value Date/Time   TSH 1.920 09/05/2018 0000     Ref. Range 05/25/2019 15:23  Vitamin D, 25-Hydroxy Latest Ref Range: 30.0 - 100.0 ng/mL 41.2    OBESITY BEHAVIORAL INTERVENTION VISIT  Today's visit was # 18    Starting weight: 266 lbs Starting date: 09/05/2018 Today's weight : 231 lbs Today's date: 06/08/2019 Total lbs lost to date: 35    06/08/2019  Height 5\' 5"  (1.651 m)  Weight 231 lb (104.8 kg)  BMI (Calculated) 38.44  BLOOD PRESSURE - SYSTOLIC [id number]  BLOOD PRESSURE - DIASTOLIC 70   Body Fat % 123456 %  Total Body Water (lbs) 83 lbs    ASK: We discussed the diagnosis of obesity with Gail Crawford today and Gail Crawford agreed to give [place] permission to discuss obesity behavioral modification therapy today.  ASSESS: Gail Crawford has the diagnosis of obesity and her BMI today is 38.44 Khawla is in the action stage of change   ADVISE: Gail Crawford was educated on the multiple health risks of obesity as well as the benefit of weight loss to improve her health. She was advised of the need for long term treatment and the importance of lifestyle modifications to improve her current health and to decrease her risk of future health problems.  AGREE: Multiple dietary modification options and treatment options were discussed and  Gail Crawford agreed to follow the recommendations documented in the above note.  ARRANGE: Gail Crawford was educated on the importance of frequent visits to treat obesity as outlined per CMS and USPSTF guidelines and agreed to schedule her next follow up appointment today.  I, Doreene Nest, am acting as transcriptionist for [name], FNP-C  I have reviewed the above documentation for accuracy and completeness, and I agree with the above.  - Allon Costlow, FNP-C.

## 2019-06-14 ENCOUNTER — Encounter (INDEPENDENT_AMBULATORY_CARE_PROVIDER_SITE_OTHER): Payer: Self-pay | Admitting: Family Medicine

## 2019-06-14 DIAGNOSIS — E782 Mixed hyperlipidemia: Secondary | ICD-10-CM | POA: Insufficient documentation

## 2019-06-14 DIAGNOSIS — E1169 Type 2 diabetes mellitus with other specified complication: Secondary | ICD-10-CM | POA: Insufficient documentation

## 2019-06-26 ENCOUNTER — Encounter (INDEPENDENT_AMBULATORY_CARE_PROVIDER_SITE_OTHER): Payer: Self-pay | Admitting: Family Medicine

## 2019-06-26 ENCOUNTER — Ambulatory Visit (INDEPENDENT_AMBULATORY_CARE_PROVIDER_SITE_OTHER): Payer: Managed Care, Other (non HMO) | Admitting: Family Medicine

## 2019-06-26 ENCOUNTER — Other Ambulatory Visit: Payer: Self-pay

## 2019-06-26 VITALS — BP 112/71 | HR 75 | Temp 98.2°F | Ht 65.0 in | Wt 234.0 lb

## 2019-06-26 DIAGNOSIS — Z9189 Other specified personal risk factors, not elsewhere classified: Secondary | ICD-10-CM | POA: Diagnosis not present

## 2019-06-26 DIAGNOSIS — E559 Vitamin D deficiency, unspecified: Secondary | ICD-10-CM | POA: Diagnosis not present

## 2019-06-26 DIAGNOSIS — E119 Type 2 diabetes mellitus without complications: Secondary | ICD-10-CM | POA: Diagnosis not present

## 2019-06-26 DIAGNOSIS — Z6839 Body mass index (BMI) 39.0-39.9, adult: Secondary | ICD-10-CM

## 2019-06-26 MED ORDER — VITAMIN D (ERGOCALCIFEROL) 1.25 MG (50000 UNIT) PO CAPS: 50000.0000 [IU] | ORAL_CAPSULE | ORAL | 0 refills | Status: DC

## 2019-06-26 MED ORDER — METFORMIN HCL 500 MG PO TABS: 500.0000 mg | ORAL_TABLET | Freq: Two times a day (BID) | ORAL | 0 refills | Status: DC

## 2019-06-26 NOTE — Progress Notes (Signed)
Office: [phone]  /  Fax: [phone]   HPI:   Chief Complaint: OBESITY Gail Crawford is here to discuss her progress with her obesity treatment plan. She is on the keep a food journal with 200-250 calories and 20 grams of protein at breakfast daily and follow the Category 2 plan and is following her eating plan approximately 90 % of the time. She states she is exercising 0 minutes 0 times per week. Gail Crawford was completely off her routine over the last 3 weeks. She ate out more but made better choices. Most of her weight gain is water weight.  Her weight is 234 lb (106.1 kg) today and has gained 3 lbs since her last visit. She has lost 32 lbs since starting treatment with [place].  Diabetes II Gail Crawford has a diagnosis of diabetes type II. Gail Crawford is tolerating metformin well, but she still notes polyphagia. She denies hypoglycemia. Last A1c was 6.0. She has been working on intensive lifestyle modifications including diet, exercise, and weight loss to help control her blood glucose levels.  Vitamin D Deficiency Gail Crawford has a diagnosis of vitamin D deficiency. She is stable on prescription Vit D, but level is not yet at goal. She denies nausea, vomiting or muscle weakness.  At risk for cardiovascular disease Gail Crawford is at a higher than average risk for cardiovascular disease due to obesity. She currently denies any chest pain. ASSESSMENT AND PLAN:  Type 2 diabetes mellitus without complication, without long-term current use of insulin ([place]) - Plan: metFORMIN (GLUCOPHAGE) 500 MG tablet  Vitamin D deficiency - Plan: Vitamin D, Ergocalciferol, (DRISDOL) 1.25 MG (50000 UT) CAPS capsule  Class 2 severe obesity with serious comorbidity and body mass index (BMI) of 39.0 to 39.9 in adult, unspecified obesity type ([place])  PLAN:  Diabetes II Gail Crawford has been given extensive diabetes education by myself today including ideal fasting and post-prandial blood glucose readings, individual ideal  Hgb A1c goals and hypoglycemia prevention. We discussed the importance of good blood sugar control to decrease the likelihood of diabetic complications such as nephropathy, neuropathy, limb loss, blindness, coronary artery disease, and death. We discussed the importance of intensive lifestyle modification including diet, exercise and weight loss as the first line treatment for diabetes. Gail Crawford agrees to increase metformin 500 mg PO BID #60 with no refills. Gail Crawford agrees to follow up with our clinic in 2 to 3 weeks.   Vitamin D Deficiency Gail Crawford was informed that low vitamin D levels contributes to fatigue and are associated with obesity, breast, and colon cancer. Gail Crawford agrees to continue taking prescription Vit D 50,000 IU every week #4 and we will refill for 1 month. She will follow up for routine testing of vitamin D, at least 2-3 times per year. She was informed of the risk of over-replacement of vitamin D and agrees to not increase her dose unless she discusses this with [place] first. Gail Crawford agrees to follow up with our clinic in 2 to 3 weeks.  Cardiovascular risk counselling Gail Crawford was given extended (15 minutes) coronary artery disease prevention counseling today. She is 63 y.o. female and has risk factors for heart disease including obesity. We discussed intensive lifestyle modifications today with an emphasis on specific weight loss instructions and strategies. Pt was also informed of the importance of increasing exercise and decreasing saturated fats to help prevent heart disease. Obesity Gail Crawford is currently in the action stage of change. As such, her goal is to continue with weight loss efforts She has agreed to keep a  food journal with 200-250 calories and 20+ grams of protein at breakfast daily and follow the Category 2 plan Gail Crawford has been instructed to work up to a goal of 150 minutes of combined cardio and strengthening exercise per week for weight loss and overall  health benefits. We discussed the following Behavioral Modification Strategies today: increasing lean protein intake and work on meal planning and easy cooking plans   Gail Crawford has agreed to follow up with our clinic in 2 to 3 weeks. She was informed of the importance of frequent follow up visits to maximize her success with intensive lifestyle modifications for her multiple health conditions.  ALLERGIES: Allergies  Allergen Reactions  . Lisinopril Cough  . Sulfa Antibiotics Nausea And Vomiting    MEDICATIONS: Current Outpatient Medications on File Prior to Visit  Medication Sig Dispense Refill  . chlorthalidone (HYGROTON) 25 MG tablet Take 1 tablet (25 mg total) by mouth daily. 30 tablet 0  . diclofenac (VOLTAREN) 75 MG EC tablet Take 75 mg by mouth 2 (two) times daily.    . fexofenadine (ALLEGRA) 180 MG tablet Take 180 mg by mouth daily.    Marland Kitchen losartan (COZAAR) 50 MG tablet Take 1 tablet (50 mg total) by mouth daily. 30 tablet 0   No current facility-administered medications on file prior to visit.     PAST MEDICAL HISTORY: Past Medical History:  Diagnosis Date  . Asthma 01-2004  . Back pain   . 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 HTN (hypertension)   . Joint pain   . Leg edema     PAST SURGICAL HISTORY: Past Surgical History:  Procedure Laterality Date  . ABDOMINAL HYSTERECTOMY  07-15-04   TYPE II RADICAL HYSTERECTOMY WITH BSO  . [place]  . MASS EXCISION Left 06/11/2015   Procedure: EXCISION LEFT ABDOMINAL WALL MASS 6X3CM;  Surgeon: Stark Klein, MD;  Location: [facility];  Service: General;  Laterality: Left;  . ORTHOPEDIC SURGERY     RIGHT SHOULDER AND ELBOW AND RIGHT FOOT    SOCIAL HISTORY: Social History   Tobacco Use  . Smoking status: Never Smoker  . Smokeless tobacco: Never Used  Substance Use Topics  . Alcohol use: No  . Drug use: No    FAMILY HISTORY: Family History  Problem Relation Age of Onset   . Heart disease Mother   . Heart disease Father     ROS: Review of Systems  Constitutional: Negative for weight loss.  Gastrointestinal: Negative for nausea and vomiting.  Musculoskeletal:       Negative muscle weakness  Endo/Heme/Allergies:       Positive polyphagia Negative hypoglycemia    PHYSICAL EXAM: Blood pressure 112/71, pulse 75, temperature 98.2 F (36.8 C), temperature source Oral, height 5\' 5"  (1.651 m), weight 234 lb (106.1 kg), SpO2 98 %. Body mass index is 38.94 kg/m. Physical Exam Vitals signs reviewed.  Constitutional:      Appearance: Normal appearance. She is obese.  Cardiovascular:     Rate and Rhythm: Normal rate.     Pulses: Normal pulses.  Pulmonary:     Effort: Pulmonary effort is normal.     Breath sounds: Normal breath sounds.  Musculoskeletal: Normal range of motion.  Skin:    General: Skin is warm and dry.  Neurological:     Mental Status: She is alert and oriented to person, place, and time.  Psychiatric:        Mood and Affect: Mood normal.  Behavior: Behavior normal.     RECENT LABS AND TESTS: BMET    Component Value Date/Time   NA 142 05/25/2019 1523   K 3.8 05/25/2019 1523   CL 100 05/25/2019 1523   CO2 25 05/25/2019 1523   GLUCOSE 104 (H) 05/25/2019 1523   GLUCOSE 92 01/15/2012 0908   BUN 20 05/25/2019 1523   CREATININE 0.80 05/25/2019 1523   CALCIUM 9.4 05/25/2019 1523   GFRNONAA 78 05/25/2019 1523   GFRAA 90 05/25/2019 1523   Lab Results  Component Value Date   HGBA1C 6.0 (H) 05/25/2019   HGBA1C 5.8 (H) 01/03/2019   HGBA1C 7.1 (H) 09/05/2018   Lab Results  Component Value Date   INSULIN 8.6 05/25/2019   INSULIN 13.3 01/03/2019   INSULIN 15.8 09/05/2018   CBC    Component Value Date/Time   WBC 9.0 09/05/2018 0000   WBC 6.5 01/15/2012 0908   RBC 4.78 09/05/2018 0000   RBC 4.65 01/15/2012 0908   HGB 13.5 09/05/2018 0000   HGB 13.6 01/15/2012 0908   HCT 41.6 09/05/2018 0000   HCT 40.9 01/15/2012 0908    PLT 236 01/15/2012 0908   MCV 87 09/05/2018 0000   MCV 87.8 01/15/2012 0908   MCH 28.2 09/05/2018 0000   MCH 29.3 01/15/2012 0908   MCHC 32.5 09/05/2018 0000   MCHC 33.3 01/15/2012 0908   RDW 12.8 09/05/2018 0000   RDW 13.0 01/15/2012 0908   LYMPHSABS 2.6 09/05/2018 0000   LYMPHSABS 1.9 01/15/2012 0908   MONOABS 0.4 01/15/2012 0908   EOSABS 0.1 09/05/2018 0000   BASOSABS 0.1 09/05/2018 0000   BASOSABS 0.0 01/15/2012 0908   Iron/TIBC/Ferritin/ %Sat No results found for: IRON, TIBC, FERRITIN, IRONPCTSAT Lipid Panel     Component Value Date/Time   CHOL 231 (H) 05/25/2019 1523   TRIG 174 (H) 05/25/2019 1523   HDL 44 05/25/2019 1523   CHOLHDL 4.0 01/18/2006 0904   VLDL 28 01/18/2006 0904   LDLCALC 155 (H) 05/25/2019 1523   Hepatic Function Panel     Component Value Date/Time   PROT 7.2 05/25/2019 1523   ALBUMIN 4.6 05/25/2019 1523   AST 16 05/25/2019 1523   ALT 11 05/25/2019 1523   ALKPHOS 113 05/25/2019 1523   BILITOT 0.5 05/25/2019 1523      Component Value Date/Time   TSH 1.920 09/05/2018 0000      OBESITY BEHAVIORAL INTERVENTION VISIT  Today's visit was # 19   Starting weight: 260 lbs Starting date: 09/05/2018 Today's weight : 234 lbs Today's date: 06/26/2019 Total lbs lost to date: 74    ASK: We discussed the diagnosis of obesity with Gail Crawford today and Gail Crawford agreed to give [place] permission to discuss obesity behavioral modification therapy today.  ASSESS: Gail Crawford has the diagnosis of obesity and her BMI today is 38.94 Gail Crawford is in the action stage of change   ADVISE: Gail Crawford was educated on the multiple health risks of obesity as well as the benefit of weight loss to improve her health. She was advised of the need for long term treatment and the importance of lifestyle modifications to improve her current health and to decrease her risk of future health problems.  AGREE: Multiple dietary modification options and treatment  options were discussed and  Gail Crawford agreed to follow the recommendations documented in the above note.  ARRANGE: Gail Crawford was educated on the importance of frequent visits to treat obesity as outlined per CMS and USPSTF guidelines and agreed to schedule her next  follow up appointment today.  I, Trixie Dredge, am acting as transcriptionist for Dennard Nip, MD  I have reviewed the above documentation for accuracy and completeness, and I agree with the above. -Dennard Nip, MD

## 2019-07-01 ENCOUNTER — Other Ambulatory Visit (INDEPENDENT_AMBULATORY_CARE_PROVIDER_SITE_OTHER): Payer: Self-pay | Admitting: Family Medicine

## 2019-07-01 DIAGNOSIS — I10 Essential (primary) hypertension: Secondary | ICD-10-CM

## 2019-07-03 MED ORDER — CHLORTHALIDONE 25 MG PO TABS: 25.0000 mg | ORAL_TABLET | Freq: Every day | ORAL | 0 refills | Status: DC

## 2019-07-17 ENCOUNTER — Ambulatory Visit (INDEPENDENT_AMBULATORY_CARE_PROVIDER_SITE_OTHER): Payer: Managed Care, Other (non HMO) | Admitting: Family Medicine

## 2019-07-17 ENCOUNTER — Encounter (INDEPENDENT_AMBULATORY_CARE_PROVIDER_SITE_OTHER): Payer: Self-pay | Admitting: Family Medicine

## 2019-07-17 ENCOUNTER — Other Ambulatory Visit: Payer: Self-pay

## 2019-07-17 VITALS — BP 109/68 | HR 90 | Temp 97.7°F | Ht 65.0 in | Wt 231.0 lb

## 2019-07-17 DIAGNOSIS — E559 Vitamin D deficiency, unspecified: Secondary | ICD-10-CM | POA: Diagnosis not present

## 2019-07-17 DIAGNOSIS — I10 Essential (primary) hypertension: Secondary | ICD-10-CM

## 2019-07-17 DIAGNOSIS — Z9189 Other specified personal risk factors, not elsewhere classified: Secondary | ICD-10-CM | POA: Diagnosis not present

## 2019-07-17 DIAGNOSIS — E66812 Obesity, class 2: Secondary | ICD-10-CM

## 2019-07-17 DIAGNOSIS — E119 Type 2 diabetes mellitus without complications: Secondary | ICD-10-CM | POA: Diagnosis not present

## 2019-07-17 DIAGNOSIS — Z6838 Body mass index (BMI) 38.0-38.9, adult: Secondary | ICD-10-CM

## 2019-07-17 MED ORDER — METFORMIN HCL 500 MG PO TABS: 500.0000 mg | ORAL_TABLET | Freq: Two times a day (BID) | ORAL | 0 refills | Status: DC

## 2019-07-17 MED ORDER — LOSARTAN POTASSIUM 50 MG PO TABS: 50.0000 mg | ORAL_TABLET | Freq: Every day | ORAL | 0 refills | Status: DC

## 2019-07-17 MED ORDER — VITAMIN D (ERGOCALCIFEROL) 1.25 MG (50000 UNIT) PO CAPS: 50000.0000 [IU] | ORAL_CAPSULE | ORAL | 0 refills | Status: DC

## 2019-07-17 MED ORDER — CHLORTHALIDONE 25 MG PO TABS: 25.0000 mg | ORAL_TABLET | Freq: Every day | ORAL | 0 refills | Status: DC

## 2019-07-19 NOTE — Progress Notes (Signed)
Office: [phone]  /  Fax: [phone]   HPI:   Chief Complaint: OBESITY Gail Crawford is here to discuss her progress with her obesity treatment plan. She is on the keep a food journal with 200-250 calories and 20+ grams of protein at breakfast daily and follow the Category 2 plan and is following her eating plan approximately 50 % of the time. She states she is exercising 0 minutes 0 times per week. Koi continues to do well with weight loss on her eating plan. Her hunger is controlled, but she notes fatigue has increased. She would like to discuss Thanksgiving strategies.  Her weight is 231 lb (104.8 kg) today and has had a weight loss of 3 pounds over a period of 3 weeks since her last visit. She has lost 35 lbs since starting treatment with [place].  Diabetes II Gail Crawford has a diagnosis of diabetes type II. Horace is working on diet and is doing well with blood sugar control. She did not bring in her blood sugar log today, but last A1c was at goal. She denies hypoglycemia.  Vitamin D Deficiency Gail Crawford has a diagnosis of vitamin D deficiency. Her Vit D level has fallen and is no longer at goal. She notes fatigue and denies nausea, vomiting or muscle weakness.  At risk for osteopenia and osteoporosis Gail Crawford is at higher risk of osteopenia and osteoporosis due to vitamin D deficiency.   Hypertension Gail Crawford is a 63 y.o. female with hypertension. [name] denies chest pain. She is working on weight loss to help control her blood pressure with the goal of decreasing her risk of heart attack and stroke. Gail Crawford's blood pressure is currently controlled.  ASSESSMENT AND PLAN:  Type 2 diabetes mellitus without complication, without long-term current use of insulin ([place]) - Plan: metFORMIN (GLUCOPHAGE) 500 MG tablet  Vitamin D deficiency - Plan: Vitamin D, Ergocalciferol, (DRISDOL) 1.25 MG (50000 UT) CAPS capsule  Essential hypertension - Plan: chlorthalidone  (HYGROTON) 25 MG tablet, losartan (COZAAR) 50 MG tablet  At risk for osteoporosis  Class 2 severe obesity with serious comorbidity and body mass index (BMI) of 38.0 to 38.9 in adult, unspecified obesity type ([place])  PLAN:  Diabetes II Gail Crawford has been given extensive diabetes education by myself today including ideal fasting and post-prandial blood glucose readings, individual ideal Hgb A1c goals and hypoglycemia prevention. We discussed the importance of good blood sugar control to decrease the likelihood of diabetic complications such as nephropathy, neuropathy, limb loss, blindness, coronary artery disease, and death. We discussed the importance of intensive lifestyle modification including diet, exercise and weight loss as the first line treatment for diabetes. Gail Crawford agrees to continue taking metformin 500 mg PO BID #60 and we will refill for 1 month. We will recheck labs next month. Henretter agrees to follow up with our clinic in 3 weeks.  Vitamin D Deficiency Stacyann was informed that low vitamin D levels contributes to fatigue and are associated with obesity, breast, and colon cancer. Gail Crawford agrees to continue taking prescription Vit D 50,000 IU every week #4 and we will refill for 1 month. She will follow up for routine testing of vitamin D, at least 2-3 times per year. She was informed of the risk of over-replacement of vitamin D and agrees to not increase her dose unless she discusses this with [place] first. We will recheck labs in 1 month. Gail Crawford agrees to follow up with our clinic in 3 weeks.  At risk for osteopenia and osteoporosis Gail Crawford was  given extended (15 minutes) osteoporosis prevention counseling today. Gail Crawford is at risk for osteopenia and osteoporsis due to her vitamin D deficiency. She was encouraged to take her vitamin D and follow her higher calcium diet and increase strengthening exercise to help strengthen her bones and decrease her risk of osteopenia and  osteoporosis.  Hypertension We discussed sodium restriction, working on healthy weight loss, and a regular exercise program as the means to achieve improved blood pressure control. Gail Crawford agreed with this plan and agreed to follow up as directed. We will continue to monitor her blood pressure as well as her progress with the above lifestyle modifications. Gail Crawford agrees to continue taking chlorthalidone 25 mg PO q daily #30 and we will refill for 1 month; and she agrees to continue taking losartan 50 mg PO q daily #30 and we will refill for 1 month. She will watch for signs of hypotension as she continues her lifestyle modifications. Gail Crawford agrees to follow up with our clinic in 3 weeks.  Obesity Gail Crawford is currently in the action stage of change. As such, her goal is to continue with weight loss efforts She has agreed to follow the Category 2 plan with shredded chicken recipes Gail Crawford has been instructed to work up to a goal of 150 minutes of combined cardio and strengthening exercise per week for weight loss and overall health benefits. We discussed the following Behavioral Modification Strategies today: increasing lean protein intake and holiday eating strategies    Gail Crawford has agreed to follow up with our clinic in 3 weeks. She was informed of the importance of frequent follow up visits to maximize her success with intensive lifestyle modifications for her multiple health conditions.  ALLERGIES: Allergies  Allergen Reactions  . Lisinopril Cough  . Sulfa Antibiotics Nausea And Vomiting    MEDICATIONS: Current Outpatient Medications on File Prior to Visit  Medication Sig Dispense Refill  . diclofenac (VOLTAREN) 75 MG EC tablet Take 75 mg by mouth 2 (two) times daily.    . fexofenadine (ALLEGRA) 180 MG tablet Take 180 mg by mouth daily.     No current facility-administered medications on file prior to visit.     PAST MEDICAL HISTORY: Past Medical History:  Diagnosis  Date  . Asthma 01-2004  . Back pain   . 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 HTN (hypertension)   . Joint pain   . Leg edema     PAST SURGICAL HISTORY: Past Surgical History:  Procedure Laterality Date  . ABDOMINAL HYSTERECTOMY  07-15-04   TYPE II RADICAL HYSTERECTOMY WITH BSO  . [place]  . MASS EXCISION Left 06/11/2015   Procedure: EXCISION LEFT ABDOMINAL WALL MASS 6X3CM;  Surgeon: Stark Klein, MD;  Location: [facility];  Service: General;  Laterality: Left;  . ORTHOPEDIC SURGERY     RIGHT SHOULDER AND ELBOW AND RIGHT FOOT    SOCIAL HISTORY: Social History   Tobacco Use  . Smoking status: Never Smoker  . Smokeless tobacco: Never Used  Substance Use Topics  . Alcohol use: No  . Drug use: No    FAMILY HISTORY: Family History  Problem Relation Age of Onset  . Heart disease Mother   . Heart disease Father     ROS: Review of Systems  Constitutional: Positive for malaise/fatigue and weight loss.  Cardiovascular: Negative for chest pain.  Gastrointestinal: Negative for nausea and vomiting.  Musculoskeletal:       Negative muscle weakness  Endo/Heme/Allergies:  Negative hypoglycemia    PHYSICAL EXAM: Blood pressure 109/68, pulse 90, temperature 97.7 F (36.5 C), temperature source Oral, height 5\' 5"  (1.651 m), weight 231 lb (104.8 kg), SpO2 99 %. Body mass index is 38.44 kg/m. Physical Exam Vitals signs reviewed.  Constitutional:      Appearance: Normal appearance. She is obese.  Cardiovascular:     Rate and Rhythm: Normal rate.     Pulses: Normal pulses.  Pulmonary:     Effort: Pulmonary effort is normal.     Breath sounds: Normal breath sounds.  Musculoskeletal: Normal range of motion.  Skin:    General: Skin is warm and dry.  Neurological:     Mental Status: She is alert and oriented to person, place, and time.  Psychiatric:        Mood and Affect: Mood normal.        Behavior: Behavior normal.      RECENT LABS AND TESTS: BMET    Component Value Date/Time   NA 142 05/25/2019 1523   K 3.8 05/25/2019 1523   CL 100 05/25/2019 1523   CO2 25 05/25/2019 1523   GLUCOSE 104 (H) 05/25/2019 1523   GLUCOSE 92 01/15/2012 0908   BUN 20 05/25/2019 1523   CREATININE 0.80 05/25/2019 1523   CALCIUM 9.4 05/25/2019 1523   GFRNONAA 78 05/25/2019 1523   GFRAA 90 05/25/2019 1523   Lab Results  Component Value Date   HGBA1C 6.0 (H) 05/25/2019   HGBA1C 5.8 (H) 01/03/2019   HGBA1C 7.1 (H) 09/05/2018   Lab Results  Component Value Date   INSULIN 8.6 05/25/2019   INSULIN 13.3 01/03/2019   INSULIN 15.8 09/05/2018   CBC    Component Value Date/Time   WBC 9.0 09/05/2018 0000   WBC 6.5 01/15/2012 0908   RBC 4.78 09/05/2018 0000   RBC 4.65 01/15/2012 0908   HGB 13.5 09/05/2018 0000   HGB 13.6 01/15/2012 0908   HCT 41.6 09/05/2018 0000   HCT 40.9 01/15/2012 0908   PLT 236 01/15/2012 0908   MCV 87 09/05/2018 0000   MCV 87.8 01/15/2012 0908   MCH 28.2 09/05/2018 0000   MCH 29.3 01/15/2012 0908   MCHC 32.5 09/05/2018 0000   MCHC 33.3 01/15/2012 0908   RDW 12.8 09/05/2018 0000   RDW 13.0 01/15/2012 0908   LYMPHSABS 2.6 09/05/2018 0000   LYMPHSABS 1.9 01/15/2012 0908   MONOABS 0.4 01/15/2012 0908   EOSABS 0.1 09/05/2018 0000   BASOSABS 0.1 09/05/2018 0000   BASOSABS 0.0 01/15/2012 0908   Iron/TIBC/Ferritin/ %Sat No results found for: IRON, TIBC, FERRITIN, IRONPCTSAT Lipid Panel     Component Value Date/Time   CHOL 231 (H) 05/25/2019 1523   TRIG 174 (H) 05/25/2019 1523   HDL 44 05/25/2019 1523   CHOLHDL 4.0 01/18/2006 0904   VLDL 28 01/18/2006 0904   LDLCALC 155 (H) 05/25/2019 1523   Hepatic Function Panel     Component Value Date/Time   PROT 7.2 05/25/2019 1523   ALBUMIN 4.6 05/25/2019 1523   AST 16 05/25/2019 1523   ALT 11 05/25/2019 1523   ALKPHOS 113 05/25/2019 1523   BILITOT 0.5 05/25/2019 1523      Component Value Date/Time   TSH 1.920 09/05/2018 0000       OBESITY BEHAVIORAL INTERVENTION VISIT  Today's visit was # 20   Starting weight: 266 lbs Starting date: 09/05/2018 Today's weight : 231 lbs Today's date: 07/17/2019 Total lbs lost to date: 35    ASK: We discussed  the diagnosis of obesity with Sharee Pimple today and Lise agreed to give [place] permission to discuss obesity behavioral modification therapy today.  ASSESS: Rennae has the diagnosis of obesity and her BMI today is 38.44 Suri is in the action stage of change   ADVISE: Amelina was educated on the multiple health risks of obesity as well as the benefit of weight loss to improve her health. She was advised of the need for long term treatment and the importance of lifestyle modifications to improve her current health and to decrease her risk of future health problems.  AGREE: Multiple dietary modification options and treatment options were discussed and  Orva agreed to follow the recommendations documented in the above note.  ARRANGE: Chiaki was educated on the importance of frequent visits to treat obesity as outlined per CMS and USPSTF guidelines and agreed to schedule her next follow up appointment today.  I, Trixie Dredge, am acting as transcriptionist for Dennard Nip, MD  I have reviewed the above documentation for accuracy and completeness, and I agree with the above. -Dennard Nip, MD

## 2019-08-04 ENCOUNTER — Emergency Department (HOSPITAL_COMMUNITY)
Admission: EM | Admit: 2019-08-04 | Discharge: 2019-08-04 | Disposition: A | Discharge status: Home / Self Care | Payer: Managed Care, Other (non HMO) | Attending: Emergency Medicine | Admitting: Emergency Medicine

## 2019-08-04 ENCOUNTER — Emergency Department (HOSPITAL_COMMUNITY): Payer: Managed Care, Other (non HMO)

## 2019-08-04 DIAGNOSIS — R9431 Abnormal electrocardiogram [ECG] [EKG]: Secondary | ICD-10-CM | POA: Insufficient documentation

## 2019-08-04 DIAGNOSIS — Z7984 Long term (current) use of oral hypoglycemic drugs: Secondary | ICD-10-CM | POA: Insufficient documentation

## 2019-08-04 DIAGNOSIS — J45909 Unspecified asthma, uncomplicated: Secondary | ICD-10-CM | POA: Insufficient documentation

## 2019-08-04 DIAGNOSIS — I2510 Atherosclerotic heart disease of native coronary artery without angina pectoris: Secondary | ICD-10-CM

## 2019-08-04 DIAGNOSIS — E119 Type 2 diabetes mellitus without complications: Secondary | ICD-10-CM | POA: Diagnosis not present

## 2019-08-04 DIAGNOSIS — I10 Essential (primary) hypertension: Secondary | ICD-10-CM | POA: Diagnosis not present

## 2019-08-04 DIAGNOSIS — Z79899 Other long term (current) drug therapy: Secondary | ICD-10-CM | POA: Insufficient documentation

## 2019-08-04 DIAGNOSIS — Z8544 Personal history of malignant neoplasm of other female genital organs: Secondary | ICD-10-CM | POA: Diagnosis not present

## 2019-08-04 DIAGNOSIS — R079 Chest pain, unspecified: Secondary | ICD-10-CM | POA: Diagnosis not present

## 2019-08-04 DIAGNOSIS — R0789 Other chest pain: Secondary | ICD-10-CM | POA: Diagnosis present

## 2019-08-04 LAB — BASIC METABOLIC PANEL
Anion gap: 12 (ref 5–15)
BUN: 33 mg/dL — ABNORMAL HIGH (ref 8–23)
CO2: 26 mmol/L (ref 22–32)
Calcium: 9.2 mg/dL (ref 8.9–10.3)
Chloride: 100 mmol/L (ref 98–111)
Creatinine, Ser: 0.91 mg/dL (ref 0.44–1.00)
GFR calc Af Amer: >60 mL/min (ref >60)
GFR calc non Af Amer: >60 mL/min (ref >60)
Glucose, Bld: 93 mg/dL (ref 70–99)
Potassium: 3.4 mmol/L — ABNORMAL LOW (ref 3.5–5.1)
Sodium: 138 mmol/L (ref 135–145)

## 2019-08-04 LAB — CBC
HCT: 42.4 % (ref 36.0–46.0)
Hemoglobin: 14 g/dL (ref 12.0–15.0)
MCH: 29.2 pg (ref 26.0–34.0)
MCHC: 33 g/dL (ref 30.0–36.0)
MCV: 88.5 fL (ref 80.0–100.0)
Platelets: 320 10*3/uL (ref 150–400)
RBC: 4.79 MIL/uL (ref 3.87–5.11)
RDW: 11.9 % (ref 11.5–15.5)
WBC: 8.3 10*3/uL (ref 4.0–10.5)
nRBC: 0 % (ref 0.0–0.2)

## 2019-08-04 LAB — TROPONIN I (HIGH SENSITIVITY)
Troponin I (High Sensitivity): 5 ng/L (ref <18)
Troponin I (High Sensitivity): 4 ng/L (ref <18)

## 2019-08-04 IMAGING — CT CT HEART MORP W/ CTA COR W/ SCORE W/ CA W/CM &/OR W/O CM
2 of 8 series · 4 of 20 positions shown, 5 images · IV contrast (omnipaque)
Comparison: 03/24/2005.

Addendum:
HISTORY: Chest pain, acute, low/intermediate prob, ACS suspected

EXAM:
Cardiac/Coronary  CT
TECHNIQUE: The patient was scanned on a Siemens Force scanner.
PROTOCOL: A 120 kV prospective scan was triggered in the descending thoracic
aorta at 111 HU's. Axial non-contrast 3 mm slices were carried out
through the heart. The data set was analyzed on a dedicated work
station and scored using the Agatson method. Gantry rotation speed
was 250 msecs and collimation was .6 mm. Beta blockade and 0.8 mg of
sl NTG was given. The 3D data set was reconstructed in 5% intervals
of the 67-82 % of the R-R cycle. Diastolic phases were analyzed on a
dedicated work station using MPR, MIP and VRT modes. The patient
received 100mL OMNIPAQUE IOHEXOL 350 MG/ML SOLN of contrast.

[Series 9: ts syst sharp 40 % · axial · 0.39mm/px · z∈[+1019,+1058]mm · 2 of 296 slices shown]
[im 99/296  lung]
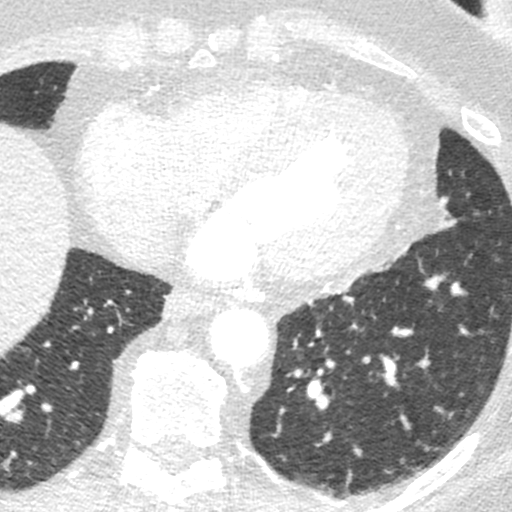
[im 197/296  lung]
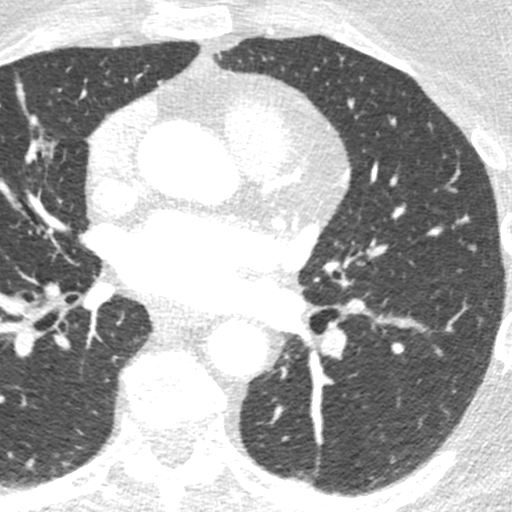

[Series 13: best syst · axial · 0.39mm/px · z∈[+1019,+1058]mm · 2 of 296 slices shown, 3 images]
[im 99/296  vessel]
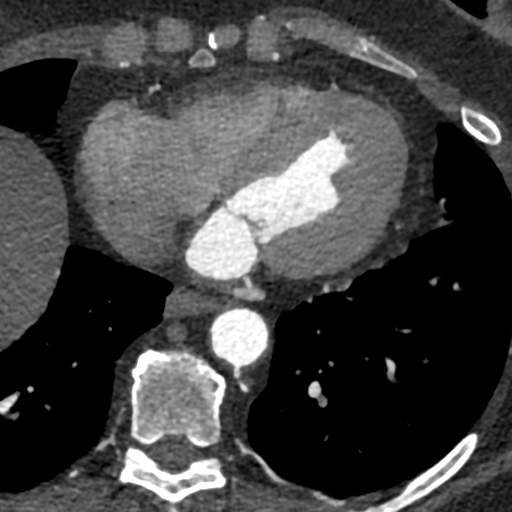
[im 99/296  lung]
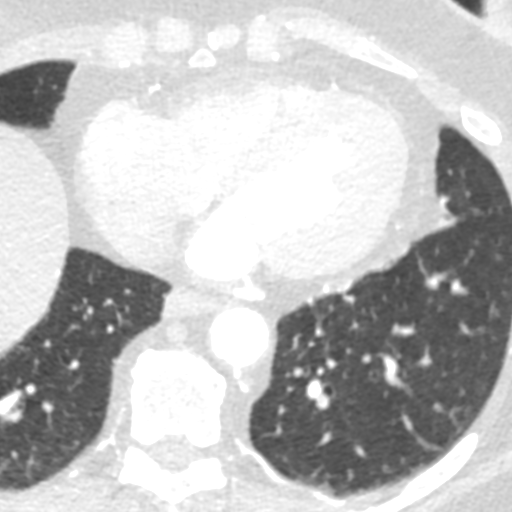
[im 197/296  vessel]
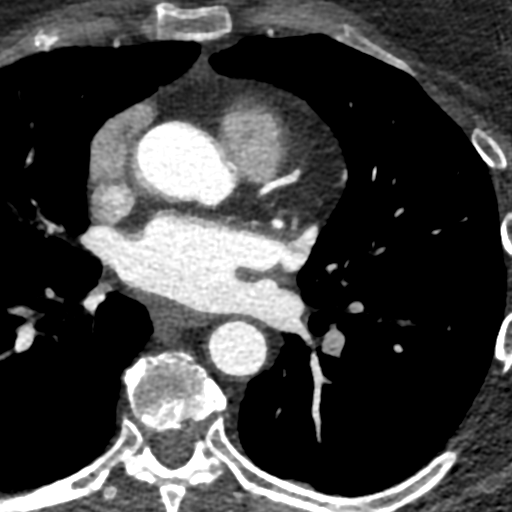

[4 of 20 positions shown; findings below may reference images not displayed]

FINDINGS: Coronary calcium score: The patient's coronary artery calcium score
is 0. Possible tiny calcifications in the RCA may be under the
resolving power of this study.

Coronary arteries: Normal coronary origins.  Co-dominance.

Right Coronary Artery: Mild ostial RCA atherosclerotic plaque,
25-49% stenosis.

Left Main Coronary Artery: No detectable plaque or stenosis.

Left Anterior Descending Coronary Artery: Minimal atherosclerotic
plaque in the proximal LAD.

Left Circumflex Artery: No detectable plaque or stenosis.

Aorta: Normal size, 32 mm at the mid ascending aorta (level of the
PA bifurcation) measured double oblique. Trivial calcifications. No
dissection.

Aortic Valve: No calcifications.

Other findings:

Normal pulmonary vein drainage into the left atrium.

Normal left atrial appendage without a thrombus.

Normal size of the pulmonary artery.

Trace pericardial fluid.
IMPRESSION: 1. Mild CAD, CADRADS = 2.

2. Coronary calcium score of 0. This was 0 percentile for age and
sex matched control.

3. Normal coronary origin with co-dominance.

EXAM:
OVER-READ INTERPRETATION  CT CHEST

The following report is an over-read performed by radiologist Dr.
Klever Jumper [[email or web address]] on 08/07/2019. This over-read
does not include interpretation of cardiac or coronary anatomy or
pathology. The coronary CTA interpretation by the cardiologist is
attached.
FINDINGS: The visualized portions of the lower lung fields show no suspicious
nodules, masses, or infiltrates. No pleural fluid seen.

The visualized portions of the mediastinum and chest wall are
unremarkable.
IMPRESSION: No significant non-cardiovascular abnormality seen in visualized
portion of the thorax.

*** End of Addendum ***
FINDINGS: Coronary calcium score: The patient's coronary artery calcium score
is 0. Possible tiny calcifications in the RCA may be under the
resolving power of this study.

Coronary arteries: Normal coronary origins.  Co-dominance.

Right Coronary Artery: Mild ostial RCA atherosclerotic plaque,
25-49% stenosis.

Left Main Coronary Artery: No detectable plaque or stenosis.

Left Anterior Descending Coronary Artery: Minimal atherosclerotic
plaque in the proximal LAD.

Left Circumflex Artery: No detectable plaque or stenosis.

Aorta: Normal size, 32 mm at the mid ascending aorta (level of the
PA bifurcation) measured double oblique. Trivial calcifications. No
dissection.

Aortic Valve: No calcifications.

Other findings:

Normal pulmonary vein drainage into the left atrium.

Normal left atrial appendage without a thrombus.

Normal size of the pulmonary artery.

Trace pericardial fluid.
IMPRESSION: 1. Mild CAD, CADRADS = 2.

2. Coronary calcium score of 0. This was 0 percentile for age and
sex matched control.

3. Normal coronary origin with co-dominance.

## 2019-08-04 MED ORDER — NITROGLYCERIN 0.4 MG SL SUBL
0.8000 mg | SUBLINGUAL_TABLET | Freq: Once | SUBLINGUAL | Status: AC
  Administered 2019-08-04: 16:00:00 0.8 mg via SUBLINGUAL

## 2019-08-04 MED ORDER — ROSUVASTATIN CALCIUM 20 MG PO TABS: 20.0000 mg | ORAL_TABLET | Freq: Every day | ORAL | 0 refills | Status: DC

## 2019-08-04 MED ORDER — NITROGLYCERIN 0.4 MG SL SUBL
SUBLINGUAL_TABLET | SUBLINGUAL | Status: AC
  Administered 2019-08-04: 0.8 mg via SUBLINGUAL
  Filled 2019-08-04: qty 2

## 2019-08-04 MED ORDER — SODIUM CHLORIDE 0.9% FLUSH: 3.0000 mL | Freq: Once | INTRAVENOUS | Status: DC

## 2019-08-04 MED ORDER — IOHEXOL 350 MG/ML SOLN
100.0000 mL | Freq: Once | INTRAVENOUS | Status: AC | PRN
  Administered 2019-08-04: 16:00:00 100 mL via INTRAVENOUS

## 2019-08-04 NOTE — ED Provider Notes (Signed)
[facility] EMERGENCY DEPARTMENT Provider Note   CSN: [id number] Arrival date & time: 08/04/19  0941     History   Chief Complaint No chief complaint on file.   HPI Gail Crawford is a 63 y.o. female with h/o HTN, diabetes on metformin presents to ER from PCP office for evaluation of chest pain. Per triage note and patient PCP sent here due to abnormal EKG.  Patient states PCP called cardiologist who told her they would refer patient to ER as well for evaluation. Reports chest pain for several weeks usually when active and at work.  Has been attributing her chest pain due to stress because she has had to work more lately.  Mostly noticed chest pain at work and while active, and sometimes if she is laying down or if she breaths too deeply. It was significantly worst yesterday morning while at work.  She works in [employer]. She was standing prepping lunch when she felt the pain.  Pain is "sharp" and "tight" like a sore muscle. Located in epigastric, subxiphoid area between breast.  She had associated nausea, cold sweats, light headedness and mild shortness of breath with it yesterday which is new.  Usually the last few weeks CP will cause left shoulder pain to ache but also has history of chronic left neck pain that she is going to chiropractor for to get adjusted.  She has noticed increased belching as well but denies abdominal pain, post prandial chest or abdominal pain, regurgitation or issues with acid reflux.  Yesterday the pain lasted all throughout work and eventually resolved when she stopped working.    No interventions. Denies associated fever, cough.  No syncope, leg swelling, calf pain, orthopnea, PND. No personal h/o CAD.  Father died of MI at age 77, mother died of MI at age 98.  Brother has pacemaker.  No h/o DVT/PE recent immobilization, active cancer, surgery.  Takes diclofenac daily for knee pain.     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   Peripheral neuropathy 01/15/2012   Obesity 01/15/2012    Past Surgical History:  Procedure Laterality Date   ABDOMINAL HYSTERECTOMY  07-15-04   TYPE II RADICAL HYSTERECTOMY WITH BSO   CESAREAN SECTION  1978   MASS EXCISION Left 06/11/2015   Procedure: EXCISION LEFT ABDOMINAL WALL MASS 6X3CM;  Surgeon: Stark Klein, MD;  Location: [facility];  Service: General;  Laterality: Left;   ORTHOPEDIC SURGERY     RIGHT SHOULDER AND ELBOW AND RIGHT FOOT     OB History    Gravida  2   Para      Term      Preterm      AB      Living  2     SAB      TAB      Ectopic      Multiple      Live Births  Home Medications    Prior to Admission medications   Medication Sig Start Date End Date Taking? Authorizing Provider  chlorthalidone (HYGROTON) 25 MG tablet Take 1 tablet (25 mg total) by mouth daily. 07/17/19   Dennard Nip D, MD  diclofenac (VOLTAREN) 75 MG EC tablet Take 75 mg by mouth 2 (two) times daily.    [provider]  fexofenadine (ALLEGRA) 180 MG tablet Take 180 mg by mouth daily.    [provider]  losartan (COZAAR) 50 MG tablet Take 1 tablet (50 mg total) by mouth daily. 07/17/19   Dennard Nip D, MD  metFORMIN (GLUCOPHAGE) 500 MG tablet Take 1 tablet (500 mg total) by mouth 2 (two) times daily with a meal. 07/17/19   Leafy Ro, Caren D, MD  Vitamin D, Ergocalciferol, (DRISDOL) 1.25 MG (50000 UT) CAPS capsule Take 1 capsule (50,000 Units total) by mouth every  7 (seven) days. 07/17/19   Dennard Nip D, MD    Family History Family History  Problem Relation Age of Onset   Heart disease Mother    Heart disease Father     Social History Social History   Tobacco Use   Smoking status: Never Smoker   Smokeless tobacco: Never Used  Substance Use Topics   Alcohol use: No   Drug use: No     Allergies   Lisinopril and Sulfa antibiotics   Review of Systems Review of Systems  Constitutional: Positive for diaphoresis.  Respiratory: Positive for shortness of breath.   Cardiovascular: Positive for chest pain.  Gastrointestinal: Positive for nausea.  Musculoskeletal: Positive for arthralgias.  Neurological: Positive for light-headedness.  All other systems reviewed and are negative.  Physical Exam Updated Vital Signs BP (!) 143/76 (BP Location: Right Arm)    Pulse (!) 56    Temp 98.4 F (36.9 C) (Oral)    Resp 13    SpO2 96%   Physical Exam Constitutional:      Appearance: She is well-developed.     Comments: NAD. Non toxic.   HENT:     Head: Normocephalic and atraumatic.     Nose: Nose normal.  Eyes:     General: Lids are normal.     Conjunctiva/sclera: Conjunctivae normal.  Neck:     Musculoskeletal: Normal range of motion.     Trachea: Trachea normal.     Comments: Trachea midline.  Cardiovascular:     Rate and Rhythm: Normal rate and regular rhythm.     Pulses:          Radial pulses are 1+ on the right side and 1+ on the left side.       Dorsalis pedis pulses are 1+ on the right side and 1+ on the left side.     Heart sounds: Normal heart sounds, S1 normal and S2 normal.     Comments: No LE edema or calf tenderness. No chest wall tenderness. Skin normal over chest/abdomen  Pulmonary:     Effort: Pulmonary effort is normal.     Breath sounds: Normal breath sounds.  Abdominal:     General: Bowel sounds are normal.     Palpations: Abdomen is soft.     Tenderness: There is no abdominal tenderness.     Comments:  No epigastric tenderness. No distention.   Skin:    General: Skin is warm and dry.     Capillary Refill: Capillary refill takes less than 2 seconds.     Comments: No rash to chest wall  Neurological:  Mental Status: She is alert.     GCS: GCS eye subscore is 4. GCS verbal subscore is 5. GCS motor subscore is 6.     Comments:  Sensation and strength intact in upper/lower extremities  Psychiatric:        Speech: Speech normal.        Behavior: Behavior normal.        Thought Content: Thought content normal.      ED Treatments / Results  Labs (all labs ordered are listed, but only abnormal results are displayed) Labs Reviewed  BASIC METABOLIC PANEL - Abnormal; Notable for the following components:      Result Value   Potassium 3.4 (*)    BUN 33 (*)    All other components within normal limits  CBC  TROPONIN I (HIGH SENSITIVITY)  TROPONIN I (HIGH SENSITIVITY)    EKG EKG Interpretation  Date/Time:  Friday August 04 2019 11:14:28 EST Ventricular Rate:  64 PR Interval:    QRS Duration: 100 QT Interval:  445 QTC Calculation: 460 R Axis:   -29 Text Interpretation: Sinus rhythm Low voltage, precordial leads Left ventricular hypertrophy No acute changes No significant change since last tracing Confirmed by Varney Biles [id number]) on 08/04/2019 11:17:28 AM   Radiology Dg Chest 2 View  Result Date: 08/04/2019 CLINICAL DATA:  Chest pain. EXAM: CHEST - 2 VIEW COMPARISON:  CT scan of the chest dated 03/24/2005 FINDINGS: The heart size and mediastinal contours are within normal limits. Both lungs are clear except for slight thickening of the right minor fissure which is chronic. Chronic minimal blunting of the right costophrenic angle. The visualized skeletal structures are unremarkable. Aortic atherosclerosis. IMPRESSION: No active cardiopulmonary disease. Aortic Atherosclerosis (ICD10-I70.0). Electronically Signed   By: Lorriane Shire M.D.   On: 08/04/2019 10:43     Procedures Procedures (including critical care time)  Medications Ordered in ED Medications  sodium chloride flush (NS) 0.9 % injection 3 mL (has no administration in time range)     Initial Impression / Assessment and Plan / ED Course  I have reviewed the triage vital signs and the nursing notes.  Pertinent labs & imaging results that were available during my care of the patient were reviewed by me and considered in my medical decision making (see chart for details).  EMR reviewed.   EKG today similar to EKG 08/2018.  Unable to see EKG form PCP office today.  Patient reports she was sent to ER for abnormal EKG after PCP called cardiologist who referred to ER.   Patient reports symptoms concerning for unstable angina yesterday including chest pain while at work/activity, SOB, nausea, diaphoresis, light headedness, pressure. Has had left shoulder pain.  However, she also reports some atypical components to CP for the last several weeks pain with breathing, moving her arms, sometimes while laying down, "sharp", belching.  She takes diclofenac. ? MSK, GI etiology considered.   Given PCP and cardiology concern, family h/o CAD, and more typical history yesterday cardiology was consulted.  Spoke to Angie who will see patient in ER. EDP recommending CT coronary study.  HEART score 3 based on symptoms, HTN, BMI.   1524: Cardiology has seen patient, per their note they recommend CT coronary here.  If non obstructive appropriate for dc with crestor rx.  Handed off to oncoming EDP Dr Rex Kras.  Final Clinical Impressions(s) / ED Diagnoses   Final diagnoses:  Chest pain  Chest pain  Chest pain    ED Discharge Orders  None       Kinnie Feil, PA-C 08/04/19 Lanier, Ankit, MD 08/05/19 [phone]

## 2019-08-04 NOTE — Consult Note (Addendum)
Cardiology Consultation:   Patient ID: Gail Crawford MRN: [id number]; DOB: [birth year]  Admit date: 08/04/2019 Date of Consult: 08/04/2019  Primary Care Provider: Chesley Noon, MD Primary Cardiologist: new - Dr. Margaretann Loveless Primary Electrophysiologist:  None    Patient Profile:   Gail Crawford is a 63 y.o. female with a hx of HTN, leg edema, and asthma who is being seen today for the evaluation of chest pain at the request of Dr. Kathrynn Humble.   History of Present Illness:   Gail Crawford does not currently follow with cardiology. She has hypertension controlled with losartan and hygroton. She is seeing weight management with Dr. Leafy Ro, who diagnosed her hypertension. Dr. Leafy Ro also ordered an echocardiogram for cardiomegaly on CXR. Echo showed normal EF, grade 1 DD, no valvular disease.  She works in [employer]. She states that several weeks ago she started having chest pain with exertion.Chest pain is described as sharp, across her chest under both breasts, and can wrap around under her left arm. Chest pain resolves with rest. Chest pain is worse with deep inspiration and sometimes when moving her upper arms. Yesterday, chest pain was much worse, rated as a 10/10, and associated with shortness of breath, nausea, and diaphoresis. All symptoms resolved when she got home and rested. She does not smoke. Last LDL was 101. She has a strong family history of CAD - both parents died of heart attacks (father at age 72 and mother in her 63s). She had a recurrence of chest pain this morning at her PCP office because she was anxious.   In the ER, hs troponin x 2 negative. EKG nonischemic.  CBC unremarkable Hypokalemic K 3.4   Heart Pathway Score:     Past Medical History:  Diagnosis Date  . Asthma 01-2004  . Back pain   . 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 HTN (hypertension)   . Joint pain   . Leg edema     Past Surgical History:   Procedure Laterality Date  . ABDOMINAL HYSTERECTOMY  07-15-04   TYPE II RADICAL HYSTERECTOMY WITH BSO  . [place]  . MASS EXCISION Left 06/11/2015   Procedure: EXCISION LEFT ABDOMINAL WALL MASS 6X3CM;  Surgeon: Stark Klein, MD;  Location: [facility];  Service: General;  Laterality: Left;  . ORTHOPEDIC SURGERY     RIGHT SHOULDER AND ELBOW AND RIGHT FOOT     Home Medications:  Prior to Admission medications   Medication Sig Start Date End Date Taking? Authorizing Provider  chlorthalidone (HYGROTON) 25 MG tablet Take 1 tablet (25 mg total) by mouth daily. 07/17/19   Dennard Nip D, MD  diclofenac (VOLTAREN) 75 MG EC tablet Take 75 mg by mouth 2 (two) times daily.    [provider]  fexofenadine (ALLEGRA) 180 MG tablet Take 180 mg by mouth daily.    [provider]  losartan (COZAAR) 50 MG tablet Take 1 tablet (50 mg total) by mouth daily. 07/17/19   Dennard Nip D, MD  metFORMIN (GLUCOPHAGE) 500 MG tablet Take 1 tablet (500 mg total) by mouth 2 (two) times daily with a meal. 07/17/19   Leafy Ro, Caren D, MD  Vitamin D, Ergocalciferol, (DRISDOL) 1.25 MG (50000 UT) CAPS capsule Take 1 capsule (50,000 Units total) by mouth every 7 (seven) days. 07/17/19   Dennard Nip D, MD    Inpatient Medications: Scheduled Meds: . sodium chloride flush  3 mL Intravenous Once  Continuous Infusions:  PRN Meds:   Allergies:    Allergies  Allergen Reactions  . Lisinopril Cough  . Sulfa Antibiotics Nausea And Vomiting    Social History:   Social History   Socioeconomic History  . Marital status: Married    Spouse name: Programme researcher, broadcasting/film/video  . Number of children: Not on file  . Years of education: Not on file  . Highest education level: Not on file  Occupational History  . Occupation: Investment banker, operational  . Financial resource strain: Not on file  . Food insecurity    Worry: Not on file    Inability: Not on file  . Transportation needs     Medical: Not on file    Non-medical: Not on file  Tobacco Use  . Smoking status: Never Smoker  . Smokeless tobacco: Never Used  Substance and Sexual Activity  . Alcohol use: No  . Drug use: No  . Sexual activity: Not on file  Lifestyle  . Physical activity    Days per week: Not on file    Minutes per session: Not on file  . Stress: Not on file  Relationships  . Social Herbalist on phone: Not on file    Gets together: Not on file    Attends religious service: Not on file    Active member of club or organization: Not on file    Attends meetings of clubs or organizations: Not on file    Relationship status: Not on file  . Intimate partner violence    Fear of current or ex partner: Not on file    Emotionally abused: Not on file    Physically abused: Not on file    Forced sexual activity: Not on file  Other Topics Concern  . Not on file  Social History Narrative  . Not on file    Family History:    Family History  Problem Relation Age of Onset  . Heart disease Mother   . Heart disease Father      ROS:  Please see the history of present illness.   All other ROS reviewed and negative.     Physical Exam/Data:   Vitals:   08/04/19 1130 08/04/19 1200 08/04/19 1215 08/04/19 1230  BP:      Pulse: (!) 59 (!) 59 (!) 58 (!) 56  Resp: 18 10 16 13   Temp:      TempSrc:      SpO2: 100% 96% 99% 96%   No intake or output data in the 24 hours ending 08/04/19 1447 Last 3 Weights 07/17/2019 06/26/2019 06/08/2019  Weight (lbs) 231 lb 234 lb 231 lb  Weight (kg) 104.781 kg 106.142 kg 104.781 kg     There is no height or weight on file to calculate BMI.  General:  Obese female, in no acute distress HEENT: normal Neck: no JVD Vascular: No carotid bruits Cardiac:  normal S1, S2; RRR; no murmur  Lungs:  clear to auscultation bilaterally, no wheezing, rhonchi or rales  Abd: soft, nontender, no hepatomegaly  Ext: no edema Musculoskeletal:  No deformities, BUE and BLE  strength normal and equal Skin: warm and dry  Neuro:  CNs 2-12 intact, no focal abnormalities noted Psych:  Normal affect   EKG:  The EKG was personally reviewed and demonstrates:  Sinus rhythm HR 64 Telemetry:  Telemetry was personally reviewed and demonstrates:  Sinus rhythm 50-60s  Relevant CV Studies:  Echo 09/12/18: Study Conclusions - Left ventricle:  The cavity size was normal. Wall thickness was   normal. Systolic function was normal. The estimated ejection   fraction was in the range of 60% to 65%. Wall motion was normal;   there were no regional wall motion abnormalities. Doppler   parameters are consistent with abnormal left ventricular   relaxation (grade 1 diastolic dysfunction).   Laboratory Data:  High Sensitivity Troponin:   Recent Labs  Lab 08/04/19 1016 08/04/19 1316  TROPONINIHS 5 4     Chemistry Recent Labs  Lab 08/04/19 1016  NA 138  K 3.4*  CL 100  CO2 26  GLUCOSE 93  BUN 33*  CREATININE 0.91  CALCIUM 9.2  GFRNONAA >60  GFRAA >60  ANIONGAP 12    No results for input(s): PROT, ALBUMIN, AST, ALT, ALKPHOS, BILITOT in the last 168 hours. Hematology Recent Labs  Lab 08/04/19 1016  WBC 8.3  RBC 4.79  HGB 14.0  HCT 42.4  MCV 88.5  MCH 29.2  MCHC 33.0  RDW 11.9  PLT 320   BNPNo results for input(s): BNP, PROBNP in the last 168 hours.  DDimer No results for input(s): DDIMER in the last 168 hours.   Radiology/Studies:  Dg Chest 2 View  Result Date: 08/04/2019 CLINICAL DATA:  Chest pain. EXAM: CHEST - 2 VIEW COMPARISON:  CT scan of the chest dated 03/24/2005 FINDINGS: The heart size and mediastinal contours are within normal limits. Both lungs are clear except for slight thickening of the right minor fissure which is chronic. Chronic minimal blunting of the right costophrenic angle. The visualized skeletal structures are unremarkable. Aortic atherosclerosis. IMPRESSION: No active cardiopulmonary disease. Aortic Atherosclerosis  (ICD10-I70.0). Electronically Signed   By: Lorriane Shire M.D.   On: 08/04/2019 10:43    Assessment and Plan:   1. Chest pain - pt reports both typical and atypical symptoms concerning for angina - hs troponin x 2 negative - EKG nonischemic - risk factors include obesity, HTN, mild HLD,  - she also has a strong family history of CAD - both parents died of Mis - will order CT coronary to be completed ASAP - if nonobstructive disease, can discharge from the ER, will make cardiology follow up   2. Hypertension - continue losartan and hygroton   3. Mild hyperlipidemia - given her history and risk factors, would aim for LDL less than 70 - start 20 mg crestor   4. Obesity - she is working with weight management       For questions or updates, please contact [facility] Please consult [email or web address] for contact info under     Signed, Ledora Bottcher, PA  08/04/2019 2:47 PM  ---------------------------------------------------------------------------------------------   History and all data above reviewed.  Unfortunately the patient was discharged from the ED prior to my evaluation. I had attempted to see the patient in consultation however she was out of the room at radiology. When I returned to see the patient I was informed she had already been discharged. I was unable to assess and examine the patient independently.  Gail Crawford is a 63 year old female with hypertension hyperlipidemia and obesity who presented today with 10 out of 10 chest pain. Her troponins were unremarkable and ECG was negative for ischemia. Functional imaging was recommended. Given her adequate heart rate CT coronary angiogram was pursued.  All available labs, radiology testing, previous records reviewed.   Plan: I have reviewed the patient's CT coronary angiogram which demonstrated mild ostial RCA disease. I have sent the study  for CT FFR to ensure there is no hemodynamic significance of  the RCA ostial lesion. However it is reassuring that there is no severe obstructive coronary artery disease demonstrated on CTA. There was motion artifact on this exam, however it did not significantly degrade the diagnostic quality of the study. She also has a trace pericardial effusion, and in the absence of obstructive coronary artery disease I would have liked to discuss with her any symptoms of pericarditis.  I will be in touch with the patient to offer follow-up in my office and to further evaluate her symptoms.  Elouise Munroe, MD [facility] 6:14 PM  08/04/2019

## 2019-08-04 NOTE — Discharge Instructions (Addendum)
Your coronary CT scan showed very mild build up of plaque in your coronary arteries. The cardiology team has recommended starting a cholesterol medication (Crestor) in addition to your current medications to reduce your risk of heart disease. Please follow up with your primary care provider. Return to ER if any sudden changes in symptoms, sudden worsening of symptoms, or new symptoms such as breathing problems, passing out, or vomiting.

## 2019-08-04 NOTE — ED Triage Notes (Signed)
Pt sent here by md for an abnormal EKG and n/v and dizziness and chest pain that occurred yesterday , pt mom and dad both have hearty disease

## 2019-08-04 NOTE — ED Notes (Signed)
Patient Alert and oriented to baseline. Stable and ambulatory to baseline. Patient verbalized understanding of the discharge instructions.  Patient belongings were taken by the patient.   

## 2019-08-04 NOTE — ED Provider Notes (Signed)
I received this patient in signout from [place].  At time of signout, patient had presented with chest pain and after discussion with cardiology service, they had recommended coronary CT to evaluate for CAD.   CT showed mild CAD, no significant stenosis or large plaques. Per cardiology recommendations, started patient on Crestor and discussed importance of PCP f/u for reassessment and risk reduction. Extensively reviewed return precautions.  After discharge, I was contacted by Dr. Margaretann Loveless who was supervising cardiologist but wasn't able to see patient prior to discharge. Per her note, pt will be contacted to offer f/u in cardiology clinic.    Gail Crawford, Wenda Overland, MD 08/04/19 2147

## 2019-08-07 ENCOUNTER — Other Ambulatory Visit: Payer: Self-pay

## 2019-08-07 ENCOUNTER — Encounter (INDEPENDENT_AMBULATORY_CARE_PROVIDER_SITE_OTHER): Payer: Self-pay | Admitting: Family Medicine

## 2019-08-07 ENCOUNTER — Ambulatory Visit (INDEPENDENT_AMBULATORY_CARE_PROVIDER_SITE_OTHER): Payer: Managed Care, Other (non HMO) | Admitting: Family Medicine

## 2019-08-07 VITALS — BP 116/71 | HR 67 | Temp 98.1°F | Ht 65.0 in | Wt 229.0 lb

## 2019-08-07 DIAGNOSIS — I2510 Atherosclerotic heart disease of native coronary artery without angina pectoris: Secondary | ICD-10-CM | POA: Diagnosis not present

## 2019-08-07 DIAGNOSIS — R1013 Epigastric pain: Secondary | ICD-10-CM | POA: Diagnosis not present

## 2019-08-07 DIAGNOSIS — Z6838 Body mass index (BMI) 38.0-38.9, adult: Secondary | ICD-10-CM

## 2019-08-07 NOTE — Progress Notes (Signed)
Office: [phone]  /  Fax: [phone]   HPI:   Chief Complaint: OBESITY Gail Crawford is here to discuss her progress with her obesity treatment plan. She is on the Category 2 plan and is following her eating plan approximately 75% of the time. She states she is exercising 0 minutes 0 times per week. Gail Crawford has done well with weight loss even over Thanksgiving. Hunger is controlled and she has done well with PC/Girard. She has already gotten back on track. Her weight is 229 lb (103.9 kg) today and has had a weight loss of 2 pounds over a period of 3 weeks since her last visit. She has lost 37 lbs since starting treatment with [place].  Gail Crawford was seen in the Emergency Department for Gail pain and was ruled out for MI. She notes significant belching and pain only when she is at work with increased anxiety.  ASSESSMENT AND PLAN:  Gail pain  Class 2 severe obesity with serious comorbidity and body mass index (BMI) of 38.0 to 38.9 in adult, unspecified obesity type ([place])  PLAN:  Gail Pain Gail Crawford will start Nexium OTC 20 mg daily and will follow-up with her PCP in 2 days.  I spent > than 50% of the 21 minute visit on counseling as documented in the note.   Obesity Gail Crawford is currently in the action stage of change. As such, her goal is to continue with weight loss efforts. She has agreed to follow the Category 2 plan. Gail Crawford has been instructed to work up to a goal of 150 minutes of combined cardio and strengthening exercise per week for weight loss and overall health benefits. We discussed the following Behavioral Modification Strategies today: work on meal planning and easy cooking plans, and holiday eating strategies.  Gail Crawford has agreed to follow-up with our clinic in 2 weeks. She was informed of the importance of frequent follow-up visits to maximize her success with intensive lifestyle modifications for her multiple health  conditions.  ALLERGIES: Allergies  Allergen Reactions   Lisinopril Cough   Sulfa Antibiotics Nausea And Vomiting    MEDICATIONS: Current Outpatient Medications on File Prior to Visit  Medication Sig Dispense Refill   chlorthalidone (HYGROTON) 25 MG tablet Take 1 tablet (25 mg total) by mouth daily. 30 tablet 0   diclofenac (VOLTAREN) 75 MG EC tablet Take 75 mg by mouth 2 (two) times daily.     fexofenadine (ALLEGRA) 180 MG tablet Take 180 mg by mouth daily.     losartan (COZAAR) 50 MG tablet Take 1 tablet (50 mg total) by mouth daily. 30 tablet 0   metFORMIN (GLUCOPHAGE) 500 MG tablet Take 1 tablet (500 mg total) by mouth 2 (two) times daily with a meal. 60 tablet 0   rosuvastatin (CRESTOR) 20 MG tablet Take 1 tablet (20 mg total) by mouth daily. 30 tablet 0   Vitamin D, Ergocalciferol, (DRISDOL) 1.25 MG (50000 UT) CAPS capsule Take 1 capsule (50,000 Units total) by mouth every 7 (seven) days. 4 capsule 0   No current facility-administered medications on file prior to visit.     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   HTN (hypertension)    Joint pain    Leg edema     PAST SURGICAL HISTORY: Past Surgical History:  Procedure Laterality Date   ABDOMINAL HYSTERECTOMY  07-15-04   TYPE II RADICAL HYSTERECTOMY  WITH BSO   CESAREAN SECTION  1978   MASS EXCISION Left 06/11/2015   Procedure: EXCISION LEFT ABDOMINAL WALL MASS 6X3CM;  Surgeon: Stark Klein, MD;  Location: [facility];  Service: General;  Laterality: Left;   ORTHOPEDIC SURGERY     RIGHT SHOULDER AND ELBOW AND RIGHT FOOT    SOCIAL HISTORY: Social History   Tobacco Use   Smoking status: Never Smoker   Smokeless tobacco: Never Used  Substance Use Topics   Alcohol use: No   Drug use: No    FAMILY HISTORY: Family History  Problem Relation Age of Onset   Heart disease  Mother    Heart disease Father    ROS: Review of Systems  Gastrointestinal:       Positive for Gail pain.   PHYSICAL EXAM: Blood pressure 116/71, pulse 67, temperature 98.1 F (36.7 C), temperature source Oral, height 5\' 5"  (1.651 m), weight 229 lb (103.9 kg), SpO2 99 %. Body mass index is 38.11 kg/m. Physical Exam Vitals signs reviewed.  Constitutional:      Appearance: Normal appearance. She is obese.  Cardiovascular:     Rate and Rhythm: Normal rate.     Pulses: Normal pulses.  Pulmonary:     Effort: Pulmonary effort is normal.     Breath sounds: Normal breath sounds.  Musculoskeletal: Normal range of motion.  Skin:    General: Skin is warm and dry.  Neurological:     Mental Status: She is alert and oriented to person, place, and time.  Psychiatric:        Behavior: Behavior normal.   RECENT LABS AND TESTS: BMET    Component Value Date/Time   NA 138 08/04/2019 1016   NA 142 05/25/2019 1523   K 3.4 (L) 08/04/2019 1016   CL 100 08/04/2019 1016   CO2 26 08/04/2019 1016   GLUCOSE 93 08/04/2019 1016   BUN 33 (H) 08/04/2019 1016   BUN 20 05/25/2019 1523   CREATININE 0.91 08/04/2019 1016   CALCIUM 9.2 08/04/2019 1016   GFRNONAA >60 08/04/2019 1016   GFRAA >60 08/04/2019 1016   Lab Results  Component Value Date   HGBA1C 6.0 (H) 05/25/2019   HGBA1C 5.8 (H) 01/03/2019   HGBA1C 7.1 (H) 09/05/2018   Lab Results  Component Value Date   INSULIN 8.6 05/25/2019   INSULIN 13.3 01/03/2019   INSULIN 15.8 09/05/2018   CBC    Component Value Date/Time   WBC 8.3 08/04/2019 1016   RBC 4.79 08/04/2019 1016   HGB 14.0 08/04/2019 1016   HGB 13.5 09/05/2018 0000   HGB 13.6 01/15/2012 0908   HCT 42.4 08/04/2019 1016   HCT 41.6 09/05/2018 0000   HCT 40.9 01/15/2012 0908   PLT 320 08/04/2019 1016   PLT 236 01/15/2012 0908   MCV 88.5 08/04/2019 1016   MCV 87 09/05/2018 0000   MCV 87.8 01/15/2012 0908   MCH 29.2 08/04/2019 1016   MCHC 33.0 08/04/2019 1016   RDW  11.9 08/04/2019 1016   RDW 12.8 09/05/2018 0000   RDW 13.0 01/15/2012 0908   LYMPHSABS 2.6 09/05/2018 0000   LYMPHSABS 1.9 01/15/2012 0908   MONOABS 0.4 01/15/2012 0908   EOSABS 0.1 09/05/2018 0000   BASOSABS 0.1 09/05/2018 0000   BASOSABS 0.0 01/15/2012 0908   Iron/TIBC/Ferritin/ %Sat No results found for: IRON, TIBC, FERRITIN, IRONPCTSAT Lipid Panel     Component Value Date/Time   CHOL 231 (H) 05/25/2019 1523   TRIG 174 (H) 05/25/2019 1523  HDL 44 05/25/2019 1523   CHOLHDL 4.0 01/18/2006 0904   VLDL 28 01/18/2006 0904   LDLCALC 155 (H) 05/25/2019 1523   Hepatic Function Panel     Component Value Date/Time   PROT 7.2 05/25/2019 1523   ALBUMIN 4.6 05/25/2019 1523   AST 16 05/25/2019 1523   ALT 11 05/25/2019 1523   ALKPHOS 113 05/25/2019 1523   BILITOT 0.5 05/25/2019 1523      Component Value Date/Time   TSH 1.920 09/05/2018 0000   Results for SABENA, GISMONDI (MRN [id number]) as of 08/07/2019 16:51  Ref. Range 05/25/2019 15:23  Vitamin D, 25-Hydroxy Latest Ref Range: 30.0 - 100.0 ng/mL 41.2   OBESITY BEHAVIORAL INTERVENTION VISIT  Today's visit was #21  Starting weight: 266 lbs Starting date: 09/05/2018 Today's weight: 229 lbs  Today's date: 08/07/2019 Total lbs lost to date: 37     08/07/2019  Height 5\' 5"  (1.651 m)  Weight 229 lb (103.9 kg)  BMI (Calculated) 38.11  BLOOD PRESSURE - SYSTOLIC 99991111  BLOOD PRESSURE - DIASTOLIC 71   Body Fat % 123XX123 %  Total Body Water (lbs) 79.2 lbs   ASK: We discussed the diagnosis of obesity with Sharee Pimple today and Jaliya agreed to give [place] permission to discuss obesity behavioral modification therapy today.  ASSESS: Kimiyo has the diagnosis of obesity and her BMI today is 38.1. Tareva is in the action stage of change.   ADVISE: Cilla was educated on the multiple health risks of obesity as well as the benefit of weight loss to improve her health. She was advised of the need for long term  treatment and the importance of lifestyle modifications to improve her current health and to decrease her risk of future health problems.  AGREE: Multiple dietary modification options and treatment options were discussed and  Gazelle agreed to follow the recommendations documented in the above note.  ARRANGE: Lysha was educated on the importance of frequent visits to treat obesity as outlined per CMS and USPSTF guidelines and agreed to schedule her next follow up appointment today.  IMichaelene Song, am acting as Location manager for Dennard Nip, MD

## 2019-08-11 ENCOUNTER — Telehealth: Payer: Self-pay | Admitting: Internal Medicine

## 2019-08-11 DIAGNOSIS — R072 Precordial pain: Secondary | ICD-10-CM

## 2019-08-11 NOTE — Telephone Encounter (Signed)
I called Gail Crawford this morning to follow-up on her symptoms after her emergency department visit for chest pain.  We spoke for approximately 15 minutes.  She states that she feels well at this time and has not had an additional episode of chest pain.  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  She was concerned about her symptoms of left-sided chest pain and presented to the emergency department.  Her ECG on presentation to the ED showed sinus rhythm with LVH and no significant change from the previous tracing.  No definite signs of ischemia.  CT coronary angiogram was obtained to rule out obstructive CAD, and showed mild CAD.  CT FFR demonstrated hemodynamically significant flow limitation in the small caliber codominant PDA.  A strategy of medical management is recommended.  Given that the patient was dismissed from the emergency department prior to my ability to evaluate her in person and schedule follow-up, I contacted the patient today to discuss options going forward.  I have offered her an appointment in our office during the first week of January at my earliest availability, or an appointment with one of my colleagues before the end of the year.  She would like to make an appointment the first week of January.  I discussed that given that she had significant chest pain it would be appropriate to obtain an echocardiogram to ensure there is no change since the last when she had in January 2020.  At that time her echocardiogram was relatively unremarkable.  We participated in shared decision making regarding this plan.  I will schedule an echocardiogram and an appointment for Gail Crawford in our clinic.  She was grateful for the call and I look forward to meeting her.

## 2019-08-11 NOTE — Addendum Note (Signed)
Addended by: Raiford Simmonds on: 08/11/2019 11:22 AM   Modules accepted: Orders

## 2019-08-11 NOTE — Telephone Encounter (Signed)
Per Dr Margaretann Loveless note -  Echocardiogram was ordered and message sent to scheduler to in office appointment.

## 2019-08-21 ENCOUNTER — Other Ambulatory Visit: Payer: Self-pay

## 2019-08-21 ENCOUNTER — Encounter (INDEPENDENT_AMBULATORY_CARE_PROVIDER_SITE_OTHER): Payer: Self-pay | Admitting: Family Medicine

## 2019-08-21 ENCOUNTER — Ambulatory Visit (INDEPENDENT_AMBULATORY_CARE_PROVIDER_SITE_OTHER): Payer: Managed Care, Other (non HMO) | Admitting: Family Medicine

## 2019-08-21 VITALS — BP 135/75 | HR 82 | Temp 98.4°F | Ht 65.0 in | Wt 236.0 lb

## 2019-08-21 DIAGNOSIS — E559 Vitamin D deficiency, unspecified: Secondary | ICD-10-CM | POA: Diagnosis not present

## 2019-08-21 DIAGNOSIS — E1159 Type 2 diabetes mellitus with other circulatory complications: Secondary | ICD-10-CM | POA: Diagnosis not present

## 2019-08-21 DIAGNOSIS — Z9189 Other specified personal risk factors, not elsewhere classified: Secondary | ICD-10-CM

## 2019-08-21 DIAGNOSIS — Z6839 Body mass index (BMI) 39.0-39.9, adult: Secondary | ICD-10-CM

## 2019-08-21 DIAGNOSIS — I152 Hypertension secondary to endocrine disorders: Secondary | ICD-10-CM

## 2019-08-21 DIAGNOSIS — I10 Essential (primary) hypertension: Secondary | ICD-10-CM

## 2019-08-21 DIAGNOSIS — E1169 Type 2 diabetes mellitus with other specified complication: Secondary | ICD-10-CM

## 2019-08-21 DIAGNOSIS — E785 Hyperlipidemia, unspecified: Secondary | ICD-10-CM

## 2019-08-21 NOTE — Patient Instructions (Signed)
GETTING TO GOOD BOWEL HEALTH  Irregular bowel habits such as constipation can lead to many problems over time.  Having one soft bowel movement a day is the most important way to prevent further problems.    The goal: ONE SOFT BOWEL MOVEMENT A DAY!  To have soft, regular bowel movements:  . Drink at least 8 tall glasses of water a day.   . Take plenty of fiber.  Fiber is the undigested part of plant food that passes into the colon, acting s "natures broom" to encourage bowel motility and movement.  Fiber can absorb and hold large amounts of water. This results in a larger, bulkier stool, which is soft and easier to pass. Work gradually over several weeks up to 6 servings a day of fiber (25g a day even more if needed) in the form of: o Vegetables -- Root (potatoes, carrots, turnips), leafy green (lettuce, salad greens, celery, spinach), or cooked high residue (cabbage, broccoli, etc) o Fruit -- Fresh (unpeeled skin & pulp), Dried (prunes, apricots, cherries, etc ),  or stewed ( applesauce)  o Whole grain breads, pasta, etc (whole wheat)  o Bran cereals  . Bulking Agents -- This type of water-retaining fiber generally is easily obtained each day by one of the following:  o Psyllium bran -- The psyllium plant is remarkable because its ground seeds can retain so much water. This product is available as Metamucil, Konsyl, Effersyllium, Per Diem Fiber, or the less expensive generic preparation in drug and health food stores. Although labeled a laxative, it really is not a laxative.  o Methylcellulose -- This is another fiber derived from wood which also retains water. It is available as Citrucel. o Polyethylene Glycol - and "artificial" fiber commonly called Miralax or Glycolax.  It is helpful for people with gassy or bloated feelings with regular fiber o Flax Seed - a less gassy fiber than psyllium . No reading or other relaxing activity while on the toilet. If bowel movements take longer than 5 minutes,  you are too constipated. . AVOID CONSTIPATION.  High fiber and water intake usually takes care of this.  Sometimes a laxative is needed to stimulate more frequent bowel movements, but  . Laxatives are not a good long-term solution as it can wear the colon out. o Osmotics (Milk of Magnesia, Fleets phosphosoda, Magnesium citrate, MiraLax, GoLytely) are safer than  o Stimulants (Senokot, Castor Oil, Dulcolax, Ex Lax)    o Do not take laxatives for more than 7days in a row. .  IF SEVERELY CONSTIPATED, try a Bowel Retraining Program: o Do not use laxatives.  o Eat a diet high in roughage, such as bran cereals and leafy vegetables.  o Drink six (6) ounces of prune or apricot juice each morning.  o Eat two (2) large servings of stewed fruit each day.  o Take one (1) heaping tablespoon of a psyllium-based bulking agent twice a day. Use sugar-free sweetener when possible to avoid excessive calories.  o Eat a normal breakfast.  o Set aside 15 minutes after breakfast to sit on the toilet, but do not strain to have a bowel movement.  o If you do not have a bowel movement by the third day, use an enema and repeat the above steps.   

## 2019-08-22 ENCOUNTER — Telehealth (INDEPENDENT_AMBULATORY_CARE_PROVIDER_SITE_OTHER): Payer: Self-pay | Admitting: Family Medicine

## 2019-08-22 DIAGNOSIS — R1013 Epigastric pain: Secondary | ICD-10-CM

## 2019-08-22 MED ORDER — VITAMIN D (ERGOCALCIFEROL) 1.25 MG (50000 UNIT) PO CAPS: 50000.0000 [IU] | ORAL_CAPSULE | ORAL | 0 refills | Status: DC

## 2019-08-22 NOTE — Telephone Encounter (Signed)
Okay for patient to come in for CBC with diff, CMP, lipase.

## 2019-08-22 NOTE — Telephone Encounter (Signed)
Call placed to patient and she states you all discussed her having lab work to check her gallbladder and pancreas. Patient states she has talked it over with her husband  and would like to have the labs done before the end of the month due to insurance.

## 2019-08-23 ENCOUNTER — Ambulatory Visit (HOSPITAL_COMMUNITY): Payer: Managed Care, Other (non HMO) | Attending: Cardiology

## 2019-08-23 ENCOUNTER — Other Ambulatory Visit: Payer: Self-pay

## 2019-08-23 DIAGNOSIS — R072 Precordial pain: Secondary | ICD-10-CM

## 2019-08-23 LAB — CBC WITH DIFFERENTIAL/PLATELET
Basophils Absolute: 0.1 10*3/uL (ref 0.0–0.2)
Basos: 1 %
EOS (ABSOLUTE): 0.2 10*3/uL (ref 0.0–0.4)
Eos: 2 %
Hematocrit: 38.4 % (ref 34.0–46.6)
Hemoglobin: 12.8 g/dL (ref 11.1–15.9)
Immature Grans (Abs): 0 10*3/uL (ref 0.0–0.1)
Immature Granulocytes: 0 %
Lymphocytes Absolute: 3.6 10*3/uL — ABNORMAL HIGH (ref 0.7–3.1)
Lymphs: 33 %
MCH: 29.2 pg (ref 26.6–33.0)
MCHC: 33.3 g/dL (ref 31.5–35.7)
MCV: 88 fL (ref 79–97)
Monocytes Absolute: 0.8 10*3/uL (ref 0.1–0.9)
Monocytes: 7 %
Neutrophils Absolute: 6.4 10*3/uL (ref 1.4–7.0)
Neutrophils: 57 %
Platelets: 272 10*3/uL (ref 150–450)
RBC: 4.39 x10E6/uL (ref 3.77–5.28)
RDW: 11.9 % (ref 11.7–15.4)
WBC: 11 10*3/uL — ABNORMAL HIGH (ref 3.4–10.8)

## 2019-08-23 LAB — COMPREHENSIVE METABOLIC PANEL
ALT: 9 IU/L (ref 0–32)
AST: 13 IU/L (ref 0–40)
Albumin/Globulin Ratio: 1.7 (ref 1.2–2.2)
Albumin: 4 g/dL (ref 3.8–4.8)
Alkaline Phosphatase: 106 IU/L (ref 39–117)
BUN/Creatinine Ratio: 20 (ref 12–28)
BUN: 13 mg/dL (ref 8–27)
Bilirubin Total: 0.2 mg/dL (ref 0.0–1.2)
CO2: 28 mmol/L (ref 20–29)
Calcium: 9 mg/dL (ref 8.7–10.3)
Chloride: 100 mmol/L (ref 96–106)
Creatinine, Ser: 0.66 mg/dL (ref 0.57–1.00)
GFR calc Af Amer: 107 mL/min/{1.73_m2} (ref >59)
GFR calc non Af Amer: 93 mL/min/{1.73_m2} (ref >59)
Globulin, Total: 2.4 g/dL (ref 1.5–4.5)
Glucose: 121 mg/dL — ABNORMAL HIGH (ref 65–99)
Potassium: 4.1 mmol/L (ref 3.5–5.2)
Sodium: 140 mmol/L (ref 134–144)
Total Protein: 6.4 g/dL (ref 6.0–8.5)

## 2019-08-23 LAB — LIPASE: Lipase: 19 U/L (ref 14–72)

## 2019-08-28 ENCOUNTER — Encounter (INDEPENDENT_AMBULATORY_CARE_PROVIDER_SITE_OTHER): Payer: Self-pay | Admitting: Family Medicine

## 2019-08-28 NOTE — Progress Notes (Signed)
Office: [phone]  /  Fax: [phone]   HPI:  Chief Complaint: OBESITY Gail Crawford is here to discuss her progress with her obesity treatment plan. She is on the Category 2 plan and states she is following her eating plan approximately 50 % of the time. She states she is exercising 0 minutes 0 times per week.  Gail Crawford is up 6 lbs in water weight. Her birthday is today, and she is enjoying celebration eating. She has increased salty foods.  Today's visit was # 22  Starting weight: 266 lbs Starting date: 09/05/2018 Today's weight : 236 lbs Today's date: 08/21/2019 Total lbs lost to date: 30 Total lbs lost since last in-office visit: 0  Hypertension Gail Crawford has a diagnosis of hypertension, associated with diabetes mellitus II. She is taking Cozaar and chlorthalidone.   BP Readings from Last 3 Encounters:  08/21/19 135/75  08/07/19 116/71  08/04/19 (!) 122/57   Diabetes II Gail Crawford has a diagnosis of diabetes type II. Last A1c was 6.0. She is taking metformin and denies hypoglycemia.  Lab Results  Component Value Date   HGBA1C 6.0 (H) 05/25/2019   Hyperlipidemia Gail Crawford has a diagnosis of hyperlipidemia, associated with diabetes mellitus II. She is staking Crestor 20 mg daily, which she started last week. She notes some myalgias, but this has improved.  Lab Results  Component Value Date   CHOL 231 (H) 05/25/2019   HDL 44 05/25/2019   LDLCALC 155 (H) 05/25/2019   TRIG 174 (H) 05/25/2019   CHOLHDL 4.0 01/18/2006   Lab Results  Component Value Date   ALT 9 08/22/2019   AST 13 08/22/2019   ALKPHOS 106 08/22/2019   BILITOT 0.2 08/22/2019   The 10-year ASCVD risk score Gail Crawford DC Jr., et al., 2013) is: 18%   Values used to calculate the score:     Age: 63 years     Sex: Female     Is Non-Hispanic African American: No     Diabetic: Yes     Tobacco smoker: No     Systolic Blood Pressure: [id number] mmHg     Is BP treated: Yes     HDL Cholesterol: 44 mg/dL     Total  Cholesterol: 231 mg/dL  At risk for cardiovascular disease Gail Crawford is at a higher than average risk for cardiovascular disease due to obesity, hypertension, diabetes II, and hyperlipidemia. She currently denies any chest pain.  Vitamin D Deficiency Gail Crawford has a diagnosis of vitamin D deficiency. Last Vit D level was 41.2. She is stable on Vit D.  GERD Gail Crawford has a diagnosis of GERD. She is currently taking Nexium.  ASSESSMENT AND PLAN:  Hypertension associated with diabetes (Gail Crawford)  Type 2 diabetes mellitus with other specified complication, without long-term current use of insulin (Gail Crawford)  Hyperlipidemia associated with type 2 diabetes mellitus (Gail Crawford)  At risk for heart disease  Vitamin D deficiency - Plan: Vitamin D, Ergocalciferol, (DRISDOL) 1.25 MG (50000 UT) CAPS capsule  Class 2 severe obesity with serious comorbidity and body mass index (BMI) of 39.0 to 39.9 in adult, unspecified obesity type (Gail Crawford)  PLAN:  Hypertension Gail Crawford is working on healthy weight loss and exercise to improve blood pressure control. We will watch for signs of hypotension as she continues her lifestyle modifications. Gail Crawford will continue her medications and we will continue to monitor her progress.  Diabetes II Gail Crawford has been given diabetes education by myself today. Good blood sugar control is important to decrease the likelihood of diabetic complications such as  nephropathy, neuropathy, limb loss, blindness, coronary artery disease, and death. Intensive lifestyle modification including diet, exercise and weight loss were discussed as the first line treatment for diabetes. We will continue to monitor her progress.  Hyperlipidemia Intensive lifestyle modifications as the first line treatment for hyperlipidemia. We discussed many lifestyle modifications today and Gail Crawford will continue to work on diet, exercise and weight loss efforts. We will continue to follow up with her  progress.  Cardiovascular risk counseling Gail Crawford was given (~15 minutes) coronary artery disease prevention counseling today. She is 62 y.o. female and has risk factors for heart disease including obesity, hypertension, diabetes II, and hyperlipidemia. We discussed intensive lifestyle modifications today with an emphasis on specific weight loss instructions and strategies.   Vitamin D Deficiency Low Vitamin D level contributes to fatigue and are associated with obesity, breast, and colon cancer. Gail Crawford agrees to continue taking prescription Vitamin D 50,000 IU every week #4 and we will refill for 1 month. She will follow up for routine testing of vitamin D, at least 2-3 times per year to avoid over-replacement. Gail Crawford agrees to follow up with [place] as directed.  GERD We will continue to follow up on her progress.  Obesity Gail Crawford is currently in the action stage of change. As such, her goal is to continue with weight loss efforts. She has agreed to follow the Category 2 plan. Gail Crawford has been instructed to work up to a goal of 150 minutes of combined cardio and strengthening exercise per week for weight loss and overall health benefits. We discussed the following Behavioral Modification Strategies today: increasing lean protein intake, increasing water intake, increasing high fiber foods and celebration eating strategies.   Gail Crawford has agreed to follow-up with our clinic in 2 weeks. She was informed of the importance of frequent follow-up visits to maximize her success with intensive lifestyle modifications for her multiple health conditions.  ALLERGIES: Allergies  Allergen Reactions  . Lisinopril Cough  . Sulfa Antibiotics Nausea And Vomiting    MEDICATIONS: Current Outpatient Medications on File Prior to Visit  Medication Sig Dispense Refill  . chlorthalidone (HYGROTON) 25 MG tablet Take 1 tablet (25 mg total) by mouth daily. 30 tablet 0  . diclofenac (VOLTAREN) 75 MG EC  tablet Take 75 mg by mouth 2 (two) times daily.    Marland Kitchen esomeprazole (NEXIUM) 40 MG capsule Take 40 mg by mouth daily.    . fexofenadine (ALLEGRA) 180 MG tablet Take 180 mg by mouth daily.    Marland Kitchen losartan (COZAAR) 50 MG tablet Take 1 tablet (50 mg total) by mouth daily. 30 tablet 0  . metFORMIN (GLUCOPHAGE) 500 MG tablet Take 1 tablet (500 mg total) by mouth 2 (two) times daily with a meal. 60 tablet 0  . rosuvastatin (CRESTOR) 20 MG tablet Take 1 tablet (20 mg total) by mouth daily. 30 tablet 0   No current facility-administered medications on file prior to visit.    PAST MEDICAL HISTORY: Past Medical History:  Diagnosis Date  . Asthma 01-2004  . Back pain   . 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 HTN (hypertension)   . Joint pain   . Leg edema     PAST SURGICAL HISTORY: Past Surgical History:  Procedure Laterality Date  . ABDOMINAL HYSTERECTOMY  07-15-04   TYPE II RADICAL HYSTERECTOMY WITH BSO  . [place]  . MASS EXCISION Left 06/11/2015   Procedure: EXCISION LEFT ABDOMINAL WALL MASS 6X3CM;  Surgeon: Stark Klein, MD;  Location: [facility];  Service: General;  Laterality: Left;  . ORTHOPEDIC SURGERY     RIGHT SHOULDER AND ELBOW AND RIGHT FOOT    SOCIAL HISTORY: Social History   Tobacco Use  . Smoking status: Never Smoker  . Smokeless tobacco: Never Used  Substance Use Topics  . Alcohol use: No  . Drug use: No    FAMILY HISTORY: Family History  Problem Relation Age of Onset  . Heart disease Mother   . Heart disease Father     ROS: Review of Systems  Constitutional: Negative for weight loss.  Cardiovascular: Negative for chest pain.  Musculoskeletal: Positive for myalgias.  Endo/Heme/Allergies:       Negative hypoglycemia    PHYSICAL EXAM: Blood pressure 135/75, pulse 82, temperature 98.4 F (36.9 C), height 5\' 5"  (1.651 m), weight 236 lb (107 kg), SpO2 98 %. Body mass index is 39.27 kg/m. Physical Exam Vitals  reviewed.  Constitutional:      Appearance: Normal appearance. She is obese.  Cardiovascular:     Rate and Rhythm: Normal rate.     Pulses: Normal pulses.  Pulmonary:     Effort: Pulmonary effort is normal.     Breath sounds: Normal breath sounds.  Musculoskeletal:        General: Normal range of motion.  Skin:    General: Skin is warm and dry.  Neurological:     Mental Status: She is alert and oriented to person, place, and time.  Psychiatric:        Mood and Affect: Mood normal.        Behavior: Behavior normal.     RECENT LABS AND TESTS: BMET    Component Value Date/Time   NA 140 08/22/2019 1541   K 4.1 08/22/2019 1541   CL 100 08/22/2019 1541   CO2 28 08/22/2019 1541   GLUCOSE 121 (H) 08/22/2019 1541   GLUCOSE 93 08/04/2019 1016   BUN 13 08/22/2019 1541   CREATININE 0.66 08/22/2019 1541   CALCIUM 9.0 08/22/2019 1541   GFRNONAA 93 08/22/2019 1541   GFRAA 107 08/22/2019 1541   Lab Results  Component Value Date   HGBA1C 6.0 (H) 05/25/2019   HGBA1C 5.8 (H) 01/03/2019   HGBA1C 7.1 (H) 09/05/2018   Lab Results  Component Value Date   INSULIN 8.6 05/25/2019   INSULIN 13.3 01/03/2019   INSULIN 15.8 09/05/2018   CBC    Component Value Date/Time   WBC 11.0 (H) 08/22/2019 1541   WBC 8.3 08/04/2019 1016   RBC 4.39 08/22/2019 1541   RBC 4.79 08/04/2019 1016   HGB 12.8 08/22/2019 1541   HGB 13.6 01/15/2012 0908   HCT 38.4 08/22/2019 1541   HCT 40.9 01/15/2012 0908   PLT 272 08/22/2019 1541   MCV 88 08/22/2019 1541   MCV 87.8 01/15/2012 0908   MCH 29.2 08/22/2019 1541   MCH 29.2 08/04/2019 1016   MCHC 33.3 08/22/2019 1541   MCHC 33.0 08/04/2019 1016   RDW 11.9 08/22/2019 1541   RDW 13.0 01/15/2012 0908   LYMPHSABS 3.6 (H) 08/22/2019 1541   LYMPHSABS 1.9 01/15/2012 0908   MONOABS 0.4 01/15/2012 0908   EOSABS 0.2 08/22/2019 1541   BASOSABS 0.1 08/22/2019 1541   BASOSABS 0.0 01/15/2012 0908   Iron/TIBC/Ferritin/ %Sat No results found for: IRON, TIBC,  FERRITIN, IRONPCTSAT Lipid Panel     Component Value Date/Time   CHOL 231 (H) 05/25/2019 1523   TRIG 174 (H) 05/25/2019 1523   HDL 44 05/25/2019  1523   CHOLHDL 4.0 01/18/2006 0904   VLDL 28 01/18/2006 0904   LDLCALC 155 (H) 05/25/2019 1523   Hepatic Function Panel     Component Value Date/Time   PROT 6.4 08/22/2019 1541   ALBUMIN 4.0 08/22/2019 1541   AST 13 08/22/2019 1541   ALT 9 08/22/2019 1541   ALKPHOS 106 08/22/2019 1541   BILITOT 0.2 08/22/2019 1541      Component Value Date/Time   TSH 1.920 09/05/2018 0000   I, Trixie Dredge, am acting as transcriptionist for Briscoe Deutscher, DO.  I have reviewed the above documentation for accuracy and completeness, and I agree with the above. Briscoe Deutscher, DO

## 2019-08-30 DIAGNOSIS — K219 Gastro-esophageal reflux disease without esophagitis: Secondary | ICD-10-CM | POA: Insufficient documentation

## 2019-09-02 ENCOUNTER — Other Ambulatory Visit (INDEPENDENT_AMBULATORY_CARE_PROVIDER_SITE_OTHER): Payer: Self-pay | Admitting: Family Medicine

## 2019-09-02 DIAGNOSIS — I10 Essential (primary) hypertension: Secondary | ICD-10-CM

## 2019-09-04 ENCOUNTER — Encounter (INDEPENDENT_AMBULATORY_CARE_PROVIDER_SITE_OTHER): Payer: Self-pay | Admitting: Family Medicine

## 2019-09-05 MED ORDER — CHLORTHALIDONE 25 MG PO TABS: 25.0000 mg | ORAL_TABLET | Freq: Every day | ORAL | 0 refills | Status: DC

## 2019-09-05 MED ORDER — LOSARTAN POTASSIUM 50 MG PO TABS: 50.0000 mg | ORAL_TABLET | Freq: Every day | ORAL | 0 refills | Status: DC

## 2019-09-08 ENCOUNTER — Ambulatory Visit: Payer: Managed Care, Other (non HMO) | Admitting: Internal Medicine

## 2019-09-18 ENCOUNTER — Ambulatory Visit (INDEPENDENT_AMBULATORY_CARE_PROVIDER_SITE_OTHER): Payer: Managed Care, Other (non HMO) | Admitting: Family Medicine

## 2019-09-18 ENCOUNTER — Other Ambulatory Visit: Payer: Self-pay

## 2019-09-18 ENCOUNTER — Encounter (INDEPENDENT_AMBULATORY_CARE_PROVIDER_SITE_OTHER): Payer: Self-pay | Admitting: Family Medicine

## 2019-09-18 VITALS — BP 144/74 | HR 77 | Temp 98.7°F | Ht 65.0 in | Wt 232.0 lb

## 2019-09-18 DIAGNOSIS — U071 COVID-19: Secondary | ICD-10-CM

## 2019-09-18 DIAGNOSIS — E7849 Other hyperlipidemia: Secondary | ICD-10-CM

## 2019-09-18 DIAGNOSIS — I10 Essential (primary) hypertension: Secondary | ICD-10-CM | POA: Diagnosis not present

## 2019-09-18 DIAGNOSIS — R7303 Prediabetes: Secondary | ICD-10-CM

## 2019-09-18 DIAGNOSIS — Z6838 Body mass index (BMI) 38.0-38.9, adult: Secondary | ICD-10-CM

## 2019-09-18 DIAGNOSIS — E559 Vitamin D deficiency, unspecified: Secondary | ICD-10-CM

## 2019-09-18 DIAGNOSIS — Z9189 Other specified personal risk factors, not elsewhere classified: Secondary | ICD-10-CM

## 2019-09-18 MED ORDER — VITAMIN D (ERGOCALCIFEROL) 1.25 MG (50000 UNIT) PO CAPS: 50000.0000 [IU] | ORAL_CAPSULE | ORAL | 0 refills | Status: DC

## 2019-09-18 MED ORDER — METFORMIN HCL 500 MG PO TABS: 500.0000 mg | ORAL_TABLET | Freq: Two times a day (BID) | ORAL | 0 refills | Status: DC

## 2019-09-18 MED ORDER — CHLORTHALIDONE 25 MG PO TABS: 25.0000 mg | ORAL_TABLET | Freq: Every day | ORAL | 0 refills | Status: DC

## 2019-09-19 NOTE — Progress Notes (Signed)
Chief Complaint:   OBESITY Gail Crawford is here to discuss her progress with her obesity treatment plan along with follow-up of her obesity related diagnoses. Gail Crawford is on the Category 2 Plan and states she is following her eating plan approximately 10% of the time. Gail Crawford states she is doing 0 minutes 0 times per week.  Today's visit was #: 23 Starting weight: 266 lbs Starting date: 09/05/2018 Today's weight: 232 lbs Today's date: 09/18/2019 Total lbs lost to date: 34 Total lbs lost since last in-office visit: 4  Interim History: Gail Crawford has been sick with [place] recently, and she hasn't had much of an appetite. She hasn't been eating as much, but she is snacking a bit more and has lost her sense of taste and smell still.  Subjective:   1. Lab test positive for detection of COVID-19 virus Gail Crawford tested positive for COVID19 on 09/01/2019. She had mild symptoms, but she still has lost her sense of taste. She still notes fatigue but this has improved. She has questions about when her symptoms would resolve.  2. Essential hypertension Gail Crawford's blood pressure is slightly elevated today. She is stable on medications and her blood pressure is normally better controlled.  3. Vitamin D deficiency Gail Crawford is stable on Vit D, but her level is not yet at goal. She requests a refill today.  4. Pre-diabetes Gail Crawford is stable on metformin. She is due for labs soon. She has been working on diet and weight loss.  5. Other hyperlipidemia Gail Crawford had myalgias, so she stopped Crestor. She would like to continue with diet if possible.  6. At risk for heart disease Gail Crawford is at a higher than average risk for cardiovascular disease due to obesity. Reviewed: no chest pain on exertion, no dyspnea on exertion, and no swelling of ankles.  Assessment/Plan:   1. Lab test positive for detection of COVID-19 virus We discussed it could take another month or even longer for her taste to  return, and for fatigue to improve. We will continue to monitor and I encourage the patient to wear her mask, wait 6 feet apart, and wash her hands.  2. Essential hypertension Gail Crawford is working on healthy weight loss and exercise to improve blood pressure control. We will watch for signs of hypotension as she continues her lifestyle modifications. We will refill chlorthalidone and losartan for 1 month. We will continue to monitor.  - chlorthalidone (HYGROTON) 25 MG tablet; Take 1 tablet (25 mg total) by mouth daily.  Dispense: 30 tablet; Refill: 0  3. Vitamin D deficiency Low Vitamin D level contributes to fatigue and are associated with obesity, breast, and colon cancer. We will refill prescription Vit D for 1 month, and we will recheck labs at her next visit. She will follow-up for routine testing of Vitamin D, at least 2-3 times per year to avoid over-replacement. We will continue to monitor.  - Vitamin D, Ergocalciferol, (DRISDOL) 1.25 MG (50000 UNIT) CAPS capsule; Take 1 capsule (50,000 Units total) by mouth every 7 (seven) days.  Dispense: 4 capsule; Refill: 0  4. Pre-diabetes Gail Crawford will continue to work on weight loss, exercise, and decreasing simple carbohydrates to help decrease the risk of diabetes. We will refill metformin for 1 month, and we will recheck labs at her next visit. We will continue to monitor.  - metFORMIN (GLUCOPHAGE) 500 MG tablet; Take 1 tablet (500 mg total) by mouth 2 (two) times daily with a meal.  Dispense: 60 tablet; Refill: 0  5.  Other hyperlipidemia Cardiovascular risk and specific lipid/LDL goals reviewed. We discussed several lifestyle modifications today and Gail Crawford will continue to work on diet, exercise and weight loss efforts. Orders and follow up as documented in patient record.   Counseling Intensive lifestyle modifications are the first line treatment for this issue. . Dietary changes: Increase soluble fiber. Decrease simple  carbohydrates. . Exercise changes: Moderate to vigorous-intensity aerobic activity 150 minutes per week if tolerated. . Lipid-lowering medications: see documented in medical record.  6. At risk for heart disease Gail Crawford was given approximately 15 minutes of coronary artery disease prevention counseling today. She is 64 y.o. female and has risk factors for heart disease including obesity. We discussed intensive lifestyle modifications today with an emphasis on specific weight loss instructions and strategies.   7. Class 2 severe obesity with serious comorbidity and body mass index (BMI) of 38.0 to 38.9 in adult, unspecified obesity type Gail Crawford) Gail Crawford is currently in the action stage of change. As such, her goal is to continue with weight loss efforts. She has agreed to the Category 2 Plan.   Gail Crawford will work on increasing protein at least until her appetite returns, to help prevent her metabolism from dropping further.  Behavioral modification strategies: increasing lean protein intake.  Gail Crawford has agreed to follow-up with our clinic in 3 to 4 weeks. She was informed of the importance of frequent follow-up visits to maximize her success with intensive lifestyle modifications for her multiple health conditions.   Objective:   Blood pressure (!) 144/74, pulse 77, temperature 98.7 F (37.1 C), temperature source Oral, height 5\' 5"  (1.651 m), weight 232 lb (105.2 kg), SpO2 99 %. Body mass index is 38.61 kg/m.  General: Cooperative, alert, well developed, in no acute distress. HEENT: Conjunctivae and lids unremarkable. Cardiovascular: Regular rhythm.  Lungs: Normal work of breathing. Neurologic: No focal deficits.   Lab Results  Component Value Date   CREATININE 0.66 08/22/2019   BUN 13 08/22/2019   NA 140 08/22/2019   K 4.1 08/22/2019   CL 100 08/22/2019   CO2 28 08/22/2019   Lab Results  Component Value Date   ALT 9 08/22/2019   AST 13 08/22/2019   ALKPHOS 106  08/22/2019   BILITOT 0.2 08/22/2019   Lab Results  Component Value Date   HGBA1C 6.0 (H) 05/25/2019   HGBA1C 5.8 (H) 01/03/2019   HGBA1C 7.1 (H) 09/05/2018   Lab Results  Component Value Date   INSULIN 8.6 05/25/2019   INSULIN 13.3 01/03/2019   INSULIN 15.8 09/05/2018   Lab Results  Component Value Date   TSH 1.920 09/05/2018   Lab Results  Component Value Date   CHOL 231 (H) 05/25/2019   HDL 44 05/25/2019   LDLCALC 155 (H) 05/25/2019   TRIG 174 (H) 05/25/2019   CHOLHDL 4.0 01/18/2006   Lab Results  Component Value Date   WBC 11.0 (H) 08/22/2019   HGB 12.8 08/22/2019   HCT 38.4 08/22/2019   MCV 88 08/22/2019   PLT 272 08/22/2019   No results found for: IRON, TIBC, FERRITIN  Attestation Statements:   Reviewed by clinician on day of visit: allergies, medications, problem list, medical history, surgical history, family history, social history, and previous encounter notes.   I, Trixie Dredge, am acting as transcriptionist for Dennard Nip, MD.  I have reviewed the above documentation for accuracy and completeness, and I agree with the above. -  Dennard Nip, MD

## 2019-09-20 ENCOUNTER — Telehealth: Payer: Self-pay | Admitting: Internal Medicine

## 2019-09-20 MED ORDER — LOSARTAN POTASSIUM 50 MG PO TABS: 50.0000 mg | ORAL_TABLET | Freq: Every day | ORAL | 0 refills | Status: DC

## 2019-09-20 NOTE — Telephone Encounter (Signed)
Since she was covid positive within past several weeks and we cannot confirm if her contacts were exposed, would not recommend anyone join her for the visit. Otherwise defer to office policies on visitors. I will be happy to call and have him on speaker phone for the visit.   Thanks, GA

## 2019-09-20 NOTE — Telephone Encounter (Signed)
New Message  Patient is calling in to get approval for husband to accompany her to her appointment with Dr. Margaretann Loveless on 09/21/19 at 2:00pm. Patient states that she needs her husband there with her due to this being the first visit after being discharged from the hospital. Informed that it would have to be a medical necessity, and patient wanted to to check anyway since she really wants husband there. Please give a call back to confirm.

## 2019-09-20 NOTE — Telephone Encounter (Signed)
Spoke with the pt re: her husband coming to her appt with Dr. Margaretann Loveless 09/21/19.. she says that she has a tendency to not listen and usually he listens better than her but she denies any disabilities and will have her husband bring her and stay in the car and use Speakerphone on her cell so he can listen in on the visit and communicate with the MD and if needed and if determined by Dr. Margaretann Loveless that he needs to be present physically will be asked to come up to the appt.   Pt was COVID positive on 09/01/19 but denies any fever, cough, and told by her PCP she can be out in public but with her mask and socially distant from others.   Will forward to Dr. Margaretann Loveless for review.

## 2019-09-20 NOTE — Telephone Encounter (Signed)
Pt advised and agrees with plan to have her husband not attend appt with her and to have him on Speakerphone.

## 2019-09-21 ENCOUNTER — Other Ambulatory Visit: Payer: Self-pay

## 2019-09-21 ENCOUNTER — Encounter: Payer: Self-pay | Admitting: Internal Medicine

## 2019-09-21 ENCOUNTER — Ambulatory Visit (INDEPENDENT_AMBULATORY_CARE_PROVIDER_SITE_OTHER): Payer: Managed Care, Other (non HMO) | Admitting: Internal Medicine

## 2019-09-21 VITALS — BP 122/58 | HR 90 | Temp 97.0°F | Ht 65.0 in | Wt 242.2 lb

## 2019-09-21 DIAGNOSIS — E1159 Type 2 diabetes mellitus with other circulatory complications: Secondary | ICD-10-CM

## 2019-09-21 DIAGNOSIS — I10 Essential (primary) hypertension: Secondary | ICD-10-CM

## 2019-09-21 DIAGNOSIS — R9431 Abnormal electrocardiogram [ECG] [EKG]: Secondary | ICD-10-CM

## 2019-09-21 DIAGNOSIS — R072 Precordial pain: Secondary | ICD-10-CM | POA: Diagnosis not present

## 2019-09-21 DIAGNOSIS — E785 Hyperlipidemia, unspecified: Secondary | ICD-10-CM

## 2019-09-21 DIAGNOSIS — E1169 Type 2 diabetes mellitus with other specified complication: Secondary | ICD-10-CM | POA: Diagnosis not present

## 2019-09-21 NOTE — Patient Instructions (Signed)
Medication Instructions:  Your physician recommends that you continue on your current medications as directed. Please refer to the Current Medication list given to you today.  *If you need a refill on your cardiac medications before your next appointment, please call your pharmacy*  Follow-Up: At [facility], you and your health needs are our priority.  As part of our continuing mission to provide you with exceptional heart care, we have created designated Provider Care Teams.  These Care Teams include your primary Cardiologist (physician) and Advanced Practice Providers (APPs -  Physician Assistants and Nurse Practitioners) who all work together to provide you with the care you need, when you need it.  Your next appointment:   2 month(s)  The format for your next appointment:   In Person  Provider:   Cherlynn Kaiser, MD  Other Instructions

## 2019-09-21 NOTE — Progress Notes (Signed)
Cardiology Office Note:    Date:  09/21/2019   ID:  Gail Crawford, DOB Dec 02, 1955, MRN [id number]  PCP:  Chesley Noon, MD  Cardiologist:  No primary care provider on file.  Electrophysiologist:  None   Referring MD: Chesley Noon, MD   Chief Complaint: f/u chest pain ER visit  History of Present Illness:    Gail Crawford is a 64 y.o. female with a hx of hypertension, hyperlipidemia, and obesity who presented to the ER on 08/04/2019 with 10 out of 10 chest pain. Her troponins were unremarkable and ECG was negative for ischemia. Functional imaging was recommended. Given her adequate heart rate CT coronary angiogram was pursued. I personally interpreted this CCTA which demonstrated nonobstructive CAD.  Before I was able to see the patient in consultation she had been dismissed from the emergency department.  We meet today for the first time.  She has not yet been established in our cardiovascular practice.  Overall she has been doing well since her emergency department visit until yesterday when she had mild recurrent symptoms.  She does however say that the belching she was experiencing and the chest discomfort have improved.  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  She told me on the phone shortly after her ER visit that she had been working at "200% capacity".  Since I was unable to see the patient in the ER I was unable at that time to assess her for symptoms of pericarditis, however it did seem that she endorsed some symptoms of worsening chest pain with deep inspiration.  Today she has no significant symptoms in that regard.  Both parents died from MI,  Father age 50 yo, MI with sudden cardiac death. Mom 34 yo MI with sudden cardiac death. Brother pacemaker   Past Medical History:  Diagnosis Date  . Asthma 01-2004  . Back pain   . 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 HTN (hypertension)   .  Joint pain   . Leg edema     Past Surgical History:  Procedure Laterality Date  . ABDOMINAL HYSTERECTOMY  07-15-04   TYPE II RADICAL HYSTERECTOMY WITH BSO  . [place]  . MASS EXCISION Left 06/11/2015   Procedure: EXCISION LEFT ABDOMINAL WALL MASS 6X3CM;  Surgeon: Stark Klein, MD;  Location: [place];  Service: General;  Laterality: Left;  . ORTHOPEDIC SURGERY     RIGHT SHOULDER AND ELBOW AND RIGHT FOOT    Current Medications: Current Meds  Medication Sig  . chlorthalidone (HYGROTON) 25 MG tablet Take 1 tablet (25 mg total) by mouth daily.  . diclofenac (VOLTAREN) 75 MG EC tablet Take 75 mg by mouth 2 (two) times daily.  Marland Kitchen esomeprazole (NEXIUM) 40 MG capsule Take 40 mg by mouth daily.  . fexofenadine (ALLEGRA) 180 MG tablet Take 180 mg by mouth daily.  Marland Kitchen losartan (COZAAR) 50 MG tablet Take 1 tablet (50 mg total) by mouth daily.  . metFORMIN (GLUCOPHAGE) 500 MG tablet Take 1 tablet (500 mg total) by mouth 2 (two) times daily with a meal.  . Vitamin D, Ergocalciferol, (DRISDOL) 1.25 MG (50000 UNIT) CAPS capsule Take 1 capsule (50,000 Units total) by mouth every 7 (seven) days.     Allergies:   Lisinopril and Sulfa antibiotics   Social History   Socioeconomic History  . Marital status: Married    Spouse name: Programme researcher, broadcasting/film/video  .  Number of children: Not on file  . Years of education: Not on file  . Highest education level: Not on file  Occupational History  . Occupation: Scientist, water quality  Tobacco Use  . Smoking status: Never Smoker  . Smokeless tobacco: Never Used  Substance and Sexual Activity  . Alcohol use: No  . Drug use: No  . Sexual activity: Not on file  Other Topics Concern  . Not on file  Social History Narrative  . Not on file   Social Determinants of Health   Financial Resource Strain:   . Difficulty of Paying Living Expenses: Not on file  Food Insecurity:   . Worried About Charity fundraiser in the Last Year: Not on file  . Ran Out  of Food in the Last Year: Not on file  Transportation Needs:   . Lack of Transportation (Medical): Not on file  . Lack of Transportation (Non-Medical): Not on file  Physical Activity:   . Days of Exercise per Week: Not on file  . Minutes of Exercise per Session: Not on file  Stress:   . Feeling of Stress : Not on file  Social Connections:   . Frequency of Communication with Friends and Family: Not on file  . Frequency of Social Gatherings with Friends and Family: Not on file  . Attends Religious Services: Not on file  . Active Member of Clubs or Organizations: Not on file  . Attends Archivist Meetings: Not on file  . Marital Status: Not on file     Family History: The patient's family history includes Heart disease in her father and mother.  ROS:   Please see the history of present illness.    All other systems reviewed and are negative.  EKGs/Labs/Other Studies Reviewed:    The following studies were reviewed today:  EKG:  SR, LVH  Recent Labs: 08/22/2019: ALT 9; BUN 13; Creatinine, Ser 0.66; Hemoglobin 12.8; Platelets 272; Potassium 4.1; Sodium 140  Recent Lipid Panel    Component Value Date/Time   CHOL 231 (H) 05/25/2019 1523   TRIG 174 (H) 05/25/2019 1523   HDL 44 05/25/2019 1523   CHOLHDL 4.0 01/18/2006 0904   VLDL 28 01/18/2006 0904   LDLCALC 155 (H) 05/25/2019 1523    Physical Exam:    VS:  BP (!) 122/58   Pulse 90   Temp (!) 97 F (36.1 C)   Ht 5\' 5"  (1.651 m)   Wt 242 lb 3.2 oz (109.9 kg)   SpO2 96%   BMI 40.30 kg/m     Wt Readings from Last 5 Encounters:  09/21/19 242 lb 3.2 oz (109.9 kg)  09/18/19 232 lb (105.2 kg)  08/21/19 236 lb (107 kg)  08/07/19 229 lb (103.9 kg)  07/17/19 231 lb (104.8 kg)     Constitutional: No acute distress Eyes: sclera non-icteric, normal conjunctiva and lids ENMT: normal dentition, moist mucous membranes Cardiovascular: regular rhythm, normal rate, no murmurs. S1 and S2 normal. Radial pulses normal  bilaterally. No jugular venous distention.  Respiratory: clear to auscultation bilaterally GI : normal bowel sounds, soft and nontender. No distention.   MSK: extremities warm, well perfused. No edema.  NEURO: grossly nonfocal exam, moves all extremities. PSYCH: alert and oriented x 3, normal mood and affect.    ASSESSMENT:    1. Precordial pain   2. Hypertension associated with diabetes ([place])   3. Hyperlipidemia associated with type 2 diabetes mellitus ([place])    PLAN:    Chest  pain - No obstructive CAD on CCTA. We discussed secondary prevention of CAD. Continue crestor 20 mg daily. Lipid panel needed at next visit. Consider pericarditis if recurrent symptoms, can consider use of ibuprofen and colchicine if continued pleuritic sounding chest pain. Normal echo, no pericardial effusion seen. No RWMA.   HTN - continue chlorthalidone, losartan. BP stable today.   HLD - continue crestor 20 mg daily, suboptimal LDL as of Sept 2020, recheck at next visit.   DM - on metformin  Abnormal EKG - LVH on ecg, reviewed echo with demonstrated normal EF, no LVH, no regionals, normal diastolic function and no pericardial effusion.   Cherlynn Kaiser, MD [place]  [facility]   Medication Adjustments/Labs and Tests Ordered: Current medicines are reviewed at length with the patient today.  Concerns regarding medicines are outlined above.  No orders of the defined types were placed in this encounter.  No orders of the defined types were placed in this encounter.   Patient Instructions  Medication Instructions:  Your physician recommends that you continue on your current medications as directed. Please refer to the Current Medication list given to you today.  *If you need a refill on your cardiac medications before your next appointment, please call your pharmacy*  Follow-Up: At [facility], you and your health needs are our priority.  As part of our continuing mission to provide you  with exceptional heart care, we have created designated Provider Care Teams.  These Care Teams include your primary Cardiologist (physician) and Advanced Practice Providers (APPs -  Physician Assistants and Nurse Practitioners) who all work together to provide you with the care you need, when you need it.  Your next appointment:   2 month(s)  The format for your next appointment:   In Person  Provider:   Cherlynn Kaiser, MD  Other Instructions

## 2019-09-25 ENCOUNTER — Ambulatory Visit: Payer: Managed Care, Other (non HMO) | Admitting: Cardiovascular Disease

## 2019-10-09 ENCOUNTER — Ambulatory Visit (INDEPENDENT_AMBULATORY_CARE_PROVIDER_SITE_OTHER): Payer: Managed Care, Other (non HMO) | Admitting: Family Medicine

## 2019-10-09 ENCOUNTER — Encounter (INDEPENDENT_AMBULATORY_CARE_PROVIDER_SITE_OTHER): Payer: Self-pay | Admitting: Family Medicine

## 2019-10-09 ENCOUNTER — Other Ambulatory Visit: Payer: Self-pay

## 2019-10-09 VITALS — BP 154/85 | HR 83 | Temp 97.8°F | Ht 65.0 in | Wt 238.0 lb

## 2019-10-09 DIAGNOSIS — E1159 Type 2 diabetes mellitus with other circulatory complications: Secondary | ICD-10-CM | POA: Diagnosis not present

## 2019-10-09 DIAGNOSIS — E1169 Type 2 diabetes mellitus with other specified complication: Secondary | ICD-10-CM

## 2019-10-09 DIAGNOSIS — Z6839 Body mass index (BMI) 39.0-39.9, adult: Secondary | ICD-10-CM

## 2019-10-09 DIAGNOSIS — Z79899 Other long term (current) drug therapy: Secondary | ICD-10-CM

## 2019-10-09 DIAGNOSIS — F3289 Other specified depressive episodes: Secondary | ICD-10-CM

## 2019-10-09 DIAGNOSIS — E785 Hyperlipidemia, unspecified: Secondary | ICD-10-CM

## 2019-10-09 DIAGNOSIS — E559 Vitamin D deficiency, unspecified: Secondary | ICD-10-CM

## 2019-10-09 DIAGNOSIS — Z9189 Other specified personal risk factors, not elsewhere classified: Secondary | ICD-10-CM | POA: Diagnosis not present

## 2019-10-09 DIAGNOSIS — I10 Essential (primary) hypertension: Secondary | ICD-10-CM

## 2019-10-09 DIAGNOSIS — E119 Type 2 diabetes mellitus without complications: Secondary | ICD-10-CM

## 2019-10-09 MED ORDER — BUPROPION HCL ER (SR) 150 MG PO TB12: 150.0000 mg | ORAL_TABLET | Freq: Every day | ORAL | 0 refills | Status: DC

## 2019-10-09 NOTE — Progress Notes (Signed)
Chief Complaint:   OBESITY Gail Crawford is here to discuss her progress with her obesity treatment plan along with follow-up of her obesity related diagnoses. Gail Crawford is on the Category 2 Plan and states she is following her eating plan approximately 30% of the time. Gail Crawford states she is exercising for 0 minutes 0 times per week.  Today's visit was #: 24 Starting weight: 266 lbs Starting date: 09/05/2018 Today's weight: 238 lbs Today's date: 10/09/2019 Total lbs lost to date: 28 lbs Total lbs lost since last in-office visit: 0  Interim History: Gail Crawford has returned to work.  She says she is struggling with cravings and is eating all the time.  She admits to eating food that she does not even really like.  Increased processed food intake.  Subjective:   1. Type 2 diabetes mellitus without complication, without long-term current use of insulin (HCC) Medications reviewed. She is taking metformin 500 mg twice daily.  Lab Results  Component Value Date   HGBA1C 6.0 (H) 05/25/2019   HGBA1C 5.8 (H) 01/03/2019   HGBA1C 7.1 (H) 09/05/2018   Lab Results  Component Value Date   LDLCALC 155 (H) 05/25/2019   CREATININE 0.66 08/22/2019   Lab Results  Component Value Date   INSULIN 8.6 05/25/2019   INSULIN 13.3 01/03/2019   INSULIN 15.8 09/05/2018   2. Hypertension associated with diabetes ([place]) Review: taking medications as instructed, no medication side effects noted, no chest pain on exertion, no dyspnea on exertion, no swelling of ankles.  Gail Crawford takes losartan and chlorthalidone for blood pressure control.  BP Readings from Last 3 Encounters:  10/09/19 (!) 154/85  09/21/19 (!) 122/58  09/18/19 (!) 144/74   3. Hyperlipidemia associated with type 2 diabetes mellitus ([place]) Gail Crawford has hyperlipidemia and has been trying to improve her cholesterol levels with intensive lifestyle modification including a low saturated fat diet, exercise and weight loss. She denies any chest  pain, claudication or myalgias.  She is not currently on a statin.  Lab Results  Component Value Date   ALT 9 08/22/2019   AST 13 08/22/2019   ALKPHOS 106 08/22/2019   BILITOT 0.2 08/22/2019   Lab Results  Component Value Date   CHOL 231 (H) 05/25/2019   HDL 44 05/25/2019   LDLCALC 155 (H) 05/25/2019   TRIG 174 (H) 05/25/2019   CHOLHDL 4.0 01/18/2006   4. Other depression, with emotinal eating Gail Crawford is struggling with emotional eating and using food for comfort to the extent that it is negatively impacting her health. She has been working on behavior modification techniques to help reduce her emotional eating and has been unsuccessful. She shows no sign of suicidal or homicidal ideations.    5. At risk for heart disease Gail Crawford is at a higher than average risk for cardiovascular disease due to obesity. Reviewed: no chest pain on exertion, no dyspnea on exertion, and no swelling of ankles.  Assessment/Plan:   1. Type 2 diabetes mellitus without complication, without long-term current use of insulin (HCC) Good blood sugar control is important to decrease the likelihood of diabetic complications such as nephropathy, neuropathy, limb loss, blindness, coronary artery disease, and death. Intensive lifestyle modification including diet, exercise and weight loss are the first line of treatment for diabetes.   Orders - Comprehensive metabolic panel - Hemoglobin A1c  2. Hypertension associated with diabetes ([place]) Gail Crawford is working on healthy weight loss and exercise to improve blood pressure control. We will watch for signs of hypotension as  she continues her lifestyle modifications.  Orders - Comprehensive metabolic panel - CBC with Differential/Platelet  3. Hyperlipidemia associated with type 2 diabetes mellitus ([place]) Cardiovascular risk and specific lipid/LDL goals reviewed.  We discussed several lifestyle modifications today and Gail Crawford will continue to work on diet,  exercise and weight loss efforts. Orders and follow up as documented in patient record.   Counseling Intensive lifestyle modifications are the first line treatment for this issue. . Dietary changes: Increase soluble fiber. Decrease simple carbohydrates. . Exercise changes: Moderate to vigorous-intensity aerobic activity 150 minutes per week if tolerated. . Lipid-lowering medications: see documented in medical record.  Orders - Lipid panel  4. Other depression, with emotional eating Behavior modification techniques were discussed today to help Gail Crawford deal with her emotional/non-hunger eating behaviors.  Orders and follow up as documented in patient record.  Will start patient on Wellbutrin, as below.  Orders - VITAMIN D 25 Hydroxy (Vit-D Deficiency, Fractures) - Vitamin B12 - buPROPion (WELLBUTRIN SR) 150 MG 12 hr tablet; Take 1 tablet (150 mg total) by mouth daily.  Dispense: 30 tablet; Refill: 0  5. At risk for heart disease Gail Crawford was given approximately 15 minutes of coronary artery disease prevention counseling today. She is 64 y.o. female and has risk factors for heart disease including obesity. We discussed intensive lifestyle modifications today with an emphasis on specific weight loss instructions and strategies.   Repetitive spaced learning was employed today to elicit superior memory formation and behavioral change.  6. Class 2 severe obesity with serious comorbidity and body mass index (BMI) of 39.0 to 39.9 in adult, unspecified obesity type [facility]) Gail Crawford is currently in the action stage of change. As such, her goal is to continue with weight loss efforts. She has agreed to the Category 2 Plan.   Exercise goals: For substantial health benefits, adults should do at least 150 minutes (2 hours and 30 minutes) a week of moderate-intensity, or 75 minutes (1 hour and 15 minutes) a week of vigorous-intensity aerobic physical activity, or an equivalent combination of moderate-  and vigorous-intensity aerobic activity. Aerobic activity should be performed in episodes of at least 10 minutes, and preferably, it should be spread throughout the week.  Behavioral modification strategies: increasing lean protein intake, decreasing simple carbohydrates, increasing vegetables and increasing water intake.  Gail Crawford has agreed to follow-up with our clinic in 2 weeks. She was informed of the importance of frequent follow-up visits to maximize her success with intensive lifestyle modifications for her multiple health conditions.   Gail Crawford was informed we would discuss her lab results at her next visit unless there is a critical issue that needs to be addressed sooner. Gail Crawford agreed to keep her next visit at the agreed upon time to discuss these results.  Objective:   Blood pressure (!) 154/85, pulse 83, temperature 97.8 F (36.6 C), temperature source Oral, height 5\' 5"  (1.651 m), weight 238 lb (108 kg), SpO2 98 %. Body mass index is 39.61 kg/m.  General: Cooperative, alert, well developed, in no acute distress. HEENT: Conjunctivae and lids unremarkable. Cardiovascular: Regular rhythm.  Lungs: Normal work of breathing. Neurologic: No focal deficits.   Lab Results  Component Value Date   CREATININE 0.66 08/22/2019   BUN 13 08/22/2019   NA 140 08/22/2019   K 4.1 08/22/2019   CL 100 08/22/2019   CO2 28 08/22/2019   Lab Results  Component Value Date   ALT 9 08/22/2019   AST 13 08/22/2019   ALKPHOS 106 08/22/2019  BILITOT 0.2 08/22/2019   Lab Results  Component Value Date   HGBA1C 6.0 (H) 05/25/2019   HGBA1C 5.8 (H) 01/03/2019   HGBA1C 7.1 (H) 09/05/2018   Lab Results  Component Value Date   INSULIN 8.6 05/25/2019   INSULIN 13.3 01/03/2019   INSULIN 15.8 09/05/2018   Lab Results  Component Value Date   TSH 1.920 09/05/2018   Lab Results  Component Value Date   CHOL 231 (H) 05/25/2019   HDL 44 05/25/2019   LDLCALC 155 (H) 05/25/2019   TRIG 174  (H) 05/25/2019   CHOLHDL 4.0 01/18/2006   Lab Results  Component Value Date   WBC 11.0 (H) 08/22/2019   HGB 12.8 08/22/2019   HCT 38.4 08/22/2019   MCV 88 08/22/2019   PLT 272 08/22/2019   Attestation Statements:   Reviewed by clinician on day of visit: allergies, medications, problem list, medical history, surgical history, family history, social history, and previous encounter notes.  I, Water quality scientist, CMA, am acting as Location manager for [employer], DO.  I have reviewed the above documentation for accuracy and completeness, and I agree with the above. Briscoe Deutscher, DO

## 2019-10-10 LAB — HEMOGLOBIN A1C
Est. average glucose Bld gHb Est-mCnc: 134 mg/dL
Hgb A1c MFr Bld: 6.3 % — ABNORMAL HIGH (ref 4.8–5.6)

## 2019-10-10 LAB — COMPREHENSIVE METABOLIC PANEL
ALT: 13 IU/L (ref 0–32)
AST: 20 IU/L (ref 0–40)
Albumin/Globulin Ratio: 1.7 (ref 1.2–2.2)
Albumin: 4.3 g/dL (ref 3.8–4.8)
Alkaline Phosphatase: 111 IU/L (ref 39–117)
BUN/Creatinine Ratio: 21 (ref 12–28)
BUN: 17 mg/dL (ref 8–27)
Bilirubin Total: 0.4 mg/dL (ref 0.0–1.2)
CO2: 24 mmol/L (ref 20–29)
Calcium: 9.6 mg/dL (ref 8.7–10.3)
Chloride: 98 mmol/L (ref 96–106)
Creatinine, Ser: 0.8 mg/dL (ref 0.57–1.00)
GFR calc Af Amer: 89 mL/min/{1.73_m2} (ref >59)
GFR calc non Af Amer: 78 mL/min/{1.73_m2} (ref >59)
Globulin, Total: 2.6 g/dL (ref 1.5–4.5)
Glucose: 109 mg/dL — ABNORMAL HIGH (ref 65–99)
Potassium: 4.4 mmol/L (ref 3.5–5.2)
Sodium: 140 mmol/L (ref 134–144)
Total Protein: 6.9 g/dL (ref 6.0–8.5)

## 2019-10-10 LAB — CBC WITH DIFFERENTIAL/PLATELET
Basophils Absolute: 0.1 10*3/uL (ref 0.0–0.2)
Basos: 1 %
EOS (ABSOLUTE): 0.1 10*3/uL (ref 0.0–0.4)
Eos: 1 %
Hematocrit: 39.9 % (ref 34.0–46.6)
Hemoglobin: 13.3 g/dL (ref 11.1–15.9)
Immature Grans (Abs): 0 10*3/uL (ref 0.0–0.1)
Immature Granulocytes: 0 %
Lymphocytes Absolute: 2.8 10*3/uL (ref 0.7–3.1)
Lymphs: 30 %
MCH: 28.8 pg (ref 26.6–33.0)
MCHC: 33.3 g/dL (ref 31.5–35.7)
MCV: 86 fL (ref 79–97)
Monocytes Absolute: 0.7 10*3/uL (ref 0.1–0.9)
Monocytes: 7 %
Neutrophils Absolute: 5.5 10*3/uL (ref 1.4–7.0)
Neutrophils: 61 %
Platelets: 211 10*3/uL (ref 150–450)
RBC: 4.62 x10E6/uL (ref 3.77–5.28)
RDW: 12.5 % (ref 11.7–15.4)
WBC: 9.2 10*3/uL (ref 3.4–10.8)

## 2019-10-10 LAB — VITAMIN D 25 HYDROXY (VIT D DEFICIENCY, FRACTURES): Vit D, 25-Hydroxy: 35.2 ng/mL (ref 30.0–100.0)

## 2019-10-10 LAB — LIPID PANEL
Chol/HDL Ratio: 5 ratio — ABNORMAL HIGH (ref 0.0–4.4)
Cholesterol, Total: 226 mg/dL — ABNORMAL HIGH (ref 100–199)
HDL: 45 mg/dL (ref >39)
LDL Chol Calc (NIH): 139 mg/dL — ABNORMAL HIGH (ref 0–99)
Triglycerides: 235 mg/dL — ABNORMAL HIGH (ref 0–149)
VLDL Cholesterol Cal: 42 mg/dL — ABNORMAL HIGH (ref 5–40)

## 2019-10-10 LAB — VITAMIN B12: Vitamin B-12: 543 pg/mL (ref 232–1245)

## 2019-10-30 ENCOUNTER — Encounter (INDEPENDENT_AMBULATORY_CARE_PROVIDER_SITE_OTHER): Payer: Self-pay | Admitting: Family Medicine

## 2019-10-30 ENCOUNTER — Ambulatory Visit (INDEPENDENT_AMBULATORY_CARE_PROVIDER_SITE_OTHER): Payer: Managed Care, Other (non HMO) | Admitting: Family Medicine

## 2019-10-30 ENCOUNTER — Other Ambulatory Visit: Payer: Self-pay

## 2019-10-30 VITALS — BP 136/62 | HR 76 | Temp 98.1°F | Ht 65.0 in | Wt 240.0 lb

## 2019-10-30 DIAGNOSIS — Z9189 Other specified personal risk factors, not elsewhere classified: Secondary | ICD-10-CM

## 2019-10-30 DIAGNOSIS — E1169 Type 2 diabetes mellitus with other specified complication: Secondary | ICD-10-CM | POA: Diagnosis not present

## 2019-10-30 DIAGNOSIS — E119 Type 2 diabetes mellitus without complications: Secondary | ICD-10-CM

## 2019-10-30 DIAGNOSIS — E559 Vitamin D deficiency, unspecified: Secondary | ICD-10-CM

## 2019-10-30 DIAGNOSIS — Z6839 Body mass index (BMI) 39.0-39.9, adult: Secondary | ICD-10-CM

## 2019-10-30 DIAGNOSIS — E1159 Type 2 diabetes mellitus with other circulatory complications: Secondary | ICD-10-CM

## 2019-10-30 DIAGNOSIS — F3289 Other specified depressive episodes: Secondary | ICD-10-CM

## 2019-10-30 DIAGNOSIS — I152 Hypertension secondary to endocrine disorders: Secondary | ICD-10-CM

## 2019-10-30 DIAGNOSIS — I10 Essential (primary) hypertension: Secondary | ICD-10-CM

## 2019-10-30 DIAGNOSIS — E785 Hyperlipidemia, unspecified: Secondary | ICD-10-CM

## 2019-10-30 MED ORDER — VITAMIN D (ERGOCALCIFEROL) 1.25 MG (50000 UNIT) PO CAPS: 50000.0000 [IU] | ORAL_CAPSULE | ORAL | 0 refills | Status: DC

## 2019-10-30 MED ORDER — LOSARTAN POTASSIUM 50 MG PO TABS: 50.0000 mg | ORAL_TABLET | Freq: Every day | ORAL | 0 refills | Status: DC

## 2019-10-30 MED ORDER — BUPROPION HCL ER (XL) 300 MG PO TB24: 300.0000 mg | ORAL_TABLET | Freq: Every day | ORAL | 0 refills | Status: DC

## 2019-10-30 NOTE — Progress Notes (Signed)
Chief Complaint:   OBESITY Gail Crawford is here to discuss her progress with her obesity treatment plan along with follow-up of her obesity related diagnoses. Gail Crawford is on the Category 2 Plan and states she is following her eating plan approximately 25% of the time. Gail Crawford states she is exercising for 0 minutes 0 times per week.  Today's visit was #: 25 Starting weight: 266 lbs Starting date: 09/05/2018 Today's weight: 240 lbs Today's date: 10/30/2019 Total lbs lost to date: 26 lbs Total lbs lost since last in-office visit: 0  Interim History: Gail Crawford says she struggles with breakfast.  She does not like eggs.  Her preference is a chicken biscuit.  She is up 2 pounds of water weight.  Subjective:   1. Type 2 diabetes mellitus without complication, without long-term current use of insulin (HCC) Her A1c has worsened since last check (6.3 up from 6.0).  She is taking metformin 500 mg twice daily.  Lab Results  Component Value Date   HGBA1C 6.3 (H) 10/09/2019   HGBA1C 6.0 (H) 05/25/2019   HGBA1C 5.8 (H) 01/03/2019   Lab Results  Component Value Date   LDLCALC 139 (H) 10/09/2019   CREATININE 0.80 10/09/2019   Lab Results  Component Value Date   INSULIN 8.6 05/25/2019   INSULIN 13.3 01/03/2019   INSULIN 15.8 09/05/2018    2. Hyperlipidemia associated with type 2 diabetes mellitus ([place]) Gail Crawford has hyperlipidemia and has been trying to improve her cholesterol levels with intensive lifestyle modification including a low saturated fat diet, exercise and weight loss. She denies any chest pain, claudication or myalgias.  She is not on a statin.  Cholesterol is above goal.  Lab Results  Component Value Date   ALT 13 10/09/2019   AST 20 10/09/2019   ALKPHOS 111 10/09/2019   BILITOT 0.4 10/09/2019   Lab Results  Component Value Date   CHOL 226 (H) 10/09/2019   HDL 45 10/09/2019   LDLCALC 139 (H) 10/09/2019   TRIG 235 (H) 10/09/2019   CHOLHDL 5.0 (H) 10/09/2019    3. Hypertension associated with diabetes ([place]) Review: taking medications as instructed, no medication side effects noted, no chest pain on exertion, no dyspnea on exertion, no swelling of ankles.  She is taking Cozaar and chlorthalidone.  Blood pressure is at goal.   BP Readings from Last 3 Encounters:  10/30/19 136/62  10/09/19 (!) 154/85  09/21/19 (!) 122/58   4. Vitamin D deficiency Octivia's Vitamin D level was 35.2 on 10/09/2019. She is currently taking vit D. She denies nausea, vomiting or muscle weakness.  5. Other depression, with emotinal eating Gail Crawford is struggling with emotional eating and using food for comfort to the extent that it is negatively impacting her health. She has been working on behavior modification techniques to help reduce her emotional eating and has been unsuccessful. She shows no sign of suicidal or homicidal ideations.  She is takig Wellbutrin 150 SR daily.  She is tolerating this and wants to increase.  6. At risk for heart disease Gail Crawford is at a higher than average risk for cardiovascular disease due to obesity. Reviewed: no chest pain on exertion, no dyspnea on exertion, and no swelling of ankles.  Assessment/Plan:   1. Type 2 diabetes mellitus without complication, without long-term current use of insulin (HCC) Good blood sugar control is important to decrease the likelihood of diabetic complications such as nephropathy, neuropathy, limb loss, blindness, coronary artery disease, and death. Intensive lifestyle modification including diet,  exercise and weight loss are the first line of treatment for diabetes.   2. Hyperlipidemia associated with type 2 diabetes mellitus ([place]) Cardiovascular risk and specific lipid/LDL goals reviewed.  We discussed several lifestyle modifications today and Bridgit will continue to work on diet, exercise and weight loss efforts. Orders and follow up as documented in patient record.  I offered the lipid clinic.  The  patient said she will think about it.  Counseling Intensive lifestyle modifications are the first line treatment for this issue. . Dietary changes: Increase soluble fiber. Decrease simple carbohydrates. . Exercise changes: Moderate to vigorous-intensity aerobic activity 150 minutes per week if tolerated. . Lipid-lowering medications: see documented in medical record.  3. Hypertension associated with diabetes ([place]) Gail Crawford is working on healthy weight loss and exercise to improve blood pressure control. We will watch for signs of hypotension as she continues her lifestyle modifications.  4. Vitamin D deficiency Low Vitamin D level contributes to fatigue and are associated with obesity, breast, and colon cancer. She agrees to continue to take prescription Vitamin D @50 ,000 IU every week and will follow-up for routine testing of Vitamin D, at least 2-3 times per year to avoid over-replacement.  Orders - Vitamin D, Ergocalciferol, (DRISDOL) 1.25 MG (50000 UNIT) CAPS capsule; Take 1 capsule (50,000 Units total) by mouth every 7 (seven) days.  Dispense: 4 capsule; Refill: 0  5. Other depression, with emotinal eating Behavior modification techniques were discussed today to help Gail Crawford deal with her emotional/non-hunger eating behaviors.  Orders and follow up as documented in patient record.  Will increase Wellbutrin, as below.  Orders - buPROPion (WELLBUTRIN XL) 300 MG 24 hr tablet; Take 1 tablet (300 mg total) by mouth daily.  Dispense: 30 tablet; Refill: 0  6. At risk for heart disease Gail Crawford was given approximately 15 minutes of coronary artery disease prevention counseling today. She is 64 y.o. female and has risk factors for heart disease including obesity. We discussed intensive lifestyle modifications today with an emphasis on specific weight loss instructions and strategies.   Repetitive spaced learning was employed today to elicit superior memory formation and behavioral  change.  7. Class 2 severe obesity with serious comorbidity and body mass index (BMI) of 39.0 to 39.9 in adult, unspecified obesity type [facility]) Gail Crawford is currently in the action stage of change. As such, her goal is to continue with weight loss efforts. She has agreed to the Category 2 Plan.   Exercise goals: All adults should avoid inactivity. Some physical activity is better than none, and adults who participate in any amount of physical activity gain some health benefits.  Behavioral modification strategies: increasing lean protein intake and increasing water intake.  Other options:  Frozen chicken biscuit and fruit, peanut butter with banana, toast.  Otto has agreed to follow-up with our clinic in 2 weeks. She was informed of the importance of frequent follow-up visits to maximize her success with intensive lifestyle modifications for her multiple health conditions.   Objective:   Blood pressure 136/62, pulse 76, temperature 98.1 F (36.7 C), temperature source Oral, height 5\' 5"  (1.651 m), weight 240 lb (108.9 kg), SpO2 98 %. Body mass index is 39.94 kg/m.  General: Cooperative, alert, well developed, in no acute distress. HEENT: Conjunctivae and lids unremarkable. Cardiovascular: Regular rhythm.  Lungs: Normal work of breathing. Neurologic: No focal deficits.   Lab Results  Component Value Date   CREATININE 0.80 10/09/2019   BUN 17 10/09/2019   NA 140 10/09/2019  K 4.4 10/09/2019   CL 98 10/09/2019   CO2 24 10/09/2019   Lab Results  Component Value Date   ALT 13 10/09/2019   AST 20 10/09/2019   ALKPHOS 111 10/09/2019   BILITOT 0.4 10/09/2019   Lab Results  Component Value Date   HGBA1C 6.3 (H) 10/09/2019   HGBA1C 6.0 (H) 05/25/2019   HGBA1C 5.8 (H) 01/03/2019   HGBA1C 7.1 (H) 09/05/2018   Lab Results  Component Value Date   INSULIN 8.6 05/25/2019   INSULIN 13.3 01/03/2019   INSULIN 15.8 09/05/2018   Lab Results  Component Value Date   TSH 1.920  09/05/2018   Lab Results  Component Value Date   CHOL 226 (H) 10/09/2019   HDL 45 10/09/2019   LDLCALC 139 (H) 10/09/2019   TRIG 235 (H) 10/09/2019   CHOLHDL 5.0 (H) 10/09/2019   Lab Results  Component Value Date   WBC 9.2 10/09/2019   HGB 13.3 10/09/2019   HCT 39.9 10/09/2019   MCV 86 10/09/2019   PLT 211 10/09/2019   Attestation Statements:   Reviewed by clinician on day of visit: allergies, medications, problem list, medical history, surgical history, family history, social history, and previous encounter notes.  I, Water quality scientist, CMA, am acting as Location manager for [employer], DO.  I have reviewed the above documentation for accuracy and completeness, and I agree with the above. Briscoe Deutscher, DO

## 2019-11-13 ENCOUNTER — Ambulatory Visit: Payer: Managed Care, Other (non HMO) | Admitting: Internal Medicine

## 2019-11-15 ENCOUNTER — Ambulatory Visit (INDEPENDENT_AMBULATORY_CARE_PROVIDER_SITE_OTHER): Payer: Managed Care, Other (non HMO) | Admitting: Family Medicine

## 2019-11-15 ENCOUNTER — Other Ambulatory Visit: Payer: Self-pay

## 2019-11-15 ENCOUNTER — Encounter (INDEPENDENT_AMBULATORY_CARE_PROVIDER_SITE_OTHER): Payer: Self-pay | Admitting: Family Medicine

## 2019-11-15 VITALS — BP 138/83 | HR 74 | Temp 98.6°F | Ht 65.0 in | Wt 241.0 lb

## 2019-11-15 DIAGNOSIS — E559 Vitamin D deficiency, unspecified: Secondary | ICD-10-CM | POA: Diagnosis not present

## 2019-11-15 DIAGNOSIS — Z9189 Other specified personal risk factors, not elsewhere classified: Secondary | ICD-10-CM | POA: Diagnosis not present

## 2019-11-15 DIAGNOSIS — I10 Essential (primary) hypertension: Secondary | ICD-10-CM

## 2019-11-15 DIAGNOSIS — E1169 Type 2 diabetes mellitus with other specified complication: Secondary | ICD-10-CM

## 2019-11-15 DIAGNOSIS — F3289 Other specified depressive episodes: Secondary | ICD-10-CM | POA: Diagnosis not present

## 2019-11-15 DIAGNOSIS — E785 Hyperlipidemia, unspecified: Secondary | ICD-10-CM

## 2019-11-15 DIAGNOSIS — E66813 Obesity, class 3: Secondary | ICD-10-CM

## 2019-11-15 DIAGNOSIS — Z6841 Body Mass Index (BMI) 40.0 and over, adult: Secondary | ICD-10-CM

## 2019-11-15 MED ORDER — RYBELSUS 3 MG PO TABS: 1.0000 | ORAL_TABLET | Freq: Every day | ORAL | 0 refills | Status: DC

## 2019-11-15 MED ORDER — CHLORTHALIDONE 25 MG PO TABS: 25.0000 mg | ORAL_TABLET | Freq: Every day | ORAL | 0 refills | Status: DC

## 2019-11-15 MED ORDER — VITAMIN D (ERGOCALCIFEROL) 1.25 MG (50000 UNIT) PO CAPS: 50000.0000 [IU] | ORAL_CAPSULE | ORAL | 0 refills | Status: DC

## 2019-11-15 MED ORDER — LOSARTAN POTASSIUM 50 MG PO TABS: 50.0000 mg | ORAL_TABLET | Freq: Every day | ORAL | 0 refills | Status: DC

## 2019-11-15 NOTE — Progress Notes (Signed)
Chief Complaint:   OBESITY Gail Crawford is here to discuss her progress with her obesity treatment plan along with follow-up of her obesity related diagnoses. Gail Crawford is on the Category 2 Plan and states she is following her eating plan approximately 25% of the time. Gail Crawford states she is exercising for 0 minutes 0 times per week.  Today's visit was #: 58 Starting weight: 266 lbs Starting date: 09/05/2018 Today's weight: 241 lbs Today's date: 11/15/2019 Total lbs lost to date: 25 lbs Total lbs lost since last in-office visit: 0  Interim History: Gail Crawford says she is struggling between 2-5 pm.  Subjective:   1. Type 2 diabetes mellitus with other specified complication, without long-term current use of insulin (HCC) Medications reviewed. Gail Crawford is currently taking metformin 500 mg daily.  Lab Results  Component Value Date   HGBA1C 6.3 (H) 10/09/2019   HGBA1C 6.0 (H) 05/25/2019   HGBA1C 5.8 (H) 01/03/2019   Lab Results  Component Value Date   LDLCALC 139 (H) 10/09/2019   CREATININE 0.80 10/09/2019   Lab Results  Component Value Date   INSULIN 8.6 05/25/2019   INSULIN 13.3 01/03/2019   INSULIN 15.8 09/05/2018   2. Essential hypertension Review: taking medications as instructed, no medication side effects noted, no chest pain on exertion, no dyspnea on exertion, no swelling of ankles. Gail Crawford is taking Cozaar and chlorthalidone for blood pressure control.  BP Readings from Last 3 Encounters:  11/15/19 138/83  10/30/19 136/62  10/09/19 (!) 154/85   3. Hyperlipidemia associated with type 2 diabetes mellitus (Gail Crawford) Gail Crawford has hyperlipidemia and has been trying to improve her cholesterol levels with intensive lifestyle modification including a low saturated fat diet, exercise and weight loss. She denies any chest pain, claudication or myalgias.    Lab Results  Component Value Date   ALT 13 10/09/2019   AST 20 10/09/2019   ALKPHOS 111 10/09/2019   BILITOT 0.4  10/09/2019   Lab Results  Component Value Date   CHOL 226 (H) 10/09/2019   HDL 45 10/09/2019   LDLCALC 139 (H) 10/09/2019   TRIG 235 (H) 10/09/2019   CHOLHDL 5.0 (H) 10/09/2019   4. Vitamin D deficiency Gail Crawford's Vitamin D level was 35.2 on 10/09/2019. She is currently taking vit D. She denies nausea, vomiting or muscle weakness.  5. Other depression, with emotinal eating Gail Crawford is struggling with emotional eating and using food for comfort to the extent that it is negatively impacting her health. She has been working on behavior modification techniques to help reduce her emotional eating and has been unsuccessful. She shows no sign of suicidal or homicidal ideations.  She is taking Wellbutrin 300 mg daily.  Assessment/Plan:   1. Type 2 diabetes mellitus with other specified complication, without long-term current use of insulin (HCC) Good blood sugar control is important to decrease the likelihood of diabetic complications such as nephropathy, neuropathy, limb loss, blindness, coronary artery disease, and death. Intensive lifestyle modification including diet, exercise and weight loss are the first line of treatment for diabetes.   Orders - Semaglutide (RYBELSUS) 3 MG TABS; Take 1 tablet by mouth daily.  Dispense: 30 tablet; Refill: 0  2. Essential hypertension Gail Crawford is working on healthy weight loss and exercise to improve blood pressure control. We will watch for signs of hypotension as she continues her lifestyle modifications.  Orders - losartan (COZAAR) 50 MG tablet; Take 1 tablet (50 mg total) by mouth daily.  Dispense: 30 tablet; Refill: 0 - chlorthalidone (HYGROTON)  25 MG tablet; Take 1 tablet (25 mg total) by mouth daily.  Dispense: 30 tablet; Refill: 0  3. Hyperlipidemia associated with type 2 diabetes mellitus (Gail Crawford) Cardiovascular risk and specific lipid/LDL goals reviewed.  We discussed several lifestyle modifications today and Gail Crawford will continue to work on  diet, exercise and weight loss efforts. Orders and follow up as documented in patient record.   Counseling Intensive lifestyle modifications are the first line treatment for this issue. . Dietary changes: Increase soluble fiber. Decrease simple carbohydrates. . Exercise changes: Moderate to vigorous-intensity aerobic activity 150 minutes per week if tolerated. . Lipid-lowering medications: see documented in medical record.  4. Vitamin D deficiency Low Vitamin D level contributes to fatigue and are associated with obesity, breast, and colon cancer. She agrees to continue to take prescription Vitamin D @50 ,000 IU every week and will follow-up for routine testing of Vitamin D, at least 2-3 times per year to avoid over-replacement.  Orders - Vitamin D, Ergocalciferol, (DRISDOL) 1.25 MG (50000 UNIT) CAPS capsule; Take 1 capsule (50,000 Units total) by mouth every 7 (seven) days.  Dispense: 4 capsule; Refill: 0  5. Other depression, with emotinal eating Behavior modification techniques were discussed today to help Gail Crawford deal with her emotional/non-hunger eating behaviors.  Orders and follow up as documented in patient record.   6. At risk for heart disease Gail Crawford was given approximately 15 minutes of coronary artery disease prevention counseling today. She is 64 y.o. female and has risk factors for heart disease including obesity. We discussed intensive lifestyle modifications today with an emphasis on specific weight loss instructions and strategies.   The 10-year ASCVD risk score Gail Bussing DC Brooke Bonito., Gail al., Gail Crawford) is: 18.4%   Values used to calculate the score:     Age: 7 years     Sex: Female     Is Non-Hispanic African American: No     Diabetic: Yes     Tobacco smoker: No     Systolic Blood Pressure: 0000000 mmHg     Is BP treated: Yes     HDL Cholesterol: 45 mg/dL     Total Cholesterol: 226 mg/dL  Repetitive spaced learning was employed today to elicit superior memory formation and  behavioral change.  7. Class 3 severe obesity with serious comorbidity and body mass index (BMI) of 40.0 to 44.9 in adult, unspecified obesity type Gail Crawford) Gail Crawford is currently in the action stage of change. As such, her goal is to continue with weight loss efforts. She has agreed to the Category 2 Plan.   Exercise goals: No exercise has been prescribed at this time.  Behavioral modification strategies: increasing lean protein intake and increasing water intake.  Gail Crawford has agreed to follow-up with our clinic in 2 weeks. She was informed of the importance of frequent follow-up visits to maximize her success with intensive lifestyle modifications for her multiple health conditions.   Objective:   Blood pressure 138/83, pulse 74, temperature 98.6 F (37 C), temperature source Oral, height 5\' 5"  (1.651 m), weight 241 lb (109.3 kg), SpO2 100 %. Body mass index is 40.1 kg/m.  General: Cooperative, alert, well developed, in no acute distress. HEENT: Conjunctivae and lids unremarkable. Cardiovascular: Regular rhythm.  Lungs: Normal work of breathing. Neurologic: No focal deficits.   Lab Results  Component Value Date   CREATININE 0.80 10/09/2019   BUN 17 10/09/2019   NA 140 10/09/2019   K 4.4 10/09/2019   CL 98 10/09/2019   CO2 24 10/09/2019   Lab  Results  Component Value Date   ALT 13 10/09/2019   AST 20 10/09/2019   ALKPHOS 111 10/09/2019   BILITOT 0.4 10/09/2019   Lab Results  Component Value Date   HGBA1C 6.3 (H) 10/09/2019   HGBA1C 6.0 (H) 05/25/2019   HGBA1C 5.8 (H) 01/03/2019   HGBA1C 7.1 (H) 09/05/2018   Lab Results  Component Value Date   INSULIN 8.6 05/25/2019   INSULIN 13.3 01/03/2019   INSULIN 15.8 09/05/2018   Lab Results  Component Value Date   TSH 1.920 09/05/2018   Lab Results  Component Value Date   CHOL 226 (H) 10/09/2019   HDL 45 10/09/2019   LDLCALC 139 (H) 10/09/2019   TRIG 235 (H) 10/09/2019   CHOLHDL 5.0 (H) 10/09/2019   Lab Results    Component Value Date   WBC 9.2 10/09/2019   HGB 13.3 10/09/2019   HCT 39.9 10/09/2019   MCV 86 10/09/2019   PLT 211 10/09/2019   Attestation Statements:   Reviewed by clinician on day of visit: allergies, medications, problem list, medical history, surgical history, family history, social history, and previous encounter notes.  I, Water quality scientist, CMA, am acting as Location manager for [employer], DO.  I have reviewed the above documentation for accuracy and completeness, and I agree with the above. Briscoe Deutscher, DO

## 2019-12-05 ENCOUNTER — Encounter (INDEPENDENT_AMBULATORY_CARE_PROVIDER_SITE_OTHER): Payer: Self-pay | Admitting: Family Medicine

## 2019-12-06 ENCOUNTER — Other Ambulatory Visit (INDEPENDENT_AMBULATORY_CARE_PROVIDER_SITE_OTHER): Payer: Self-pay | Admitting: *Deleted

## 2019-12-06 DIAGNOSIS — F3289 Other specified depressive episodes: Secondary | ICD-10-CM

## 2019-12-06 MED ORDER — BUPROPION HCL ER (XL) 300 MG PO TB24: 300.0000 mg | ORAL_TABLET | Freq: Every day | ORAL | 0 refills | Status: DC

## 2019-12-13 ENCOUNTER — Ambulatory Visit (INDEPENDENT_AMBULATORY_CARE_PROVIDER_SITE_OTHER): Payer: Managed Care, Other (non HMO) | Admitting: Family Medicine

## 2019-12-13 ENCOUNTER — Encounter (INDEPENDENT_AMBULATORY_CARE_PROVIDER_SITE_OTHER): Payer: Self-pay | Admitting: Family Medicine

## 2019-12-13 ENCOUNTER — Other Ambulatory Visit: Payer: Self-pay

## 2019-12-13 VITALS — BP 142/80 | HR 73 | Temp 98.1°F | Ht 65.0 in | Wt 239.0 lb

## 2019-12-13 DIAGNOSIS — G8929 Other chronic pain: Secondary | ICD-10-CM

## 2019-12-13 DIAGNOSIS — K0889 Other specified disorders of teeth and supporting structures: Secondary | ICD-10-CM

## 2019-12-13 DIAGNOSIS — E785 Hyperlipidemia, unspecified: Secondary | ICD-10-CM

## 2019-12-13 DIAGNOSIS — F3289 Other specified depressive episodes: Secondary | ICD-10-CM

## 2019-12-13 DIAGNOSIS — E1159 Type 2 diabetes mellitus with other circulatory complications: Secondary | ICD-10-CM | POA: Diagnosis not present

## 2019-12-13 DIAGNOSIS — M791 Myalgia, unspecified site: Secondary | ICD-10-CM | POA: Insufficient documentation

## 2019-12-13 DIAGNOSIS — Z9189 Other specified personal risk factors, not elsewhere classified: Secondary | ICD-10-CM | POA: Diagnosis not present

## 2019-12-13 DIAGNOSIS — E559 Vitamin D deficiency, unspecified: Secondary | ICD-10-CM | POA: Diagnosis not present

## 2019-12-13 DIAGNOSIS — E1169 Type 2 diabetes mellitus with other specified complication: Secondary | ICD-10-CM

## 2019-12-13 DIAGNOSIS — I10 Essential (primary) hypertension: Secondary | ICD-10-CM

## 2019-12-13 DIAGNOSIS — T466X5A Adverse effect of antihyperlipidemic and antiarteriosclerotic drugs, initial encounter: Secondary | ICD-10-CM | POA: Insufficient documentation

## 2019-12-13 DIAGNOSIS — Z6839 Body mass index (BMI) 39.0-39.9, adult: Secondary | ICD-10-CM

## 2019-12-13 DIAGNOSIS — M25561 Pain in right knee: Secondary | ICD-10-CM | POA: Diagnosis not present

## 2019-12-13 DIAGNOSIS — I152 Hypertension secondary to endocrine disorders: Secondary | ICD-10-CM

## 2019-12-13 MED ORDER — BUPROPION HCL ER (XL) 300 MG PO TB24: 300.0000 mg | ORAL_TABLET | Freq: Every day | ORAL | 0 refills | Status: DC

## 2019-12-13 MED ORDER — LOSARTAN POTASSIUM 50 MG PO TABS: 50.0000 mg | ORAL_TABLET | Freq: Every day | ORAL | 0 refills | Status: DC

## 2019-12-13 MED ORDER — METFORMIN HCL 500 MG PO TABS: 500.0000 mg | ORAL_TABLET | Freq: Two times a day (BID) | ORAL | 0 refills | Status: DC

## 2019-12-13 MED ORDER — VITAMIN D (ERGOCALCIFEROL) 1.25 MG (50000 UNIT) PO CAPS: 50000.0000 [IU] | ORAL_CAPSULE | ORAL | 0 refills | Status: DC

## 2019-12-13 MED ORDER — CHLORTHALIDONE 25 MG PO TABS: 25.0000 mg | ORAL_TABLET | Freq: Every day | ORAL | 0 refills | Status: DC

## 2019-12-13 NOTE — Progress Notes (Signed)
Chief Complaint:   OBESITY Gail Crawford is here to discuss her progress with her obesity treatment plan along with follow-up of her obesity related diagnoses. Gail Crawford is on the Category 2 Plan and states she is following her eating plan approximately 25% of the time. Gail Crawford states she is doing water exercises at the [facility] for 40 minutes 3 times per week.  Today's visit was #: 72 Starting weight: 266 lbs Starting date: 09/05/2018 Today's weight: 239 lbs Today's date: 12/13/2019 Total lbs lost to date: 27 lbs Total lbs lost since last in-office visit: 2 lbs  Interim History: Gail Crawford reports that Rybelsus was too expensive.  She is taking metformin 500 mg in the morning.  She needs 90-day refills on her medications for insurance purposes.  She works in a [employer] and says that all the kids coming back to school has increased her stress level.  She is looking forward to summer.  She has an appointment with her [facility] tomorrow for CPE.  She reports drinking 32 ounces of water per day.  Her sleeping has been interrupted lately.  She says she has been waking and worrying.  Subjective:   1. Hypertension associated with diabetes ([place]) Review: taking medications as instructed, no medication side effects noted, no chest pain on exertion, no dyspnea on exertion, no swelling of ankles.  She is taking chlorthalidone and losartan for blood pressure control.  BP Readings from Last 3 Encounters:  12/13/19 (!) 142/80  11/15/19 138/83  10/30/19 136/62   2. Type 2 diabetes mellitus with other specified complication, without long-term current use of insulin ([place]) Gail Crawford endorses polyphagia.  Medications reviewed. Diabetic ROS: no polyuria or polydipsia, no chest pain, dyspnea or TIA's, no numbness, tingling or pain in extremities.   Lab Results  Component Value Date   HGBA1C 6.3 (H) 10/09/2019   HGBA1C 6.0 (H) 05/25/2019   HGBA1C 5.8 (H) 01/03/2019   Lab Results  Component Value Date   LDLCALC 139 (H) 10/09/2019   CREATININE 0.80 10/09/2019   Lab Results  Component Value Date   INSULIN 8.6 05/25/2019   INSULIN 13.3 01/03/2019   INSULIN 15.8 09/05/2018   3. Hyperlipidemia associated with type 2 diabetes mellitus ([place]), goal < 70, with statin-induced myalgias, Lipid Clinic has been offered  Gail Crawford has hyperlipidemia and has been trying to improve her cholesterol levels with intensive lifestyle modification including a low saturated fat diet, exercise and weight loss. She denies any chest pain, claudication or myalgias.  She is not on a statin.  Lab Results  Component Value Date   ALT 13 10/09/2019   AST 20 10/09/2019   ALKPHOS 111 10/09/2019   BILITOT 0.4 10/09/2019   Lab Results  Component Value Date   CHOL 226 (H) 10/09/2019   HDL 45 10/09/2019   LDLCALC 139 (H) 10/09/2019   TRIG 235 (H) 10/09/2019   CHOLHDL 5.0 (H) 10/09/2019   4. Vitamin D deficiency Gail Crawford's Vitamin D level was 35.2 on 10/09/2019. She is currently taking prescription vitamin D 50,000 IU each week. She denies nausea, vomiting or muscle weakness.  5. Chronic pain of right knee Gail Crawford is taking diclofenac for knee pain.  She is followed by [facility].  6. Dentalgia She had a cyst on her upper gum line and had dental surgery last week.  She is currently taking antibiotics.   7. Other depression, with emotinal eating Gail Crawford is struggling with emotional eating and using food for comfort to the extent that it is negatively  impacting her health. She has been working on behavior modification techniques to help reduce her emotional eating and has been successful. She shows no sign of suicidal or homicidal ideations.  She is taking Wellbutrin 300 mg daily.  8. At risk for heart disease The 10-year ASCVD risk score Gail Bussing DC Brooke Bonito., et al., 2013) is: 19.4%   Values used to calculate the score:     Age: 64 years     Sex: Female     Is Non-Hispanic African American: No     Diabetic: Yes      Tobacco smoker: No     Systolic Blood Pressure: A999333 mmHg     Is BP treated: Yes     HDL Cholesterol: 45 mg/dL     Total Cholesterol: 226 mg/dL  Assessment/Plan:   1. Hypertension associated with diabetes ([place]) Gail Crawford is working on healthy weight loss and exercise to improve blood pressure control. We will watch for signs of hypotension as she continues her lifestyle modifications.  Orders - chlorthalidone (HYGROTON) 25 MG tablet; Take 1 tablet (25 mg total) by mouth daily.  Dispense: 90 tablet; Refill: 0 - losartan (COZAAR) 50 MG tablet; Take 1 tablet (50 mg total) by mouth daily.  Dispense: 90 tablet; Refill: 0  2. Type 2 diabetes mellitus with other specified complication, without long-term current use of insulin (HCC) Good blood sugar control is important to decrease the likelihood of diabetic complications such as nephropathy, neuropathy, limb loss, blindness, coronary artery disease, and death. Intensive lifestyle modification including diet, exercise and weight loss are the first line of treatment for diabetes.  We discussed Ozempic.  Will increase metformin to twice daily dosing.  Orders - metFORMIN (GLUCOPHAGE) 500 MG tablet; Take 1 tablet (500 mg total) by mouth 2 (two) times daily.  Dispense: 180 tablet; Refill: 0  3. Hyperlipidemia associated with type 2 diabetes mellitus ([place]), goal < 70, with statin-induced myalgias, Lipid Clinic has been offered  We reviewed the lipid clinic option.  She is open to trying after another 3 months.  Cardiovascular risk and specific lipid/LDL goals reviewed.  We discussed several lifestyle modifications today and Gail Crawford will continue to work on diet, exercise and weight loss efforts. Orders and follow up as documented in patient record.   Counseling Intensive lifestyle modifications are the first line treatment for this issue. . Dietary changes: Increase soluble fiber. Decrease simple carbohydrates. . Exercise changes: Moderate to  vigorous-intensity aerobic activity 150 minutes per week if tolerated. . Lipid-lowering medications: see documented in medical record.  4. Vitamin D deficiency Low Vitamin D level contributes to fatigue and are associated with obesity, breast, and colon cancer. She agrees to continue to take prescription Vitamin D @50 ,000 IU every week and will follow-up for routine testing of Vitamin D, at least 2-3 times per year to avoid over-replacement.  Orders - Vitamin D, Ergocalciferol, (DRISDOL) 1.25 MG (50000 UNIT) CAPS capsule; Take 1 capsule (50,000 Units total) by mouth every 7 (seven) days.  Dispense: 12 capsule; Refill: 0  5. Chronic pain of right knee Will follow because mobility and pain control are important for weight management.  6. Dentalgia We will continue to monitor..  7. Other depression, with emotinal eating Behavior modification techniques were discussed today to help Gail Crawford deal with her emotional/non-hunger eating behaviors.  Orders and follow up as documented in patient record.   Orders - buPROPion (WELLBUTRIN XL) 300 MG 24 hr tablet; Take 1 tablet (300 mg total) by mouth daily.  Dispense: 90  tablet; Refill: 0  8. At risk for heart disease Gail Crawford was given approximately 15 minutes of coronary artery disease prevention counseling today. She is 63 y.o. female and has risk factors for heart disease including obesity. We discussed intensive lifestyle modifications today with an emphasis on specific weight loss instructions and strategies.   Repetitive spaced learning was employed today to elicit superior memory formation and behavioral change.  9. Class 2 severe obesity with serious comorbidity and body mass index (BMI) of 39.0 to 39.9 in adult, unspecified obesity type Gail Crawford) Gail Crawford is currently in the action stage of change. As such, her goal is to continue with weight loss efforts. She has agreed to the Category 2 Plan.   Exercise goals: As is.  Behavioral  modification strategies: increasing lean protein intake and increasing water intake.  Gail Crawford has agreed to follow-up with our clinic in 2-4 weeks. She was informed of the importance of frequent follow-up visits to maximize her success with intensive lifestyle modifications for her multiple health conditions.   Objective:   Blood pressure (!) 142/80, pulse 73, temperature 98.1 F (36.7 C), temperature source Oral, height 5\' 5"  (1.651 m), weight 239 lb (108.4 kg), SpO2 99 %. Body mass index is 39.77 kg/m.  General: Cooperative, alert, well developed, in no acute distress. HEENT: Conjunctivae and lids unremarkable. Cardiovascular: Regular rhythm.  Lungs: Normal work of breathing. Neurologic: No focal deficits.   Lab Results  Component Value Date   CREATININE 0.80 10/09/2019   BUN 17 10/09/2019   NA 140 10/09/2019   K 4.4 10/09/2019   CL 98 10/09/2019   CO2 24 10/09/2019   Lab Results  Component Value Date   ALT 13 10/09/2019   AST 20 10/09/2019   ALKPHOS 111 10/09/2019   BILITOT 0.4 10/09/2019   Lab Results  Component Value Date   HGBA1C 6.3 (H) 10/09/2019   HGBA1C 6.0 (H) 05/25/2019   HGBA1C 5.8 (H) 01/03/2019   HGBA1C 7.1 (H) 09/05/2018   Lab Results  Component Value Date   INSULIN 8.6 05/25/2019   INSULIN 13.3 01/03/2019   INSULIN 15.8 09/05/2018   Lab Results  Component Value Date   TSH 1.920 09/05/2018   Lab Results  Component Value Date   CHOL 226 (H) 10/09/2019   HDL 45 10/09/2019   LDLCALC 139 (H) 10/09/2019   TRIG 235 (H) 10/09/2019   CHOLHDL 5.0 (H) 10/09/2019   Lab Results  Component Value Date   WBC 9.2 10/09/2019   HGB 13.3 10/09/2019   HCT 39.9 10/09/2019   MCV 86 10/09/2019   PLT 211 10/09/2019   Attestation Statements:   Reviewed by clinician on day of visit: allergies, medications, problem list, medical history, surgical history, family history, social history, and previous encounter notes.  I, Water quality scientist, CMA, am acting as  Location manager for [employer], DO.  I have reviewed the above documentation for accuracy and completeness, and I agree with the above. Briscoe Deutscher, DO

## 2020-01-08 ENCOUNTER — Ambulatory Visit (INDEPENDENT_AMBULATORY_CARE_PROVIDER_SITE_OTHER): Payer: Managed Care, Other (non HMO) | Admitting: Family Medicine

## 2020-01-08 ENCOUNTER — Encounter (INDEPENDENT_AMBULATORY_CARE_PROVIDER_SITE_OTHER): Payer: Self-pay | Admitting: Family Medicine

## 2020-01-08 ENCOUNTER — Other Ambulatory Visit: Payer: Self-pay

## 2020-01-08 VITALS — BP 126/75 | HR 68 | Temp 98.3°F | Ht 65.0 in | Wt 240.0 lb

## 2020-01-08 DIAGNOSIS — Z6841 Body Mass Index (BMI) 40.0 and over, adult: Secondary | ICD-10-CM

## 2020-01-08 DIAGNOSIS — E8881 Metabolic syndrome: Secondary | ICD-10-CM

## 2020-01-08 DIAGNOSIS — E1169 Type 2 diabetes mellitus with other specified complication: Secondary | ICD-10-CM

## 2020-01-08 DIAGNOSIS — K099 Cyst of oral region, unspecified: Secondary | ICD-10-CM | POA: Diagnosis not present

## 2020-01-08 DIAGNOSIS — Z9189 Other specified personal risk factors, not elsewhere classified: Secondary | ICD-10-CM

## 2020-01-09 MED ORDER — TRULICITY 0.75 MG/0.5ML ~~LOC~~ SOAJ: 0.7500 mg | SUBCUTANEOUS | 0 refills | Status: DC

## 2020-01-09 NOTE — Progress Notes (Signed)
Chief Complaint:   OBESITY Gail Crawford is here to discuss her progress with her obesity treatment plan along with follow-up of her obesity related diagnoses. Gail Crawford is on the Category 2 Plan and states she is following her eating plan approximately 25% of the time. Gail Crawford states she is walking and doing water aerobics for 40 minutes 3 times per week.  Today's visit was #: 28 Starting weight: 266 lbs Starting date: 09/05/2018 Today's weight: 240 lbs Today's date: 01/08/2020 Total lbs lost to date: 26 lbs Total lbs lost since last in-office visit: 0  Interim History: Gail Crawford is status post dental/gum surgery and is still not eating much protein.  She has done some celebration eating.  The last day of school is the last day of June, and she says she is very excited for summer break.  Subjective:   1. Type 2 diabetes mellitus with other specified complication, without long-term current use of insulin (HCC) Medications reviewed. Diabetic ROS: no polyuria or polydipsia, no chest pain, dyspnea or TIA's, no numbness, tingling or pain in extremities.   Lab Results  Component Value Date   HGBA1C 6.3 (H) 10/09/2019   HGBA1C 6.0 (H) 05/25/2019   HGBA1C 5.8 (H) 01/03/2019   Lab Results  Component Value Date   LDLCALC 139 (H) 10/09/2019   CREATININE 0.80 10/09/2019   Lab Results  Component Value Date   INSULIN 8.6 05/25/2019   INSULIN 13.3 01/03/2019   INSULIN 15.8 09/05/2018   2. Cyst of oral region Status post removal.  Still healing.  Inhibiting adherence to nutrition goals.  Assessment/Plan:   1. Type 2 diabetes mellitus with other specified complication, without long-term current use of insulin (HCC) Good blood sugar control is important to decrease the likelihood of diabetic complications such as nephropathy, neuropathy, limb loss, blindness, coronary artery disease, and death. Intensive lifestyle modification including diet, exercise and weight loss are the first line  of treatment for diabetes.  Will start on Trulicity and refer to Diabetic Education.  Orders - Dulaglutide (TRULICITY) [id number] [id number] SOPN; Inject 0.75 mg into the skin once a week.  Dispense: 4 pen; Refill: 0  2. Cyst of oral region Will continue to monitor.  3. Metabolic syndrome Counseling: Intensive lifestyle modifications are the first line treatment for this issue. We discussed several lifestyle modifications today and she will continue to work on diet, exercise and weight loss efforts. We will continue to monitor. Orders and follow up as documented in patient record.  Counseling  Metabolic syndrome is a combination of conditions such as abdominal obesity, high blood sugar, high blood pressure, and unhealthy levels of cholesterol. Metabolic syndrome raises your risk of developing heart disease, diabetes, and stroke.  Following a heart-healthy diet can help you lower your risk of heart disease and improve metabolic syndrome. This diet includes lean proteins, healthy fats, nuts, and plenty of fruits and vegetables.  Along with regular exercise, following a healthy diet can help your body use insulin better and help you lose weight.  A common goal for people with metabolic syndrome is to lose 7-10% of their starting weight.  4. At risk for heart disease Gail Crawford was given approximately 15 minutes of coronary artery disease prevention counseling today. She is 64 y.o. female and has risk factors for heart disease including obesity. We discussed intensive lifestyle modifications today with an emphasis on specific weight loss instructions and strategies.   Repetitive spaced learning was employed today to elicit superior memory formation and behavioral change.  5. Class 3 severe obesity with serious comorbidity and body mass index (BMI) of 40.0 to 44.9 in adult, unspecified obesity type [facility]) Gail Crawford is currently in the action stage of change. As such, her goal is to continue with weight  loss efforts. She has agreed to the Category 2 Plan.   Exercise goals: For substantial health benefits, adults should do at least 150 minutes (2 hours and 30 minutes) a week of moderate-intensity, or 75 minutes (1 hour and 15 minutes) a week of vigorous-intensity aerobic physical activity, or an equivalent combination of moderate- and vigorous-intensity aerobic activity. Aerobic activity should be performed in episodes of at least 10 minutes, and preferably, it should be spread throughout the week.  Behavioral modification strategies: increasing lean protein intake and increasing water intake.  Gail Crawford has agreed to follow-up with our clinic in 2 weeks. She was informed of the importance of frequent follow-up visits to maximize her success with intensive lifestyle modifications for her multiple health conditions.   Objective:   Blood pressure 126/75, pulse 68, temperature 98.3 F (36.8 C), temperature source Oral, height 5\' 5"  (1.651 m), weight 240 lb (108.9 kg), SpO2 99 %. Body mass index is 39.94 kg/m.  General: Cooperative, alert, well developed, in no acute distress. HEENT: Conjunctivae and lids unremarkable. Cardiovascular: Regular rhythm.  Lungs: Normal work of breathing. Neurologic: No focal deficits.   Lab Results  Component Value Date   CREATININE 0.80 10/09/2019   BUN 17 10/09/2019   NA 140 10/09/2019   K 4.4 10/09/2019   CL 98 10/09/2019   CO2 24 10/09/2019   Lab Results  Component Value Date   ALT 13 10/09/2019   AST 20 10/09/2019   ALKPHOS 111 10/09/2019   BILITOT 0.4 10/09/2019   Lab Results  Component Value Date   HGBA1C 6.3 (H) 10/09/2019   HGBA1C 6.0 (H) 05/25/2019   HGBA1C 5.8 (H) 01/03/2019   HGBA1C 7.1 (H) 09/05/2018   Lab Results  Component Value Date   INSULIN 8.6 05/25/2019   INSULIN 13.3 01/03/2019   INSULIN 15.8 09/05/2018   Lab Results  Component Value Date   TSH 1.920 09/05/2018   Lab Results  Component Value Date   CHOL 226 (H)  10/09/2019   HDL 45 10/09/2019   LDLCALC 139 (H) 10/09/2019   TRIG 235 (H) 10/09/2019   CHOLHDL 5.0 (H) 10/09/2019   Lab Results  Component Value Date   WBC 9.2 10/09/2019   HGB 13.3 10/09/2019   HCT 39.9 10/09/2019   MCV 86 10/09/2019   PLT 211 10/09/2019   Attestation Statements:   Reviewed by clinician on day of visit: allergies, medications, problem list, medical history, surgical history, family history, social history, and previous encounter notes.  I, Water quality scientist, CMA, am acting as Location manager for [employer], DO.  I have reviewed the above documentation for accuracy and completeness, and I agree with the above. Briscoe Deutscher, DO

## 2020-01-11 ENCOUNTER — Encounter (INDEPENDENT_AMBULATORY_CARE_PROVIDER_SITE_OTHER): Payer: Self-pay | Admitting: Family Medicine

## 2020-01-11 NOTE — Telephone Encounter (Signed)
Please advise. Thanks.  

## 2020-01-18 ENCOUNTER — Other Ambulatory Visit (INDEPENDENT_AMBULATORY_CARE_PROVIDER_SITE_OTHER): Payer: Self-pay | Admitting: *Deleted

## 2020-01-18 DIAGNOSIS — E1169 Type 2 diabetes mellitus with other specified complication: Secondary | ICD-10-CM

## 2020-01-18 DIAGNOSIS — F3289 Other specified depressive episodes: Secondary | ICD-10-CM

## 2020-01-18 MED ORDER — TRULICITY 0.75 MG/0.5ML ~~LOC~~ SOAJ: 0.7500 mg | SUBCUTANEOUS | 0 refills | Status: DC

## 2020-01-18 NOTE — Telephone Encounter (Signed)
Please advise thanks.

## 2020-01-25 ENCOUNTER — Encounter (INDEPENDENT_AMBULATORY_CARE_PROVIDER_SITE_OTHER): Payer: Self-pay | Admitting: Family Medicine

## 2020-01-25 NOTE — Telephone Encounter (Signed)
Please advise. Thanks.  

## 2020-01-26 ENCOUNTER — Ambulatory Visit: Payer: Self-pay | Admitting: Orthopaedic Surgery

## 2020-01-30 ENCOUNTER — Ambulatory Visit (INDEPENDENT_AMBULATORY_CARE_PROVIDER_SITE_OTHER): Payer: Managed Care, Other (non HMO)

## 2020-01-30 ENCOUNTER — Ambulatory Visit (INDEPENDENT_AMBULATORY_CARE_PROVIDER_SITE_OTHER): Payer: Managed Care, Other (non HMO) | Admitting: Orthopaedic Surgery

## 2020-01-30 ENCOUNTER — Encounter: Payer: Self-pay | Admitting: Orthopaedic Surgery

## 2020-01-30 ENCOUNTER — Other Ambulatory Visit: Payer: Self-pay

## 2020-01-30 VITALS — Ht 65.0 in | Wt 246.4 lb

## 2020-01-30 DIAGNOSIS — M1711 Unilateral primary osteoarthritis, right knee: Secondary | ICD-10-CM | POA: Insufficient documentation

## 2020-01-30 NOTE — Progress Notes (Signed)
Office Visit Note   Patient: Gail Crawford           Date of Birth: [birth year]           MRN: [id number] Visit Date: 01/30/2020              Requested by: Chesley Noon, MD [place],  [place] [address] PCP: Chesley Noon, MD   Assessment & Plan: Visit Diagnoses:  1. Primary osteoarthritis of right knee     Plan: Impression is end-stage left knee DJD.  She has not had much relief from conservative treatment.  Her most recent A1c 3 months ago was 6.3.  Unfortunately her BMI is 41 and therefore I recommended postponing elective total knee replacement until her BMI is at 40 or less to minimize perioperative risk.  In the meantime she will increase diclofenac to twice a day and obtain a over-the-counter knee brace.  Continue to use Pennsaid.  She is currently already in weight management with Dr. Leafy Ro so she will be more compliant with her weight loss regimen.  She will follow-up with [place] as needed.  Follow-Up Instructions: Return if symptoms worsen or fail to improve.   Orders:  Orders Placed This Encounter  Procedures  . XR KNEE 3 VIEW RIGHT  . Ambulatory referral to Physical Therapy   No orders of the defined types were placed in this encounter.     Procedures: No procedures performed   Clinical Data: No additional findings.   Subjective: Chief Complaint  Patient presents with  . 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  She has had knee pain for many years.  Denies any injuries.  She has had cortisone and gel injections that give only very temporary relief.  She takes diclofenac once a day for the pain.  She works as a Systems analyst at [employer].  Denies any numbness and tingling or mechanical symptoms.   Review of Systems  Constitutional: Negative.   HENT: Negative.   Eyes: Negative.   Respiratory: Negative.   Cardiovascular: Negative.   Endocrine:  Negative.   Musculoskeletal: Negative.   Neurological: Negative.   Hematological: Negative.   Psychiatric/Behavioral: Negative.   All other systems reviewed and are negative.    Objective: Vital Signs: Ht 5\' 5"  (1.651 m)   Wt 246 lb 6.4 oz (111.8 kg)   BMI 41.00 kg/m   Physical Exam Vitals and nursing note reviewed.  Constitutional:      Appearance: She is well-developed.  HENT:     Head: Normocephalic and atraumatic.  Pulmonary:     Effort: Pulmonary effort is normal.  Abdominal:     Palpations: Abdomen is soft.  Musculoskeletal:     Cervical back: Neck supple.  Skin:    General: Skin is warm.     Capillary Refill: Capillary refill takes less than 2 seconds.  Neurological:     Mental Status: She is alert and oriented to person, place, and time.  Psychiatric:        Behavior: Behavior normal.        Thought Content: Thought content normal.        Judgment: Judgment normal.     Ortho Exam Right knee shows a trace joint effusion.  No joint line tenderness.  1+ patellofemoral crepitus with mild restriction range of motion. Specialty Comments:  No specialty comments available.  Imaging: XR  KNEE 3 VIEW RIGHT  Result Date: 01/30/2020 Significant joint space narrowing of the medial compartment.  Tricompartmental DJD.    PMFS History: Patient Active Problem List   Diagnosis Date Noted  . Primary osteoarthritis of right knee 01/30/2020  . Myalgia due to statin 12/13/2019  . Gastroesophageal reflux disease without esophagitis 08/30/2019  . Hyperlipidemia associated with type 2 diabetes mellitus ([place]), goal < 70, with statin-induced myalgias, Lipid Clinic has been offered  06/14/2019  . Vitamin D deficiency 10/24/2018  . Type 2 diabetes mellitus with other specified complication ([place]) [id number]  . Hypertension associated with diabetes ([place]) 10/06/2018  . Hypertensive cardiomegaly 09/12/2018  . 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 Peripheral  neuropathy 01/15/2012  . 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 Asthma 01-2004  . Back pain   . 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 HTN (hypertension)   . Joint pain   . Leg edema     Family History  Problem Relation Age of Onset  . Heart disease Mother   . Heart disease Father     Past Surgical History:  Procedure Laterality Date  . ABDOMINAL HYSTERECTOMY  07-15-04   TYPE II RADICAL HYSTERECTOMY WITH BSO  . [place]  . MASS EXCISION Left 06/11/2015   Procedure: EXCISION LEFT ABDOMINAL WALL MASS 6X3CM;  Surgeon: Stark Klein, MD;  Location: [facility];  Service: General;  Laterality: Left;  . ORTHOPEDIC SURGERY     RIGHT SHOULDER AND ELBOW AND RIGHT FOOT   Social History   Occupational History  . Occupation: Scientist, water quality  Tobacco Use  . Smoking status: Never Smoker  . Smokeless tobacco: Never Used  Substance and Sexual Activity  . Alcohol use: No  . Drug use: No  . Sexual activity: Not on file

## 2020-02-05 ENCOUNTER — Ambulatory Visit (INDEPENDENT_AMBULATORY_CARE_PROVIDER_SITE_OTHER): Payer: Managed Care, Other (non HMO) | Admitting: Family Medicine

## 2020-02-05 ENCOUNTER — Other Ambulatory Visit: Payer: Self-pay

## 2020-02-05 ENCOUNTER — Encounter (INDEPENDENT_AMBULATORY_CARE_PROVIDER_SITE_OTHER): Payer: Self-pay | Admitting: Family Medicine

## 2020-02-05 VITALS — BP 163/88 | HR 70 | Temp 98.3°F | Ht 65.0 in | Wt 241.0 lb

## 2020-02-05 DIAGNOSIS — I10 Essential (primary) hypertension: Secondary | ICD-10-CM

## 2020-02-05 DIAGNOSIS — I152 Hypertension secondary to endocrine disorders: Secondary | ICD-10-CM

## 2020-02-05 DIAGNOSIS — E1159 Type 2 diabetes mellitus with other circulatory complications: Secondary | ICD-10-CM

## 2020-02-05 DIAGNOSIS — Z9189 Other specified personal risk factors, not elsewhere classified: Secondary | ICD-10-CM

## 2020-02-05 DIAGNOSIS — E785 Hyperlipidemia, unspecified: Secondary | ICD-10-CM

## 2020-02-05 DIAGNOSIS — Z6841 Body Mass Index (BMI) 40.0 and over, adult: Secondary | ICD-10-CM

## 2020-02-05 DIAGNOSIS — E1169 Type 2 diabetes mellitus with other specified complication: Secondary | ICD-10-CM

## 2020-02-05 NOTE — Progress Notes (Signed)
Chief Complaint:   OBESITY Gail Crawford is here to discuss her progress with her obesity treatment plan along with follow-up of her obesity related diagnoses. Gail Crawford is on the Category 2 Plan and states she is following her eating plan approximately 25% of the time. Gail Crawford states she is in the pool and walking for 40 minutes 2 times per week.  Today's visit was #: 30 Starting weight: 266 lbs Starting date: 09/05/2018 Today's weight: 241 lbs Today's date: 02/05/2020 Total lbs lost to date: 25 lbs Total lbs lost since last in-office visit: 0  Interim History: Gail Crawford took her first dose of Trulicity.  She stopped taking metformin.  She says that school is out.  She will be taking a road trip vacation in July.  She says she is doing well with breakfast.  She got a slicer for sandwiches.  She says her husband is very much on board.  Her cholesterol has decreased.  Subjective:   1. Type 2 diabetes mellitus with other specified complication, without long-term current use of insulin (HCC) She started 1.09 mg Trulicity today.  She has 4 pens left.    Lab Results  Component Value Date   HGBA1C 6.3 (H) 10/09/2019   HGBA1C 6.0 (H) 05/25/2019   HGBA1C 5.8 (H) 01/03/2019   Lab Results  Component Value Date   LDLCALC 139 (H) 10/09/2019   CREATININE 0.80 10/09/2019   Lab Results  Component Value Date   INSULIN 8.6 05/25/2019   INSULIN 13.3 01/03/2019   INSULIN 15.8 09/05/2018   2. Hyperlipidemia associated with type 2 diabetes mellitus ([place]), goal < 70, with statin-induced myalgias, Lipid Clinic has been offered  Gail Crawford has hyperlipidemia and has been trying to improve her cholesterol levels with intensive lifestyle modification including a low saturated fat diet, exercise and weight loss. She denies any chest pain, claudication or myalgias.  Lab Results  Component Value Date   ALT 13 10/09/2019   AST 20 10/09/2019   ALKPHOS 111 10/09/2019   BILITOT 0.4 10/09/2019   Lab  Results  Component Value Date   CHOL 226 (H) 10/09/2019   HDL 45 10/09/2019   LDLCALC 139 (H) 10/09/2019   TRIG 235 (H) 10/09/2019   CHOLHDL 5.0 (H) 10/09/2019   3. Hypertension associated with diabetes (Gail Crawford) Review: taking medications as instructed, no medication side effects noted, no chest pain on exertion, no dyspnea on exertion, no swelling of ankles.   BP Readings from Last 3 Encounters:  02/05/20 (!) 163/88  01/08/20 126/75  12/13/19 (!) 142/80   4. At risk for nausea Gail Crawford is at risk for nausea due to starting Trulicity.  Assessment/Plan:   1. Type 2 diabetes mellitus with other specified complication, without long-term current use of insulin (HCC) She will increase Trulicity to 1.5 mg weekly if tolerating at next visit.  Note:  We use [employer] for no cost.  2. Hyperlipidemia associated with type 2 diabetes mellitus ([place]), goal < 70, with statin-induced myalgias, Lipid Clinic has been offered  Cardiovascular risk and specific lipid/LDL goals reviewed.  We discussed several lifestyle modifications today and Gail Crawford will continue to work on diet, exercise and weight loss efforts. Orders and follow up as documented in patient record.   Counseling Intensive lifestyle modifications are the first line treatment for this issue. . Dietary changes: Increase soluble fiber. Decrease simple carbohydrates. . Exercise changes: Moderate to vigorous-intensity aerobic activity 150 minutes per week if tolerated. . Lipid-lowering medications: see documented in medical record.  3. Hypertension associated with diabetes ([place]) Gail Crawford is working on healthy weight loss and exercise to improve blood pressure control. We will watch for signs of hypotension as she continues her lifestyle modifications.  4. At risk for nausea Gail Crawford was given approximately 15 minutes of nausea prevention counseling today. Gail Crawford is at risk for nausea due to her new or current  medication. She was encouraged to titrate her medication slowly, make sure to stay hydrated, eat smaller portions throughout the day, and avoid high fat meals.   5. Class 3 severe obesity with serious comorbidity and body mass index (BMI) of 40.0 to 44.9 in adult, unspecified obesity type Gail Crawford) Gail Crawford is currently in the action stage of change. As such, her goal is to continue with weight loss efforts. She has agreed to the Category 2 Plan.   Exercise goals: For substantial health benefits, adults should do at least 150 minutes (2 hours and 30 minutes) a week of moderate-intensity, or 75 minutes (1 hour and 15 minutes) a week of vigorous-intensity aerobic physical activity, or an equivalent combination of moderate- and vigorous-intensity aerobic activity. Aerobic activity should be performed in episodes of at least 10 minutes, and preferably, it should be spread throughout the week.  Behavioral modification strategies: increasing lean protein intake, increasing water intake and increasing high fiber foods.  Gail Crawford has agreed to follow-up with our clinic in 3 weeks. She was informed of the importance of frequent follow-up visits to maximize her success with intensive lifestyle modifications for her multiple health conditions.   Objective:   Blood pressure (!) 163/88, pulse 70, temperature 98.3 F (36.8 C), temperature source Oral, height 5\' 5"  (1.651 m), weight 241 lb (109.3 kg), SpO2 99 %. Body mass index is 40.1 kg/m.  General: Cooperative, alert, well developed, in no acute distress. HEENT: Conjunctivae and lids unremarkable. Cardiovascular: Regular rhythm.  Lungs: Normal work of breathing. Neurologic: No focal deficits.   Lab Results  Component Value Date   CREATININE 0.80 10/09/2019   BUN 17 10/09/2019   NA 140 10/09/2019   K 4.4 10/09/2019   CL 98 10/09/2019   CO2 24 10/09/2019   Lab Results  Component Value Date   ALT 13 10/09/2019   AST 20 10/09/2019   ALKPHOS 111  10/09/2019   BILITOT 0.4 10/09/2019   Lab Results  Component Value Date   HGBA1C 6.3 (H) 10/09/2019   HGBA1C 6.0 (H) 05/25/2019   HGBA1C 5.8 (H) 01/03/2019   HGBA1C 7.1 (H) 09/05/2018   Lab Results  Component Value Date   INSULIN 8.6 05/25/2019   INSULIN 13.3 01/03/2019   INSULIN 15.8 09/05/2018   Lab Results  Component Value Date   TSH 1.920 09/05/2018   Lab Results  Component Value Date   CHOL 226 (H) 10/09/2019   HDL 45 10/09/2019   LDLCALC 139 (H) 10/09/2019   TRIG 235 (H) 10/09/2019   CHOLHDL 5.0 (H) 10/09/2019   Lab Results  Component Value Date   WBC 9.2 10/09/2019   HGB 13.3 10/09/2019   HCT 39.9 10/09/2019   MCV 86 10/09/2019   PLT 211 10/09/2019   Attestation Statements:   Reviewed by clinician on day of visit: allergies, medications, problem list, medical history, surgical history, family history, social history, and previous encounter notes.  I, Water quality scientist, CMA, am acting as transcriptionist for Briscoe Deutscher, DO  I have reviewed the above documentation for accuracy and completeness, and I agree with the above. Briscoe Deutscher, DO

## 2020-02-15 ENCOUNTER — Ambulatory Visit: Payer: Managed Care, Other (non HMO) | Attending: Orthopaedic Surgery

## 2020-02-15 ENCOUNTER — Other Ambulatory Visit: Payer: Self-pay

## 2020-02-15 DIAGNOSIS — M25561 Pain in right knee: Secondary | ICD-10-CM | POA: Diagnosis present

## 2020-02-15 DIAGNOSIS — M1711 Unilateral primary osteoarthritis, right knee: Secondary | ICD-10-CM | POA: Insufficient documentation

## 2020-02-15 DIAGNOSIS — M25661 Stiffness of right knee, not elsewhere classified: Secondary | ICD-10-CM | POA: Diagnosis present

## 2020-02-15 DIAGNOSIS — G8929 Other chronic pain: Secondary | ICD-10-CM | POA: Diagnosis present

## 2020-02-18 NOTE — Patient Instructions (Signed)
Pt was provided a hand written YEB:XIDHWY hamstring stretch; supine SLR and SLR c ER; standing front kick, side kick, back kick, knee lift and leg curl.

## 2020-02-18 NOTE — Therapy (Signed)
[place], [place], [address] Phone: [phone]   Fax:  [phone]  Physical Therapy Evaluation  Patient Details  Name: Gail Crawford MRN: [id number] Date of Birth: [birth year] Referring Provider (PT): Leandrew Koyanagi, MD   Encounter Date: 02/15/2020   PT End of Session - 02/18/20 2322    Visit Number 1    Number of Visits 2    Date for PT Re-Evaluation 03/06/20    Authorization Type [employer] MANAGED    PT Start Time 1138    PT Stop Time 1226    PT Time Calculation (min) 48 min    Activity Tolerance Patient tolerated treatment well    Behavior During Therapy [facility] for tasks assessed/performed           Past Medical History:  Diagnosis Date  . Asthma 01-2004  . Back pain   . 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 HTN (hypertension)   . Joint pain   . Leg edema     Past Surgical History:  Procedure Laterality Date  . ABDOMINAL HYSTERECTOMY  07-15-04   TYPE II RADICAL HYSTERECTOMY WITH BSO  . [place]  . MASS EXCISION Left 06/11/2015   Procedure: EXCISION LEFT ABDOMINAL WALL MASS 6X3CM;  Surgeon: Stark Klein, MD;  Location: [place];  Service: General;  Laterality: Left;  . ORTHOPEDIC SURGERY     RIGHT SHOULDER AND ELBOW AND RIGHT FOOT    There were no vitals filed for this visit.    Subjective Assessment - 02/18/20 2310    Subjective Pt reports she has had progressively wrosening R knee pain occuring over the last 3 years. 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  She states the school year ended 2 weeks ago and she is now pain free. pt states being told hs ehas bone on bone OA. Pt states she exercises/walks in a pool 3x a week. Also, she states she has lost 30 pounds.    Limitations Standing;Walking    Patient Stated Goals To manage her R knee pain as best as she can.     Currently in Pain? No/denies              [facility] PT Assessment - 02/18/20 0001      Assessment   Medical Diagnosis Primary OA of right knee    Referring Provider (PT) Leandrew Koyanagi, MD    Onset Date/Surgical Date --   pain progressively wrose over the past 3 years   Prior Therapy no      Precautions   Precautions Cervical      Restrictions   Weight Bearing Restrictions No      Balance Screen   Has the patient fallen in the past 6 months No      [place] residence    Living Arrangements Spouse/significant other    Type of Home House      Prior Function   Level of Independence Independent      Cognition   Overall Cognitive Status Within Functional Limits for tasks assessed      Observation/Other Assessments   Observations pes planus B feet      Observation/Other Assessments-Edema    Edema --   none noted     Sensation   Light Touch Appears Intact      Coordination  Gross Motor Movements are Fluid and Coordinated Yes      ROM / Strength   AROM / PROM / Strength AROM;Strength      AROM   AROM Assessment Site Knee    Right/Left Knee Right;Left    Right Knee Extension -6    Right Knee Flexion 128    Left Knee Extension 0    Left Knee Flexion 130      Strength   Strength Assessment Site Knee    Right/Left Knee Right;Left    Right Knee Flexion 5/5    Right Knee Extension 5/5   min. crepitus    Left Knee Flexion 5/5    Left Knee Extension 5/5      Palpation   Palpation comment Currently not TTP      Special Tests    Special Tests Knee Special Tests;Meniscus Tests    Knee Special tests  Patellofemoral Apprehension Test;Patellofemoral Grind Test (Clarke's Sign);McConnell Test    Meniscus Tests McMurray Test      McConnell Test   Findings Negative    Side Right      Patellofemoral Apprehension Test    Findings Negative    Side  Right      Patellofemoral Grind test (Clark's Sign)   Findings Negative    Side   Right      McMurray Test   Findings Negative    Side Right      Transfers   Transfers Sit to Stand;Stand to Sit    Sit to Stand 7: Independent      Ambulation/Gait   Ambulation/Gait Yes    Gait Pattern Step-through pattern   min. out toeing; lateral heel strike to medial toe off                     Objective measurements completed on examination: See above findings.               PT Education - 02/18/20 2321    Education Details Eval fndings; POC, HEP- pt can complete standing exs in pool. Recommended a cushioned/supportive shoe and 3 locations to consider obtaining.    Person(s) Educated Patient    Methods Explanation;Demonstration;Tactile cues;Verbal cues;Handout    Comprehension Need further instruction;Tactile cues required;Verbal cues required;Returned demonstration;Verbalized understanding               PT Long Term Goals - 02/18/20 2345      PT LONG TERM GOAL #1   Title Pt will be Ind c a HEP to improve R knee ext ROM and B LE strength    Baseline Initiated on eval    Time 3    Period Weeks    Status New    Target Date 03/10/20      PT LONG TERM GOAL #2   Title Improve R knee ext ROM to 0d    Baseline -6d    Time 3    Period Weeks    Status New    Target Date 03/10/20      PT LONG TERM GOAL #3   Title Pt will voice understanding of where she can obtain shoes which can provide cushion and support    Time 3    Period Weeks    Status New    Target Date 03/10/20                  Plan - 02/18/20 2328    Clinical Impression Statement Pt presented today with hr  R knee pain a 0/10. Pt works in [employer], and since school let out for summer 2 weeks ago, she is no longer having R knee pain and it was not provoked c today's evaluation. the eval did reveal decreased ext ROM of the R knee. Pt was porvided a HEP to improve the R knee ext ROM and LE strengthening, some which she can complete in a pool. pt states she walks in  a pool 2x a week. Additionally, discussed pt.obtaining cushioned/supportive shoes and provided info on 3 stores which would be helpful in finding the right fit. Pt will return for additional visit to assess her how she is responding to the exs and to determine future care.    Personal Factors and Comorbidities Age;Time since onset of injury/illness/exacerbation    Examination-Activity Limitations Stand;Squat    Stability/Clinical Decision Making Stable/Uncomplicated    Clinical Decision Making Low    Rehab Potential Fair    PT Frequency 4x / week    PT Treatment/Interventions ADLs/Self Care Home Management;Cryotherapy;Electrical Stimulation;Iontophoresis 4mg /ml Dexamethasone;Gait training;Therapeutic exercise;Manual techniques;Patient/family education;Passive range of motion;Taping    PT Next Visit Plan Assess response to HEP and need for the continuaton of PT.    PT Home Exercise Plan Hnad written HEP provided. See instructions    Consulted and Agree with Plan of Care Patient           Patient will benefit from skilled therapeutic intervention in order to improve the following deficits and impairments:  Decreased range of motion, Pain, Other (comment) (Stress/strain management of knees)  Visit Diagnosis: Primary osteoarthritis of right knee - Plan: PT plan of care cert/re-cert  Chronic pain of right knee - Plan: PT plan of care cert/re-cert  Decreased range of motion (ROM) of right knee - Plan: PT plan of care cert/re-cert     Problem List Patient Active Problem List   Diagnosis Date Noted  . Primary osteoarthritis of right knee 01/30/2020  . Myalgia due to statin 12/13/2019  . Gastroesophageal reflux disease without esophagitis 08/30/2019  . Hyperlipidemia associated with type 2 diabetes mellitus ([place]), goal < 70, with statin-induced myalgias, Lipid Clinic has been offered  06/14/2019  . Vitamin D deficiency 10/24/2018  . Type 2 diabetes mellitus with other specified  complication ([place]) 37/62/8315  . Hypertension associated with diabetes ([place]) 10/06/2018  . Hypertensive cardiomegaly 09/12/2018  . Fallopian tube cancer, carcinoma ([place]) 01/15/2012  . Peripheral neuropathy 01/15/2012  . Malignant neoplasm of fallopian tube [facility]) 07/27/2011    Gar Ponto MS, PT 02/18/20 11:58 PM  [facility] [address] [place], [place], [address] Phone: [phone]   Fax:  [phone]  Name: Gail Crawford MRN: [id number] Date of Birth: 10-Feb-1956

## 2020-02-26 ENCOUNTER — Ambulatory Visit (INDEPENDENT_AMBULATORY_CARE_PROVIDER_SITE_OTHER): Payer: Managed Care, Other (non HMO) | Admitting: Family Medicine

## 2020-02-26 ENCOUNTER — Other Ambulatory Visit: Payer: Self-pay

## 2020-02-26 VITALS — BP 131/64 | HR 68 | Temp 98.3°F | Ht 65.0 in | Wt 239.0 lb

## 2020-02-26 DIAGNOSIS — Z9189 Other specified personal risk factors, not elsewhere classified: Secondary | ICD-10-CM | POA: Diagnosis not present

## 2020-02-26 DIAGNOSIS — Z6839 Body mass index (BMI) 39.0-39.9, adult: Secondary | ICD-10-CM

## 2020-02-26 DIAGNOSIS — E1169 Type 2 diabetes mellitus with other specified complication: Secondary | ICD-10-CM

## 2020-02-26 MED ORDER — TRULICITY 1.5 MG/0.5ML ~~LOC~~ SOAJ: 1.5000 mg | SUBCUTANEOUS | 0 refills | Status: DC

## 2020-02-26 NOTE — Progress Notes (Signed)
Chief Complaint:   OBESITY Gail Crawford is here to discuss her progress with her obesity treatment plan along with follow-up of her obesity related diagnoses. Gail Crawford is on the Category 2 Plan and states she is following her eating plan approximately 50% of the time. Gail Crawford states she is doing water aerobics for 40 minutes 2 times per week.  Today's visit was #: 30 Starting weight: 266 lbs Starting date: 09/05/2018 Today's weight: 239 lbs Today's date: 02/26/2020 Total lbs lost to date: 27 lbs Total lbs lost since last in-office visit: 2 lbs  Interim History: Gail Crawford says she is going on vacation on July 12th, and will be gone for 10-11 days.  She is taking a road trip to [place].  She says she will be bringing coolers for healthy snacks.  She feels that she has been stalled with weight loss secondary to not following the plan.  Subjective:   1. Type 2 diabetes mellitus with other specified complication, without long-term current use of insulin (HCC) Medications reviewed. Diabetic ROS: no polyuria or polydipsia, no chest pain, dyspnea or TIA's, no numbness, tingling or pain in extremities.  She is on Trulicity.  Reviewed bowel regimen.  Lab Results  Component Value Date   HGBA1C 6.3 (H) 10/09/2019   HGBA1C 6.0 (H) 05/25/2019   HGBA1C 5.8 (H) 01/03/2019   Lab Results  Component Value Date   LDLCALC 139 (H) 10/09/2019   CREATININE 0.80 10/09/2019   Lab Results  Component Value Date   INSULIN 8.6 05/25/2019   INSULIN 13.3 01/03/2019   INSULIN 15.8 09/05/2018   2. At risk for constipation Gail Crawford is at increased risk for constipation due to inadequate water intake, changes in diet, and/or use of medications such as GLP1 agonists. Gail Crawford denies hard, infrequent stools currently.   Assessment/Plan:   1. Type 2 diabetes mellitus with other specified complication, without long-term current use of insulin (HCC) Good blood sugar control is important to decrease the  likelihood of diabetic complications such as nephropathy, neuropathy, limb loss, blindness, coronary artery disease, and death. Intensive lifestyle modification including diet, exercise and weight loss are the first line of treatment for diabetes.   Orders - Dulaglutide (TRULICITY) 1.5 WU/9.8JX SOPN; Inject 0.5 mLs (1.5 mg total) into the skin once a week.  Dispense: 4 pen; Refill: 0  2. At risk for constipation Gail Crawford was given approximately 15 minutes of counseling today regarding prevention of constipation. She was encouraged to increase water and fiber intake.   3. Class 2 severe obesity with serious comorbidity and body mass index (BMI) of 39.0 to 39.9 in adult, unspecified obesity type [facility]) Gail Crawford is currently in the action stage of change. As such, her goal is to continue with weight loss efforts. She has agreed to the Category 2 Plan.   Exercise goals: For substantial health benefits, adults should do at least 150 minutes (2 hours and 30 minutes) a week of moderate-intensity, or 75 minutes (1 hour and 15 minutes) a week of vigorous-intensity aerobic physical activity, or an equivalent combination of moderate- and vigorous-intensity aerobic activity. Aerobic activity should be performed in episodes of at least 10 minutes, and preferably, it should be spread throughout the week.  Behavioral modification strategies: increasing lean protein intake and travel eating strategies.  Gail Crawford has agreed to follow-up with our clinic in 2 weeks. She was informed of the importance of frequent follow-up visits to maximize her success with intensive lifestyle modifications for her multiple health conditions.   Objective:  Blood pressure 131/64, pulse 68, temperature 98.3 F (36.8 C), temperature source Oral, height 5\' 5"  (1.651 m), weight 239 lb (108.4 kg), SpO2 98 %. Body mass index is 39.77 kg/m.  General: Cooperative, alert, well developed, in no acute distress. HEENT: Conjunctivae and  lids unremarkable. Cardiovascular: Regular rhythm.  Lungs: Normal work of breathing. Neurologic: No focal deficits.   Lab Results  Component Value Date   CREATININE 0.80 10/09/2019   BUN 17 10/09/2019   NA 140 10/09/2019   K 4.4 10/09/2019   CL 98 10/09/2019   CO2 24 10/09/2019   Lab Results  Component Value Date   ALT 13 10/09/2019   AST 20 10/09/2019   ALKPHOS 111 10/09/2019   BILITOT 0.4 10/09/2019   Lab Results  Component Value Date   HGBA1C 6.3 (H) 10/09/2019   HGBA1C 6.0 (H) 05/25/2019   HGBA1C 5.8 (H) 01/03/2019   HGBA1C 7.1 (H) 09/05/2018   Lab Results  Component Value Date   INSULIN 8.6 05/25/2019   INSULIN 13.3 01/03/2019   INSULIN 15.8 09/05/2018   Lab Results  Component Value Date   TSH 1.920 09/05/2018   Lab Results  Component Value Date   CHOL 226 (H) 10/09/2019   HDL 45 10/09/2019   LDLCALC 139 (H) 10/09/2019   TRIG 235 (H) 10/09/2019   CHOLHDL 5.0 (H) 10/09/2019   Lab Results  Component Value Date   WBC 9.2 10/09/2019   HGB 13.3 10/09/2019   HCT 39.9 10/09/2019   MCV 86 10/09/2019   PLT 211 10/09/2019   Attestation Statements:   Reviewed by clinician on day of visit: allergies, medications, problem list, medical history, surgical history, family history, social history, and previous encounter notes.  I, Water quality scientist, CMA, am acting as transcriptionist for Briscoe Deutscher, DO  I have reviewed the above documentation for accuracy and completeness, and I agree with the above. Briscoe Deutscher, DO

## 2020-02-28 ENCOUNTER — Encounter (INDEPENDENT_AMBULATORY_CARE_PROVIDER_SITE_OTHER): Payer: Self-pay | Admitting: Family Medicine

## 2020-03-06 ENCOUNTER — Ambulatory Visit: Payer: Managed Care, Other (non HMO)

## 2020-03-25 ENCOUNTER — Ambulatory Visit: Payer: Managed Care, Other (non HMO) | Attending: Orthopaedic Surgery

## 2020-03-25 ENCOUNTER — Encounter (INDEPENDENT_AMBULATORY_CARE_PROVIDER_SITE_OTHER): Payer: Self-pay

## 2020-03-25 ENCOUNTER — Other Ambulatory Visit: Payer: Self-pay

## 2020-03-25 DIAGNOSIS — M25561 Pain in right knee: Secondary | ICD-10-CM | POA: Diagnosis present

## 2020-03-25 DIAGNOSIS — G8929 Other chronic pain: Secondary | ICD-10-CM

## 2020-03-25 DIAGNOSIS — M25661 Stiffness of right knee, not elsewhere classified: Secondary | ICD-10-CM | POA: Diagnosis present

## 2020-03-25 DIAGNOSIS — M1711 Unilateral primary osteoarthritis, right knee: Secondary | ICD-10-CM | POA: Diagnosis not present

## 2020-03-26 ENCOUNTER — Other Ambulatory Visit (INDEPENDENT_AMBULATORY_CARE_PROVIDER_SITE_OTHER): Payer: Self-pay | Admitting: Family Medicine

## 2020-03-26 DIAGNOSIS — E1169 Type 2 diabetes mellitus with other specified complication: Secondary | ICD-10-CM

## 2020-03-26 NOTE — Therapy (Addendum)
[place], [place], [address] Phone: [phone]   Fax:  [phone]  Physical Therapy Treatment/Re-Cert  Patient Details  Name: Gail Crawford MRN: [id number] Date of Birth: [birth year] Referring Provider (PT): Leandrew Koyanagi, MD   Encounter Date: 03/25/2020   PT End of Session - 03/26/20 1230    Visit Number 2    Number of Visits 2    Date for PT Re-Evaluation 03/06/20    Authorization Type [employer] MANAGED    PT Start Time 1050    PT Stop Time 1134    PT Time Calculation (min) 44 min    Activity Tolerance Patient tolerated treatment well    Behavior During Therapy [facility] for tasks assessed/performed           Past Medical History:  Diagnosis Date  . Asthma 01-2004  . Back pain   . 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 HTN (hypertension)   . Joint pain   . Leg edema     Past Surgical History:  Procedure Laterality Date  . ABDOMINAL HYSTERECTOMY  07-15-04   TYPE II RADICAL HYSTERECTOMY WITH BSO  . [place]  . MASS EXCISION Left 06/11/2015   Procedure: EXCISION LEFT ABDOMINAL WALL MASS 6X3CM;  Surgeon: Stark Klein, MD;  Location: [facility];  Service: General;  Laterality: Left;  . ORTHOPEDIC SURGERY     RIGHT SHOULDER AND ELBOW AND RIGHT FOOT    There were no vitals filed for this visit.   Subjective Assessment - 03/26/20 1212    Subjective SInce the initial eval, pr reports she has continued to do well with no issues of R knee knee since school let out for the summer. Pt states she obtain New balance shoes with a personalized insert and the shoes have been comfortable. Pt reports consistent completion of her HEP with the opportunity to complete them in the pool most of the time.    Currently in Pain? No/denies              [facility] PT Assessment - 03/26/20 0001      AROM   Right Knee Extension 0                          [facility] Adult PT Treatment/Exercise - 03/26/20 0001      Exercises   Exercises Knee/Hip      Knee/Hip Exercises: Stretches   Active Hamstring Stretch Right;Left;4 reps;20 seconds      Knee/Hip Exercises: Standing   Heel Raises Both;15 reps    Heel Raises Limitations toe lifts    Knee Flexion Left;Right;10 reps    Knee Flexion Limitations counter    Hip Flexion Stengthening;Right;Left;15 reps    Hip Flexion Limitations counter    Hip Abduction Stengthening;Right;Left;15 reps    Abduction Limitations counter    Hip Extension Stengthening;Right;Left;15 reps    Extension Limitations counter      Knee/Hip Exercises: Seated   Long Arc Quad Strengthening;Right;Left;15 reps      Knee/Hip Exercises: Supine   Straight Leg Raises Strengthening;Right;Left;15 reps    Straight Leg Raise with External Rotation Strengthening;Right;Left;15 reps                  PT Education - 03/26/20 1221    Education Details HRP: For R knee and strengthening exs    Person(s) Educated Patient    Methods Explanation;Demonstration;Verbal cues;Handout;Tactile cues  Comprehension Verbalized understanding;Returned demonstration;Verbal cues required;Tactile cues required               PT Long Term Goals - 03/26/20 1232      PT LONG TERM GOAL #1   Title Pt will be Ind c a HEP to improve R knee ext ROM and B LE strength. Met    Status Achieved      PT LONG TERM GOAL #2   Title Improve R knee ext ROM to 0d. Met    Baseline -6d    Status Achieved      PT LONG TERM GOAL #3   Title Pt will voice understanding of where she can obtain shoes which can provide cushion and support. Met    Status Achieved                 Plan - 03/26/20 1234    Clinical Impression Statement Pt is doing well re: R knee pain. R knee pain has been resolved since her time on her feet decreased c the end of the school year. Pt has been active with vacation and has been completing her HEP. Pt also obtain supportive,  cushioned shoes with personalized inserts. Pt's HEP was progressed today. Wiil continue PT 1w6 starting 04/23/20 as needed, after the pt has returned to work and the response of her R knee pain can be assessed.    Personal Factors and Comorbidities Age;Time since onset of injury/illness/exacerbation    Examination-Activity Limitations Stand;Squat    Stability/Clinical Decision Making Stable/Uncomplicated    Clinical Decision Making Low    PT Frequency 1x / week    PT Duration 6 weeks    PT Treatment/Interventions ADLs/Self Care Home Management;Cryotherapy;Electrical Stimulation;Iontophoresis 7m/ml Dexamethasone;Gait training;Therapeutic exercise;Manual techniques;Patient/family education;Passive range of motion;Taping    PT Next Visit Plan Assess response to R knee pain and continued PT as indicated    PT Home Exercise Plan [id number]   Consulted and Agree with Plan of Care Patient           Patient will benefit from skilled therapeutic intervention in order to improve the following deficits and impairments:  Decreased range of motion, Pain, Other (comment)  Visit Diagnosis: Primary osteoarthritis of right knee - Plan: PT plan of care cert/re-cert  Chronic pain of right knee - Plan: PT plan of care cert/re-cert  Decreased range of motion (ROM) of right knee - Plan: PT plan of care cert/re-cert     Problem List Patient Active Problem List   Diagnosis Date Noted  . Primary osteoarthritis of right knee 01/30/2020  . Myalgia due to statin 12/13/2019  . Gastroesophageal reflux disease without esophagitis 08/30/2019  . Hyperlipidemia associated with type 2 diabetes mellitus ([place], goal < 70, with statin-induced myalgias, Lipid Clinic has been offered  06/14/2019  . Vitamin D deficiency 10/24/2018  . Type 2 diabetes mellitus with other specified complication ([place] [id number] . Hypertension associated with diabetes ([place] 10/06/2018  . Hypertensive cardiomegaly 09/12/2018  . Fallopian  tube cancer, carcinoma ([place] 01/15/2012  . Peripheral neuropathy 01/15/2012  . Malignant neoplasm of fallopian tube ([facility] 07/27/2011   AGar PontoMS, PT 03/26/20 12:51 PM   [facility] [address]Phone: [phone]  Fax:  [phone] Name: Gail KAGEMRN: [id number] of Birth: 121-May-1957  PHYSICAL THERAPY DISCHARGE SUMMARY  Visits from Start of Care: 2  Current functional level related to goals / functional outcomes: unknown  Remaining deficits Unknown, self DC   Education / Equipment: HEP Plan: Patient agrees to discharge.  Patient goals were partially met. Patient is being discharged due to not returning since the last visit.  ?????        Mahlik Lenn MS, PT 10/30/20 9:21 AM

## 2020-03-27 ENCOUNTER — Ambulatory Visit (INDEPENDENT_AMBULATORY_CARE_PROVIDER_SITE_OTHER): Payer: Managed Care, Other (non HMO) | Admitting: Family Medicine

## 2020-03-27 ENCOUNTER — Other Ambulatory Visit (INDEPENDENT_AMBULATORY_CARE_PROVIDER_SITE_OTHER): Payer: Self-pay | Admitting: Family Medicine

## 2020-03-27 DIAGNOSIS — E559 Vitamin D deficiency, unspecified: Secondary | ICD-10-CM

## 2020-03-27 DIAGNOSIS — E1159 Type 2 diabetes mellitus with other circulatory complications: Secondary | ICD-10-CM

## 2020-04-02 MED ORDER — LOSARTAN POTASSIUM 50 MG PO TABS: 50.0000 mg | ORAL_TABLET | Freq: Every day | ORAL | 0 refills | Status: DC

## 2020-04-02 MED ORDER — TRULICITY 1.5 MG/0.5ML ~~LOC~~ SOAJ: 1.5000 mg | SUBCUTANEOUS | 0 refills | Status: DC

## 2020-04-02 MED ORDER — VITAMIN D (ERGOCALCIFEROL) 1.25 MG (50000 UNIT) PO CAPS: 50000.0000 [IU] | ORAL_CAPSULE | ORAL | 0 refills | Status: DC

## 2020-04-02 MED ORDER — CHLORTHALIDONE 25 MG PO TABS: 25.0000 mg | ORAL_TABLET | Freq: Every day | ORAL | 0 refills | Status: DC

## 2020-04-15 ENCOUNTER — Encounter (INDEPENDENT_AMBULATORY_CARE_PROVIDER_SITE_OTHER): Payer: Self-pay | Admitting: Family Medicine

## 2020-04-15 ENCOUNTER — Other Ambulatory Visit: Payer: Self-pay

## 2020-04-15 ENCOUNTER — Ambulatory Visit (INDEPENDENT_AMBULATORY_CARE_PROVIDER_SITE_OTHER): Payer: Managed Care, Other (non HMO) | Admitting: Family Medicine

## 2020-04-15 VITALS — BP 151/84 | HR 68 | Temp 98.0°F | Ht 65.0 in | Wt 240.0 lb

## 2020-04-15 DIAGNOSIS — I10 Essential (primary) hypertension: Secondary | ICD-10-CM

## 2020-04-15 DIAGNOSIS — E1169 Type 2 diabetes mellitus with other specified complication: Secondary | ICD-10-CM

## 2020-04-15 DIAGNOSIS — E559 Vitamin D deficiency, unspecified: Secondary | ICD-10-CM | POA: Diagnosis not present

## 2020-04-15 DIAGNOSIS — Z6841 Body Mass Index (BMI) 40.0 and over, adult: Secondary | ICD-10-CM

## 2020-04-15 DIAGNOSIS — E1159 Type 2 diabetes mellitus with other circulatory complications: Secondary | ICD-10-CM | POA: Diagnosis not present

## 2020-04-15 DIAGNOSIS — I152 Hypertension secondary to endocrine disorders: Secondary | ICD-10-CM

## 2020-04-15 DIAGNOSIS — E785 Hyperlipidemia, unspecified: Secondary | ICD-10-CM

## 2020-04-15 MED ORDER — CHLORTHALIDONE 25 MG PO TABS: 25.0000 mg | ORAL_TABLET | Freq: Every day | ORAL | 0 refills | Status: DC

## 2020-04-15 MED ORDER — LOSARTAN POTASSIUM 50 MG PO TABS: 50.0000 mg | ORAL_TABLET | Freq: Every day | ORAL | 0 refills | Status: DC

## 2020-04-15 MED ORDER — VITAMIN D (ERGOCALCIFEROL) 1.25 MG (50000 UNIT) PO CAPS: 50000.0000 [IU] | ORAL_CAPSULE | ORAL | 0 refills | Status: DC

## 2020-04-15 MED ORDER — TRULICITY 1.5 MG/0.5ML ~~LOC~~ SOAJ: 1.5000 mg | SUBCUTANEOUS | 0 refills | Status: DC

## 2020-04-15 NOTE — Progress Notes (Signed)
Chief Complaint:   OBESITY Gail Crawford is here to discuss her progress with her obesity treatment plan along with follow-up of her obesity related diagnoses. Gail Crawford is on the Category 2 Plan and states she is following her eating plan approximately 50% of the time. Gail Crawford states she is doing water aerobics for 45 minutes 3 times per week.  Today's visit was #: 4 Starting weight: 266 lbs Starting date: 09/05/2018 Today's weight: 240 lbs Today's date: 04/15/2020 Total lbs lost to date: 26 lbs Total lbs lost since last in-office visit: 0 Total weight loss percentage to date: -9.77%  Interim History: Gail Crawford has been out of her medications for weeks due to my absence and refills being deferred by covering providers.   Subjective:   1. Type 2 diabetes mellitus with other specified complication, without long-term current use of insulin (HCC) Medications reviewed. Diabetic ROS: no polyuria or polydipsia, no chest pain, dyspnea or TIA's, no numbness, tingling or pain in extremities.   A1c was 5.7 on 03/27/2020, obtained by PCP.  Lab Results  Component Value Date   HGBA1C 6.3 (H) 10/09/2019   HGBA1C 6.0 (H) 05/25/2019   HGBA1C 5.8 (H) 01/03/2019   Lab Results  Component Value Date   LDLCALC 139 (H) 10/09/2019   CREATININE 0.80 10/09/2019   Lab Results  Component Value Date   INSULIN 8.6 05/25/2019   INSULIN 13.3 01/03/2019   INSULIN 15.8 09/05/2018   2. Hyperlipidemia associated with type 2 diabetes mellitus ([place]), goal < 70, with statin-induced myalgias, Lipid Clinic has been offered  She denies any chest pain, claudication or myalgias.  LDL 166, triglycerides 206, and HDL 46 on 03/27/2020.  Lab Results  Component Value Date   ALT 13 10/09/2019   AST 20 10/09/2019   ALKPHOS 111 10/09/2019   BILITOT 0.4 10/09/2019   Lab Results  Component Value Date   CHOL 226 (H) 10/09/2019   HDL 45 10/09/2019   LDLCALC 139 (H) 10/09/2019   TRIG 235 (H) 10/09/2019   CHOLHDL 5.0  (H) 10/09/2019   3. Hypertension associated with diabetes ([place]) Review: taking medications as instructed, no medication side effects noted, no chest pain on exertion, no dyspnea on exertion, no swelling of ankles.  Creatinine 1.01 and GFR 59. On 03/27/2020.  BP Readings from Last 3 Encounters:  04/15/20 (!) 151/84  02/26/20 131/64  02/05/20 (!) 163/88   4. Vitamin D deficiency Gail Crawford's Vitamin D level was 40.9 on 03/27/2020. She is currently taking prescription vitamin D 50,000 IU each week.   Assessment/Plan:   1. Type 2 diabetes mellitus with other specified complication, without long-term current use of insulin (HCC) The current medical regimen is effective;  continue present plan and medications.  - Dulaglutide (TRULICITY) 1.5 AU/6.3FH SOPN; Inject 0.5 mLs (1.5 mg total) into the skin once a week.  Dispense: 6 mL; Refill: 0  2. Hyperlipidemia associated with type 2 diabetes mellitus ([place]), goal < 70, with statin-induced myalgias, Lipid Clinic has been offered  Not at goal.  I again offered a referral to the [facility] today but the patient would like to wait. We discussed several lifestyle modifications today and Gail Crawford will continue to work on diet, exercise and weight loss efforts.   3. Hypertension associated with diabetes ([place]) Not optimized. Restart medications. Diet: Avoid buying foods that are: processed, frozen, or prepackaged to avoid excess salt.  - losartan (COZAAR) 50 MG tablet; Take 1 tablet (50 mg total) by mouth daily.  Dispense: 90 tablet;  Refill: 0 - chlorthalidone (HYGROTON) 25 MG tablet; Take 1 tablet (25 mg total) by mouth daily.  Dispense: 90 tablet; Refill: 0  4. Vitamin D deficiency Not at goal. Optimal goal > 50 ng/dL. There is also evidence to support a goal of >70 ng/dL in patients with cancer and heart disease. Plan: Continue Vitamin D @50 ,000 IU every week with follow-up for routine testing of Vitamin D at least 2-3 times per year to avoid  over-replacement.  - Vitamin D, Ergocalciferol, (DRISDOL) 1.25 MG (50000 UNIT) CAPS capsule; Take 1 capsule (50,000 Units total) by mouth every 7 (seven) days.  Dispense: 12 capsule; Refill: 0  5. Class 3 severe obesity with serious comorbidity and body mass index (BMI) of 40.0 to 44.9 in adult, unspecified obesity type [facility]) Gail Crawford is currently in the action stage of change. As such, her goal is to continue with weight loss efforts. She has agreed to the Category 2 Plan.   Exercise goals: For substantial health benefits, adults should do at least 150 minutes (2 hours and 30 minutes) a week of moderate-intensity, or 75 minutes (1 hour and 15 minutes) a week of vigorous-intensity aerobic physical activity, or an equivalent combination of moderate- and vigorous-intensity aerobic activity. Aerobic activity should be performed in episodes of at least 10 minutes, and preferably, it should be spread throughout the week.  Behavioral modification strategies: increasing lean protein intake, decreasing simple carbohydrates and increasing vegetables.  Gail Crawford has agreed to follow-up with our clinic in 2 weeks. She was informed of the importance of frequent follow-up visits to maximize her success with intensive lifestyle modifications for her multiple health conditions.   Objective:   Blood pressure (!) 151/84, pulse 68, temperature 98 F (36.7 C), temperature source Oral, height 5\' 5"  (1.651 m), weight 240 lb (108.9 kg), SpO2 100 %. Body mass index is 39.94 kg/m.  General: Cooperative, alert, well developed, in no acute distress. HEENT: Conjunctivae and lids unremarkable. Cardiovascular: Regular rhythm.  Lungs: Normal work of breathing. Neurologic: No focal deficits.   Lab Results  Component Value Date   CREATININE 0.80 10/09/2019   BUN 17 10/09/2019   NA 140 10/09/2019   K 4.4 10/09/2019   CL 98 10/09/2019   CO2 24 10/09/2019   Lab Results  Component Value Date   ALT 13 10/09/2019    AST 20 10/09/2019   ALKPHOS 111 10/09/2019   BILITOT 0.4 10/09/2019   Lab Results  Component Value Date   HGBA1C 6.3 (H) 10/09/2019   HGBA1C 6.0 (H) 05/25/2019   HGBA1C 5.8 (H) 01/03/2019   HGBA1C 7.1 (H) 09/05/2018   Lab Results  Component Value Date   INSULIN 8.6 05/25/2019   INSULIN 13.3 01/03/2019   INSULIN 15.8 09/05/2018   Lab Results  Component Value Date   TSH 1.920 09/05/2018   Lab Results  Component Value Date   CHOL 226 (H) 10/09/2019   HDL 45 10/09/2019   LDLCALC 139 (H) 10/09/2019   TRIG 235 (H) 10/09/2019   CHOLHDL 5.0 (H) 10/09/2019   Lab Results  Component Value Date   WBC 9.2 10/09/2019   HGB 13.3 10/09/2019   HCT 39.9 10/09/2019   MCV 86 10/09/2019   PLT 211 10/09/2019   Attestation Statements:   Reviewed by clinician on day of visit: allergies, medications, problem list, medical history, surgical history, family history, social history, and previous encounter notes.  I, Water quality scientist, CMA, am acting as transcriptionist for [employer], DO  I have reviewed the above  documentation for accuracy and completeness, and I agree with the above. Briscoe Deutscher, DO

## 2020-04-23 ENCOUNTER — Ambulatory Visit: Payer: Managed Care, Other (non HMO) | Attending: Orthopaedic Surgery

## 2020-05-09 ENCOUNTER — Encounter (INDEPENDENT_AMBULATORY_CARE_PROVIDER_SITE_OTHER): Payer: Self-pay | Admitting: Family Medicine

## 2020-05-09 ENCOUNTER — Ambulatory Visit (INDEPENDENT_AMBULATORY_CARE_PROVIDER_SITE_OTHER): Payer: Managed Care, Other (non HMO) | Admitting: Family Medicine

## 2020-05-09 ENCOUNTER — Other Ambulatory Visit: Payer: Self-pay

## 2020-05-09 VITALS — BP 144/80 | HR 67 | Temp 98.2°F | Ht 65.0 in | Wt 234.0 lb

## 2020-05-09 DIAGNOSIS — E1169 Type 2 diabetes mellitus with other specified complication: Secondary | ICD-10-CM | POA: Diagnosis not present

## 2020-05-09 DIAGNOSIS — M1909 Primary osteoarthritis, other specified site: Secondary | ICD-10-CM

## 2020-05-09 DIAGNOSIS — Z9189 Other specified personal risk factors, not elsewhere classified: Secondary | ICD-10-CM | POA: Diagnosis not present

## 2020-05-09 DIAGNOSIS — I152 Hypertension secondary to endocrine disorders: Secondary | ICD-10-CM

## 2020-05-09 DIAGNOSIS — I10 Essential (primary) hypertension: Secondary | ICD-10-CM

## 2020-05-09 DIAGNOSIS — E559 Vitamin D deficiency, unspecified: Secondary | ICD-10-CM | POA: Diagnosis not present

## 2020-05-09 DIAGNOSIS — E1159 Type 2 diabetes mellitus with other circulatory complications: Secondary | ICD-10-CM | POA: Diagnosis not present

## 2020-05-09 DIAGNOSIS — Z6839 Body mass index (BMI) 39.0-39.9, adult: Secondary | ICD-10-CM

## 2020-05-09 MED ORDER — CHLORTHALIDONE 25 MG PO TABS: 25.0000 mg | ORAL_TABLET | Freq: Every day | ORAL | 0 refills | Status: DC

## 2020-05-09 MED ORDER — VITAMIN D (ERGOCALCIFEROL) 1.25 MG (50000 UNIT) PO CAPS: 50000.0000 [IU] | ORAL_CAPSULE | ORAL | 0 refills | Status: DC

## 2020-05-09 MED ORDER — TRULICITY 3 MG/0.5ML ~~LOC~~ SOAJ: 3.0000 mg | SUBCUTANEOUS | 0 refills | Status: DC

## 2020-05-09 MED ORDER — DICLOFENAC SODIUM 75 MG PO TBEC: 75.0000 mg | DELAYED_RELEASE_TABLET | Freq: Two times a day (BID) | ORAL | 0 refills | Status: DC

## 2020-05-14 NOTE — Progress Notes (Signed)
Chief Complaint:   OBESITY Gail Crawford is here to discuss her progress with her obesity treatment plan along with follow-up of her obesity related diagnoses. Gail Crawford is on the Category 2 Plan and states she is following her eating plan approximately 50% of the time. Gail Crawford states she is doing water aerobics for 30 minutes 2 times per week.  Today's visit was #: 74 Starting weight: 266 lbs Starting date: 09/06/2019 Today's weight: 234 lbs Today's date: 05/09/2020 Total lbs lost to date: 32 lbs Total lbs lost since last in-office visit: 6 lbs Total weight loss percentage to date: -12.03%  Interim History: Gail Crawford is down 32 pounds today.  She says she is doing well and is happy with the plan.  She reports that she is doing better with staying on track and with accountability.   Assessment/Plan:   1. Hypertension associated with diabetes ([place]) Gail Crawford is working on healthy weight loss and exercise to improve blood pressure control. We will watch for signs of hypotension as she continues her lifestyle modifications.   BP Readings from Last 3 Encounters:  05/09/20 (!) 144/80  04/15/20 (!) 151/84  02/26/20 131/64   -Refill chlorthalidone (HYGROTON) 25 MG tablet; Take 1 tablet (25 mg total) by mouth daily.  Dispense: 90 tablet; Refill: 0  2. Type 2 diabetes mellitus with other specified complication, without long-term current use of insulin (HCC) Good blood sugar control is important to decrease the likelihood of diabetic complications such as nephropathy, neuropathy, limb loss, blindness, coronary artery disease, and death. Intensive lifestyle modification including diet, exercise and weight loss are the first line of treatment for diabetes.    Lab Results  Component Value Date   HGBA1C 6.3 (H) 10/09/2019   HGBA1C 6.0 (H) 05/25/2019   HGBA1C 5.8 (H) 01/03/2019   Lab Results  Component Value Date   LDLCALC 139 (H) 10/09/2019   CREATININE 0.80 10/09/2019   Lab Results    Component Value Date   INSULIN 8.6 05/25/2019   INSULIN 13.3 01/03/2019   INSULIN 15.8 09/05/2018   -Refill Dulaglutide (TRULICITY) 3 HU/7.6LY SOPN; Inject 3 mg as directed once a week.  Dispense: 6 mL; Refill: 0  3. Vitamin D deficiency Current vitamin D is 35.2, tested on 10/09/2019. Not at goal. Optimal goal > 50 ng/dL. There is also evidence to support a goal of >70 ng/dL in patients with cancer and heart disease. Plan: Continue Vitamin D @50 ,000 IU every week with follow-up for routine testing of Vitamin D at least 2-3 times per year to avoid over-replacement.  -Refill Vitamin D, Ergocalciferol, (DRISDOL) 1.25 MG (50000 UNIT) CAPS capsule; Take 1 capsule (50,000 Units total) by mouth every 7 (seven) days.  Dispense: 12 capsule; Refill: 0  4. Osteoarthritis of other site, unspecified osteoarthritis type Gail Crawford is taking diclofenac for her osteoarthritis.  Will refill today.  -Refill diclofenac (VOLTAREN) 75 MG EC tablet; Take 1 tablet (75 mg total) by mouth 2 (two) times daily.  Dispense: 180 tablet; Refill: 0  5. At risk for constipation Gail Crawford is at increased risk for constipation due to inadequate water intake, changes in diet, increasing her Trulicity dose.  6. Class 2 severe obesity with serious comorbidity and body mass index (BMI) of 39.0 to 39.9 in adult, unspecified obesity type [facility]) Gail Crawford is currently in the action stage of change. As such, her goal is to continue with weight loss efforts. She has agreed to the Category 2 Plan.   Exercise goals: For substantial health benefits, adults  should do at least 150 minutes (2 hours and 30 minutes) a week of moderate-intensity, or 75 minutes (1 hour and 15 minutes) a week of vigorous-intensity aerobic physical activity, or an equivalent combination of moderate- and vigorous-intensity aerobic activity. Aerobic activity should be performed in episodes of at least 10 minutes, and preferably, it should be spread throughout the  week.  Behavioral modification strategies: increasing lean protein intake, decreasing simple carbohydrates, increasing vegetables and increasing water intake.  Gail Crawford has agreed to follow-up with our clinic in 2-3 weeks. She was informed of the importance of frequent follow-up visits to maximize her success with intensive lifestyle modifications for her multiple health conditions.   Objective:   Blood pressure (!) 144/80, pulse 67, temperature 98.2 F (36.8 C), temperature source Oral, height 5\' 5"  (1.651 m), weight 234 lb (106.1 kg), SpO2 98 %. Body mass index is 38.94 kg/m.  General: Cooperative, alert, well developed, in no acute distress. HEENT: Conjunctivae and lids unremarkable. Cardiovascular: Regular rhythm.  Lungs: Normal work of breathing. Neurologic: No focal deficits.   Lab Results  Component Value Date   CREATININE 0.80 10/09/2019   BUN 17 10/09/2019   NA 140 10/09/2019   K 4.4 10/09/2019   CL 98 10/09/2019   CO2 24 10/09/2019   Lab Results  Component Value Date   ALT 13 10/09/2019   AST 20 10/09/2019   ALKPHOS 111 10/09/2019   BILITOT 0.4 10/09/2019   Lab Results  Component Value Date   HGBA1C 6.3 (H) 10/09/2019   HGBA1C 6.0 (H) 05/25/2019   HGBA1C 5.8 (H) 01/03/2019   HGBA1C 7.1 (H) 09/05/2018   Lab Results  Component Value Date   INSULIN 8.6 05/25/2019   INSULIN 13.3 01/03/2019   INSULIN 15.8 09/05/2018   Lab Results  Component Value Date   TSH 1.920 09/05/2018   Lab Results  Component Value Date   CHOL 226 (H) 10/09/2019   HDL 45 10/09/2019   LDLCALC 139 (H) 10/09/2019   TRIG 235 (H) 10/09/2019   CHOLHDL 5.0 (H) 10/09/2019   Lab Results  Component Value Date   WBC 9.2 10/09/2019   HGB 13.3 10/09/2019   HCT 39.9 10/09/2019   MCV 86 10/09/2019   PLT 211 10/09/2019   Attestation Statements:   Reviewed by clinician on day of visit: allergies, medications, problem list, medical history, surgical history, family history, social  history, and previous encounter notes.  I, Water quality scientist, CMA, am acting as transcriptionist for Briscoe Deutscher, DO  I have reviewed the above documentation for accuracy and completeness, and I agree with the above. Briscoe Deutscher, DO

## 2020-05-15 ENCOUNTER — Encounter (INDEPENDENT_AMBULATORY_CARE_PROVIDER_SITE_OTHER): Payer: Self-pay | Admitting: Family Medicine

## 2020-05-15 DIAGNOSIS — I152 Hypertension secondary to endocrine disorders: Secondary | ICD-10-CM

## 2020-05-16 MED ORDER — LOSARTAN POTASSIUM 50 MG PO TABS: 50.0000 mg | ORAL_TABLET | Freq: Every day | ORAL | 0 refills | Status: DC

## 2020-05-23 ENCOUNTER — Ambulatory Visit (INDEPENDENT_AMBULATORY_CARE_PROVIDER_SITE_OTHER): Payer: Managed Care, Other (non HMO) | Admitting: Family Medicine

## 2020-05-23 ENCOUNTER — Encounter (INDEPENDENT_AMBULATORY_CARE_PROVIDER_SITE_OTHER): Payer: Self-pay | Admitting: Family Medicine

## 2020-05-23 ENCOUNTER — Other Ambulatory Visit: Payer: Self-pay

## 2020-05-23 VITALS — BP 119/71 | HR 77 | Temp 98.0°F | Ht 65.0 in | Wt 234.0 lb

## 2020-05-23 DIAGNOSIS — E559 Vitamin D deficiency, unspecified: Secondary | ICD-10-CM

## 2020-05-23 DIAGNOSIS — Z6838 Body mass index (BMI) 38.0-38.9, adult: Secondary | ICD-10-CM

## 2020-05-23 DIAGNOSIS — E1169 Type 2 diabetes mellitus with other specified complication: Secondary | ICD-10-CM

## 2020-05-23 DIAGNOSIS — I10 Essential (primary) hypertension: Secondary | ICD-10-CM

## 2020-05-23 DIAGNOSIS — E1159 Type 2 diabetes mellitus with other circulatory complications: Secondary | ICD-10-CM

## 2020-05-23 MED ORDER — LOSARTAN POTASSIUM 50 MG PO TABS: 50.0000 mg | ORAL_TABLET | Freq: Every day | ORAL | 2 refills | Status: DC

## 2020-05-28 NOTE — Progress Notes (Signed)
Chief Complaint:   OBESITY Gail Crawford is here to discuss her progress with her obesity treatment plan along with follow-up of her obesity related diagnoses. [name] is on the Category 2 Plan and states she is following her eating plan approximately 50% of the time. [name] states she is exercising for 0 minutes 0 times per week.  Today's visit was #: 65 Starting weight: 266 lbs Starting date: 09/06/2019 Today's weight: 234 lbs Today's date: 05/23/2020 Total lbs lost to date: 32 lbs Total lbs lost since last in-office visit: 0 Total weight loss percentage to date: -12.03%  Interim History:  Today's bioimpedance results indicate that Gail Crawford has gained 3 pounds of water weight since her last visit. She says she has been working too much, helping out at evening events.  Her right knee is worsening, now also with pain at right IT and shin.  She endorses increased fatigue.  Assessment/Plan:   1. Type 2 diabetes mellitus with other specified complication, without long-term current use of insulin (HCC) Diabetes Mellitus: stable. Medication: Trulicity. Issues reviewed with her: blood sugar goals, complications of diabetes mellitus, hypoglycemia prevention and treatment, exercise, nutrition, and carbohydrate counting.   Lab Results  Component Value Date   HGBA1C 6.3 (H) 10/09/2019   HGBA1C 6.0 (H) 05/25/2019   HGBA1C 5.8 (H) 01/03/2019   Lab Results  Component Value Date   LDLCALC 139 (H) 10/09/2019   CREATININE 0.80 10/09/2019   2. Hypertension associated with diabetes ([place]) At goal. Medications: losartan and chlorthalidone. We will monitor for hypotension with continued weight loss.   BP Readings from Last 3 Encounters:  05/23/20 119/71  05/09/20 (!) 144/80  04/15/20 (!) 151/84   Lab Results  Component Value Date   CREATININE 0.80 10/09/2019   - losartan (COZAAR) 50 MG tablet; Take 1 tablet (50 mg total) by mouth daily.  Dispense: 90 tablet; Refill: 2  3. Vitamin D  deficiency Current vitamin D is 35.2, tested on 10/09/2019. Not at goal. Optimal goal > 50 ng/dL. There is also evidence to support a goal of >70 ng/dL in patients with cancer and heart disease. Plan: Continue Vitamin D @50 ,000 IU every week with follow-up for routine testing of Vitamin D at least 2-3 times per year to avoid over-replacement.  4. Class 2 severe obesity with serious comorbidity and body mass index (BMI) of 39.0 to 39.9 in adult, unspecified obesity type [facility])  Gail Crawford is currently in the action stage of change. As such, her goal is to continue with weight loss efforts. She has agreed to the Category 2 Plan.   Exercise goals: Get back to aquatic exercise.  Behavioral modification strategies: increasing lean protein intake and decreasing simple carbohydrates. Work on healthy boundary setting.  Wells has agreed to follow-up with our clinic in 3 weeks. She was informed of the importance of frequent follow-up visits to maximize her success with intensive lifestyle modifications for her multiple health conditions.   Objective:   Blood pressure 119/71, pulse 77, temperature 98 F (36.7 C), temperature source Oral, height 5\' 5"  (1.651 m), weight 234 lb (106.1 kg), SpO2 98 %. Body mass index is 38.94 kg/m.  General: Cooperative, alert, well developed, in no acute distress. HEENT: Conjunctivae and lids unremarkable. Cardiovascular: Regular rhythm.  Lungs: Normal work of breathing. Neurologic: No focal deficits.   Lab Results  Component Value Date   CREATININE 0.80 10/09/2019   BUN 17 10/09/2019   NA 140 10/09/2019   K 4.4 10/09/2019   CL 98  10/09/2019   CO2 24 10/09/2019   Lab Results  Component Value Date   ALT 13 10/09/2019   AST 20 10/09/2019   ALKPHOS 111 10/09/2019   BILITOT 0.4 10/09/2019   Lab Results  Component Value Date   HGBA1C 6.3 (H) 10/09/2019   HGBA1C 6.0 (H) 05/25/2019   HGBA1C 5.8 (H) 01/03/2019   HGBA1C 7.1 (H) 09/05/2018   Lab Results    Component Value Date   INSULIN 8.6 05/25/2019   INSULIN 13.3 01/03/2019   INSULIN 15.8 09/05/2018   Lab Results  Component Value Date   TSH 1.920 09/05/2018   Lab Results  Component Value Date   CHOL 226 (H) 10/09/2019   HDL 45 10/09/2019   LDLCALC 139 (H) 10/09/2019   TRIG 235 (H) 10/09/2019   CHOLHDL 5.0 (H) 10/09/2019   Lab Results  Component Value Date   WBC 9.2 10/09/2019   HGB 13.3 10/09/2019   HCT 39.9 10/09/2019   MCV 86 10/09/2019   PLT 211 10/09/2019   Attestation Statements:   Reviewed by clinician on day of visit: allergies, medications, problem list, medical history, surgical history, family history, social history, and previous encounter notes.  I, Water quality scientist, CMA, am acting as transcriptionist for Briscoe Deutscher, DO  I have reviewed the above documentation for accuracy and completeness, and I agree with the above. Briscoe Deutscher, DO

## 2020-06-13 ENCOUNTER — Ambulatory Visit (INDEPENDENT_AMBULATORY_CARE_PROVIDER_SITE_OTHER): Payer: Managed Care, Other (non HMO) | Admitting: Family Medicine

## 2020-06-13 ENCOUNTER — Other Ambulatory Visit: Payer: Self-pay

## 2020-06-13 ENCOUNTER — Encounter (INDEPENDENT_AMBULATORY_CARE_PROVIDER_SITE_OTHER): Payer: Self-pay | Admitting: Family Medicine

## 2020-06-13 VITALS — BP 147/74 | HR 65 | Temp 98.3°F | Ht 65.0 in | Wt 232.0 lb

## 2020-06-13 DIAGNOSIS — E1159 Type 2 diabetes mellitus with other circulatory complications: Secondary | ICD-10-CM

## 2020-06-13 DIAGNOSIS — E1169 Type 2 diabetes mellitus with other specified complication: Secondary | ICD-10-CM

## 2020-06-13 DIAGNOSIS — Z9189 Other specified personal risk factors, not elsewhere classified: Secondary | ICD-10-CM | POA: Diagnosis not present

## 2020-06-13 DIAGNOSIS — I152 Hypertension secondary to endocrine disorders: Secondary | ICD-10-CM | POA: Diagnosis not present

## 2020-06-13 DIAGNOSIS — E559 Vitamin D deficiency, unspecified: Secondary | ICD-10-CM

## 2020-06-17 NOTE — Progress Notes (Signed)
Chief Complaint:   OBESITY Gail Crawford is here to discuss her progress with her obesity treatment plan along with follow-up of her obesity related diagnoses. Gail Crawford is on the Category 2 Plan and states she is following her eating plan approximately 50% of the time. Gail Crawford states she is exercising for 0 minutes 0 times per week.  Today's visit was #: 77 Starting weight: 266 lbs Starting date: 09/06/2019 Today's weight: 232 lbs Today's date: 06/13/2020 Total lbs lost to date: 34 lbs Total lbs lost since last in-office visit: 2 lbs Total weight loss percentage to date: -12.78%  Interim History: Gail Crawford is down 34 pounds today.  She will see Dr. Melford Aase at the end of October for a CPE. Stress: Increased at work. Blood sugars: Stable.   Assessment/Plan:   1. Type 2 diabetes mellitus with other specified complication, without long-term current use of insulin (HCC) Diabetes Mellitus: stable. Medication: Trulicity. Issues reviewed with her: blood sugar goals, complications of diabetes mellitus, hypoglycemia prevention and treatment, exercise, nutrition, and carbohydrate counting.   Lab Results  Component Value Date   HGBA1C 6.3 (H) 10/09/2019   HGBA1C 6.0 (H) 05/25/2019   HGBA1C 5.8 (H) 01/03/2019   Lab Results  Component Value Date   LDLCALC 139 (H) 10/09/2019   CREATININE 0.80 10/09/2019   2. Vitamin D deficiency Current vitamin D is 35.2, tested on 10/09/2019. Not at goal. Optimal goal > 50 ng/dL. There is also evidence to support a goal of >70 ng/dL in patients with cancer and heart disease.   Plan: Continue Vitamin D @50 ,000 IU every week with follow-up for routine testing of Vitamin D at least 2-3 times per year to avoid over-replacement.  3. Hypertension associated with diabetes ([place]) Elevated today. Medications: Chlorthalidone 25 mg daily and Cozaar 50 mg daily. Plan: Monitor home BP. Diet: Avoid buying foods that are: processed, frozen, or prepackaged to avoid excess  salt. Recheck BP at next visit. The patient understands monitoring parameters and red flags.   BP Readings from Last 3 Encounters:  06/13/20 (!) 147/74  05/23/20 119/71  05/09/20 (!) 144/80   Lab Results  Component Value Date   CREATININE 0.80 10/09/2019   4. Class 2 severe obesity with serious comorbidity and body mass index (BMI) of 38.0 to 38.9 in adult, unspecified obesity type [facility])  Gail Crawford is currently in the action stage of change. As such, her goal is to continue with weight loss efforts. She has agreed to the Category 2 Plan.   Exercise goals: For substantial health benefits, adults should do at least 150 minutes (2 hours and 30 minutes) a week of moderate-intensity, or 75 minutes (1 hour and 15 minutes) a week of vigorous-intensity aerobic physical activity, or an equivalent combination of moderate- and vigorous-intensity aerobic activity. Aerobic activity should be performed in episodes of at least 10 minutes, and preferably, it should be spread throughout the week.  Behavioral modification strategies: increasing lean protein intake, decreasing simple carbohydrates, increasing vegetables and increasing water intake.  Gail Crawford has agreed to follow-up with our clinic in 3 weeks. She was informed of the importance of frequent follow-up visits to maximize her success with intensive lifestyle modifications for her multiple health conditions.   Objective:   Blood pressure (!) 147/74, pulse 65, temperature 98.3 F (36.8 C), temperature source Oral, height 5\' 5"  (1.651 m), weight 232 lb (105.2 kg), SpO2 99 %. Body mass index is 38.61 kg/m.  General: Cooperative, alert, well developed, in no acute distress. HEENT: Conjunctivae  and lids unremarkable. Cardiovascular: Regular rhythm.  Lungs: Normal work of breathing. Neurologic: No focal deficits.   Lab Results  Component Value Date   CREATININE 0.80 10/09/2019   BUN 17 10/09/2019   NA 140 10/09/2019   K 4.4 10/09/2019   CL  98 10/09/2019   CO2 24 10/09/2019   Lab Results  Component Value Date   ALT 13 10/09/2019   AST 20 10/09/2019   ALKPHOS 111 10/09/2019   BILITOT 0.4 10/09/2019   Lab Results  Component Value Date   HGBA1C 6.3 (H) 10/09/2019   HGBA1C 6.0 (H) 05/25/2019   HGBA1C 5.8 (H) 01/03/2019   HGBA1C 7.1 (H) 09/05/2018   Lab Results  Component Value Date   INSULIN 8.6 05/25/2019   INSULIN 13.3 01/03/2019   INSULIN 15.8 09/05/2018   Lab Results  Component Value Date   TSH 1.920 09/05/2018   Lab Results  Component Value Date   CHOL 226 (H) 10/09/2019   HDL 45 10/09/2019   LDLCALC 139 (H) 10/09/2019   TRIG 235 (H) 10/09/2019   CHOLHDL 5.0 (H) 10/09/2019   Lab Results  Component Value Date   WBC 9.2 10/09/2019   HGB 13.3 10/09/2019   HCT 39.9 10/09/2019   MCV 86 10/09/2019   PLT 211 10/09/2019   Obesity Behavioral Intervention:   Approximately 15 minutes were spent on the discussion below.  ASK: We discussed the diagnosis of obesity with Gail Crawford today and Gail Crawford agreed to give [place] permission to discuss obesity behavioral modification therapy today.  ASSESS: Gail Crawford has the diagnosis of obesity and her BMI today is 38.7. Gail Crawford is in the action stage of change.   ADVISE: Gail Crawford was educated on the multiple health risks of obesity as well as the benefit of weight loss to improve her health. She was advised of the need for long term treatment and the importance of lifestyle modifications to improve her current health and to decrease her risk of future health problems.  AGREE: Multiple dietary modification options and treatment options were discussed and Gail Crawford agreed to follow the recommendations documented in the above note.  ARRANGE: Gail Crawford was educated on the importance of frequent visits to treat obesity as outlined per CMS and USPSTF guidelines and agreed to schedule her next follow up appointment today.  Attestation Statements:   Reviewed by  clinician on day of visit: allergies, medications, problem list, medical history, surgical history, family history, social history, and previous encounter notes.  I, Water quality scientist, CMA, am acting as transcriptionist for Gail Deutscher, DO  I have reviewed the above documentation for accuracy and completeness, and I agree with the above. Gail Deutscher, DO

## 2020-07-04 ENCOUNTER — Ambulatory Visit (INDEPENDENT_AMBULATORY_CARE_PROVIDER_SITE_OTHER): Payer: Managed Care, Other (non HMO) | Admitting: Family Medicine

## 2020-07-28 ENCOUNTER — Encounter (INDEPENDENT_AMBULATORY_CARE_PROVIDER_SITE_OTHER): Payer: Self-pay | Admitting: Family Medicine

## 2020-07-28 NOTE — Progress Notes (Deleted)
Chief Complaint:   OBESITY Gail Crawford is here to discuss her progress with her obesity treatment plan along with follow-up of her obesity related diagnoses.   Today's visit was #: *** Starting weight: *** Starting date: *** Today's weight: *** Today's date: 07/28/2020 Total lbs lost to date: *** There is no height or weight on file to calculate BMI.  Total weight loss percentage to date: ***  Interim History: ***  Nutrition Plan: {MWMwtlossportion/plan2:23431}.  Anti-obesity medications: ***. Reported side effects: ***. Hunger is {EWCONTROLASSESSMENT:24261} controlled. Cravings are {EWCONTROLASSESSMENT:24261} controlled.  Activity: *** Sleep: Number of hours slept each night: ***. Sleep {ACTION; IS/IS NOT:21021397} restful.  Stress: ***.  Assessment/Plan:   ***  Course: Gail Crawford is currently in the action stage of change. As such, her goal is to continue with weight loss efforts.   Nutrition goals: She has agreed to {MWMwtlossportion/plan2:23431}.   Exercise goals: {MWM EXERCISE RECS:23473}  Behavioral modification strategies: {MWMwtlossdietstrategies3:23432}.  Gail Crawford has agreed to follow-up with our clinic in {NUMBER 1-10:22536} weeks. She was informed of the importance of frequent follow-up visits to maximize her success with intensive lifestyle modifications for her multiple health conditions.   ***delete paragraph if no labs orderedCatherine was informed we would discuss her lab results at her next visit unless there is a critical issue that needs to be addressed sooner. Gail Crawford agreed to keep her next visit at the agreed upon time to discuss these results.  Objective:   There were no vitals taken for this visit. There is no height or weight on file to calculate BMI.  General: Cooperative, alert, well developed, in no acute distress. HEENT: Conjunctivae and lids unremarkable. Cardiovascular: Regular rhythm.  Lungs: Normal work of breathing. Neurologic:  No focal deficits.   Lab Results  Component Value Date   CREATININE 0.80 10/09/2019   BUN 17 10/09/2019   NA 140 10/09/2019   K 4.4 10/09/2019   CL 98 10/09/2019   CO2 24 10/09/2019   Lab Results  Component Value Date   ALT 13 10/09/2019   AST 20 10/09/2019   ALKPHOS 111 10/09/2019   BILITOT 0.4 10/09/2019   Lab Results  Component Value Date   HGBA1C 6.3 (H) 10/09/2019   HGBA1C 6.0 (H) 05/25/2019   HGBA1C 5.8 (H) 01/03/2019   HGBA1C 7.1 (H) 09/05/2018   Lab Results  Component Value Date   INSULIN 8.6 05/25/2019   INSULIN 13.3 01/03/2019   INSULIN 15.8 09/05/2018   Lab Results  Component Value Date   TSH 1.920 09/05/2018   Lab Results  Component Value Date   CHOL 226 (H) 10/09/2019   HDL 45 10/09/2019   LDLCALC 139 (H) 10/09/2019   TRIG 235 (H) 10/09/2019   CHOLHDL 5.0 (H) 10/09/2019   Lab Results  Component Value Date   WBC 9.2 10/09/2019   HGB 13.3 10/09/2019   HCT 39.9 10/09/2019   MCV 86 10/09/2019   PLT 211 10/09/2019   No results found for: IRON, TIBC, FERRITIN  Obesity Behavioral Intervention:   Approximately 15 minutes were spent on the discussion below.  ASK: We discussed the diagnosis of obesity with Gail Crawford today and Gail Crawford agreed to give [place] permission to discuss obesity behavioral modification therapy today.  ASSESS: Gail Crawford has the diagnosis of obesity and her BMI today is ***. Gail Crawford {ACTION; IS/IS QIH:[id number]} in the action stage of change.   ADVISE: Gail Crawford was educated on the multiple health risks of obesity as well as the benefit of weight loss to improve her  health. She was advised of the need for long term treatment and the importance of lifestyle modifications to improve her current health and to decrease her risk of future health problems.  AGREE: Multiple dietary modification options and treatment options were discussed and Gail Crawford agreed to follow the recommendations documented in the above  note.  ARRANGE: Gail Crawford was educated on the importance of frequent visits to treat obesity as outlined per CMS and USPSTF guidelines and agreed to schedule her next follow up appointment today.  Attestation Statements:   Reviewed by clinician on day of visit: allergies, medications, problem list, medical history, surgical history, family history, social history, and previous encounter notes.  ***(delete if time-based billing not used)Time spent on visit including pre-visit chart review and post-visit care and charting was *** minutes.   I, ***, am acting as transcriptionist for ***.  I have reviewed the above documentation for accuracy and completeness, and I agree with the above. -  ***

## 2020-07-29 ENCOUNTER — Ambulatory Visit (INDEPENDENT_AMBULATORY_CARE_PROVIDER_SITE_OTHER): Payer: Medicare Other | Admitting: Family Medicine

## 2020-08-05 ENCOUNTER — Other Ambulatory Visit: Payer: Self-pay

## 2020-08-05 ENCOUNTER — Ambulatory Visit (INDEPENDENT_AMBULATORY_CARE_PROVIDER_SITE_OTHER): Payer: Managed Care, Other (non HMO) | Admitting: Family Medicine

## 2020-08-05 ENCOUNTER — Encounter (INDEPENDENT_AMBULATORY_CARE_PROVIDER_SITE_OTHER): Payer: Self-pay | Admitting: Family Medicine

## 2020-08-05 VITALS — BP 133/58 | HR 71 | Temp 98.2°F | Ht 65.0 in | Wt 230.0 lb

## 2020-08-05 DIAGNOSIS — E8881 Metabolic syndrome: Secondary | ICD-10-CM | POA: Diagnosis not present

## 2020-08-05 DIAGNOSIS — Z6838 Body mass index (BMI) 38.0-38.9, adult: Secondary | ICD-10-CM | POA: Diagnosis not present

## 2020-08-05 DIAGNOSIS — E785 Hyperlipidemia, unspecified: Secondary | ICD-10-CM

## 2020-08-05 DIAGNOSIS — I152 Hypertension secondary to endocrine disorders: Secondary | ICD-10-CM | POA: Diagnosis not present

## 2020-08-05 DIAGNOSIS — E1169 Type 2 diabetes mellitus with other specified complication: Secondary | ICD-10-CM | POA: Diagnosis not present

## 2020-08-05 DIAGNOSIS — E1159 Type 2 diabetes mellitus with other circulatory complications: Secondary | ICD-10-CM | POA: Diagnosis not present

## 2020-08-06 DIAGNOSIS — E8881 Metabolic syndrome: Secondary | ICD-10-CM | POA: Insufficient documentation

## 2020-08-06 NOTE — Progress Notes (Signed)
Chief Complaint:   OBESITY Gail Crawford is here to discuss her progress with her obesity treatment plan along with follow-up of her obesity related diagnoses.   Today's visit was #: 21 Starting weight: 266 lbs Starting date: 09/06/2019 Today's weight: 230 lbs Today's date: 08/05/2020 Total lbs lost to date: 36 lbs Body mass index is 38.27 kg/m.  Total weight loss percentage to date: -13.53%  Interim History: Gail Crawford is very happy about her 2 pound weight loss.  She enjoyed Thanksgiving and afterwards went to [place] for 5 days.  She was also sick for a few days just after our last visit.  She is still finding that her portions are more controlled with the Trulicity.  She would like to meet back in 4 weeks just after the new year.  She says that she is ready to get back on track.  Nutrition Plan: the Category 2 Plan for 25% of the time.  Anti-obesity medications: Trulicity 3 mg subcutaneously weekly.  Hunger is well controlled. Cravings are well controlled. Activity: Walking for 30 minutes 3 times per week.  Assessment/Plan:   1. Type 2 diabetes mellitus with other specified complication, without long-term current use of insulin (HCC) Diabetes Mellitus: Improving. Medication: Trulicity. Issues reviewed with her: blood sugar goals, complications of diabetes mellitus, hypoglycemia prevention and treatment, exercise, and nutrition.  Lab Results  Component Value Date   HGBA1C 6.3 (H) 10/09/2019   HGBA1C 6.0 (H) 05/25/2019   HGBA1C 5.8 (H) 01/03/2019   Lab Results  Component Value Date   LDLCALC 139 (H) 10/09/2019   CREATININE 0.80 10/09/2019   2. Hypertension associated with diabetes ([place]) Not optimized. Medications: Cozaar 50 mg daily.   Plan: Avoid buying foods that are: processed, frozen, or prepackaged to avoid excess salt. We will continue to monitor symptoms as they relate to her weight loss journey.  BP Readings from Last 3 Encounters:  08/05/20 (!) 133/58   06/13/20 (!) 147/74  05/23/20 119/71   Lab Results  Component Value Date   CREATININE 0.80 10/09/2019   3. Hyperlipidemia associated with type 2 diabetes mellitus ([place]), goal < 70, with statin-induced myalgias, Lipid Clinic has been offered  Plan: Dietary changes: Increase soluble fiber. Decrease simple carbohydrates. Exercise changes: An average 40 minutes of moderate to vigorous-intensity aerobic activity 3 or 4 times per week.   Lab Results  Component Value Date   CHOL 226 (H) 10/09/2019   HDL 45 10/09/2019   LDLCALC 139 (H) 10/09/2019   TRIG 235 (H) 10/09/2019   CHOLHDL 5.0 (H) 10/09/2019   Lab Results  Component Value Date   ALT 13 10/09/2019   AST 20 10/09/2019   ALKPHOS 111 10/09/2019   BILITOT 0.4 10/09/2019   The 10-year ASCVD risk score [name], et al., 2013) is: 18.1%   Values used to calculate the score:     Age: 64 years     Sex: Female     Is Non-Hispanic African American: No     Diabetic: Yes     Tobacco smoker: No     Systolic Blood Pressure: 794 mmHg     Is BP treated: Yes     HDL Cholesterol: 45 mg/dL     Total Cholesterol: 250 mg/dL  4. Metabolic syndrome Starting goal: Lose 7-10% of starting weight. She will continue to focus on protein-rich, low simple carbohydrate foods. We reviewed the importance of hydration, regular exercise for stress reduction, and restorative sleep.  We will continue to  check lab work every 3 months, with 10% weight loss, or should any other concerns arise.  5. Class 2 severe obesity with serious comorbidity and body mass index (BMI) of 38.0 to 38.9 in adult, unspecified obesity type ([place])  Course: Gail Crawford is currently in the action stage of change. As such, her goal is to continue with weight loss efforts.   Nutrition goals: She has agreed to the Category 2 Plan.   Exercise goals: For substantial health benefits, adults should do at least 150 minutes (2 hours and 30 minutes) a week of moderate-intensity, or 75  minutes (1 hour and 15 minutes) a week of vigorous-intensity aerobic physical activity, or an equivalent combination of moderate- and vigorous-intensity aerobic activity. Aerobic activity should be performed in episodes of at least 10 minutes, and preferably, it should be spread throughout the week.  Behavioral modification strategies: increasing lean protein intake, decreasing simple carbohydrates, increasing vegetables, increasing water intake and holiday eating strategies .  Gail Crawford has agreed to follow-up with our clinic in 4 weeks. She was informed of the importance of frequent follow-up visits to maximize her success with intensive lifestyle modifications for her multiple health conditions.   Objective:   Blood pressure (!) 133/58, pulse 71, temperature 98.2 F (36.8 C), temperature source Oral, height 5\' 5"  (1.651 m), weight 230 lb (104.3 kg), SpO2 97 %. Body mass index is 38.27 kg/m.  General: Cooperative, alert, well developed, in no acute distress. HEENT: Conjunctivae and lids unremarkable. Cardiovascular: Regular rhythm.  Lungs: Normal work of breathing. Neurologic: No focal deficits.   Lab Results  Component Value Date   CREATININE 0.80 10/09/2019   BUN 17 10/09/2019   NA 140 10/09/2019   K 4.4 10/09/2019   CL 98 10/09/2019   CO2 24 10/09/2019   Lab Results  Component Value Date   ALT 13 10/09/2019   AST 20 10/09/2019   ALKPHOS 111 10/09/2019   BILITOT 0.4 10/09/2019   Lab Results  Component Value Date   HGBA1C 6.3 (H) 10/09/2019   HGBA1C 6.0 (H) 05/25/2019   HGBA1C 5.8 (H) 01/03/2019   HGBA1C 7.1 (H) 09/05/2018   Lab Results  Component Value Date   INSULIN 8.6 05/25/2019   INSULIN 13.3 01/03/2019   INSULIN 15.8 09/05/2018   Lab Results  Component Value Date   TSH 1.920 09/05/2018   Lab Results  Component Value Date   CHOL 226 (H) 10/09/2019   HDL 45 10/09/2019   LDLCALC 139 (H) 10/09/2019   TRIG 235 (H) 10/09/2019   CHOLHDL 5.0 (H) 10/09/2019    Lab Results  Component Value Date   WBC 9.2 10/09/2019   HGB 13.3 10/09/2019   HCT 39.9 10/09/2019   MCV 86 10/09/2019   PLT 211 10/09/2019   Obesity Behavioral Intervention:   Approximately 15 minutes were spent on the discussion below.  ASK: We discussed the diagnosis of obesity with Gail Crawford today and Gail Crawford agreed to give [place] permission to discuss obesity behavioral modification therapy today.  ASSESS: Jewel has the diagnosis of obesity and her BMI today is 38.4. Gail Crawford is in the action stage of change.   ADVISE: Gail Crawford was educated on the multiple health risks of obesity as well as the benefit of weight loss to improve her health. She was advised of the need for long term treatment and the importance of lifestyle modifications to improve her current health and to decrease her risk of future health problems.  AGREE: Multiple dietary modification options and treatment options were  discussed and Gail Crawford agreed to follow the recommendations documented in the above note.  ARRANGE: Gail Crawford was educated on the importance of frequent visits to treat obesity as outlined per CMS and USPSTF guidelines and agreed to schedule her next follow up appointment today.  Attestation Statements:   Reviewed by clinician on day of visit: allergies, medications, problem list, medical history, surgical history, family history, social history, and previous encounter notes.  I, Water quality scientist, CMA, am acting as transcriptionist for Briscoe Deutscher, DO  I have reviewed the above documentation for accuracy and completeness, and I agree with the above. Briscoe Deutscher, DO

## 2020-08-26 ENCOUNTER — Encounter (INDEPENDENT_AMBULATORY_CARE_PROVIDER_SITE_OTHER): Payer: Self-pay | Admitting: Family Medicine

## 2020-08-26 DIAGNOSIS — E559 Vitamin D deficiency, unspecified: Secondary | ICD-10-CM

## 2020-08-26 DIAGNOSIS — I152 Hypertension secondary to endocrine disorders: Secondary | ICD-10-CM

## 2020-08-26 DIAGNOSIS — E1169 Type 2 diabetes mellitus with other specified complication: Secondary | ICD-10-CM

## 2020-08-26 MED ORDER — TRULICITY 3 MG/0.5ML ~~LOC~~ SOAJ: 3.0000 mg | SUBCUTANEOUS | 0 refills | Status: DC

## 2020-08-26 MED ORDER — VITAMIN D (ERGOCALCIFEROL) 1.25 MG (50000 UNIT) PO CAPS: 50000.0000 [IU] | ORAL_CAPSULE | ORAL | 0 refills | Status: DC

## 2020-08-26 MED ORDER — CHLORTHALIDONE 25 MG PO TABS: 25.0000 mg | ORAL_TABLET | Freq: Every day | ORAL | 0 refills | Status: DC

## 2020-09-04 ENCOUNTER — Other Ambulatory Visit: Payer: Self-pay

## 2020-09-04 ENCOUNTER — Encounter (INDEPENDENT_AMBULATORY_CARE_PROVIDER_SITE_OTHER): Payer: Self-pay

## 2020-09-04 ENCOUNTER — Ambulatory Visit (INDEPENDENT_AMBULATORY_CARE_PROVIDER_SITE_OTHER): Payer: Managed Care, Other (non HMO) | Admitting: Family Medicine

## 2020-09-04 ENCOUNTER — Encounter (INDEPENDENT_AMBULATORY_CARE_PROVIDER_SITE_OTHER): Payer: Self-pay | Admitting: Family Medicine

## 2020-09-04 VITALS — BP 143/77 | HR 76 | Temp 98.5°F | Ht 65.0 in | Wt 229.0 lb

## 2020-09-04 DIAGNOSIS — E559 Vitamin D deficiency, unspecified: Secondary | ICD-10-CM | POA: Diagnosis not present

## 2020-09-04 DIAGNOSIS — E1169 Type 2 diabetes mellitus with other specified complication: Secondary | ICD-10-CM

## 2020-09-04 DIAGNOSIS — M1711 Unilateral primary osteoarthritis, right knee: Secondary | ICD-10-CM

## 2020-09-04 DIAGNOSIS — Z6838 Body mass index (BMI) 38.0-38.9, adult: Secondary | ICD-10-CM

## 2020-09-04 DIAGNOSIS — Z9189 Other specified personal risk factors, not elsewhere classified: Secondary | ICD-10-CM

## 2020-09-04 DIAGNOSIS — R0602 Shortness of breath: Secondary | ICD-10-CM

## 2020-09-04 DIAGNOSIS — E785 Hyperlipidemia, unspecified: Secondary | ICD-10-CM

## 2020-09-04 DIAGNOSIS — E1159 Type 2 diabetes mellitus with other circulatory complications: Secondary | ICD-10-CM

## 2020-09-04 DIAGNOSIS — M1909 Primary osteoarthritis, other specified site: Secondary | ICD-10-CM

## 2020-09-04 DIAGNOSIS — I152 Hypertension secondary to endocrine disorders: Secondary | ICD-10-CM

## 2020-09-04 MED ORDER — VITAMIN D (ERGOCALCIFEROL) 1.25 MG (50000 UNIT) PO CAPS: 50000.0000 [IU] | ORAL_CAPSULE | ORAL | 0 refills | Status: DC

## 2020-09-04 MED ORDER — CHLORTHALIDONE 25 MG PO TABS: 25.0000 mg | ORAL_TABLET | Freq: Every day | ORAL | 0 refills | Status: DC

## 2020-09-04 MED ORDER — TRULICITY 3 MG/0.5ML ~~LOC~~ SOAJ: 3.0000 mg | SUBCUTANEOUS | 0 refills | Status: DC

## 2020-09-04 MED ORDER — LOSARTAN POTASSIUM 50 MG PO TABS: 50.0000 mg | ORAL_TABLET | Freq: Every day | ORAL | 0 refills | Status: DC

## 2020-09-04 MED ORDER — DICLOFENAC SODIUM 75 MG PO TBEC: 75.0000 mg | DELAYED_RELEASE_TABLET | Freq: Two times a day (BID) | ORAL | 0 refills | Status: DC

## 2020-09-05 LAB — MICROALBUMIN / CREATININE URINE RATIO
Creatinine, Urine: 116.7 mg/dL
Microalb/Creat Ratio: 3 mg/g creat (ref 0–29)
Microalbumin, Urine: 3.6 ug/mL

## 2020-09-05 LAB — CBC WITH DIFFERENTIAL/PLATELET
Basophils Absolute: 0 10*3/uL (ref 0.0–0.2)
Basos: 0 %
EOS (ABSOLUTE): 0.1 10*3/uL (ref 0.0–0.4)
Eos: 1 %
Hemoglobin: 13 g/dL (ref 11.1–15.9)
Immature Grans (Abs): 0 10*3/uL (ref 0.0–0.1)
Immature Granulocytes: 0 %
Lymphocytes Absolute: 2.4 10*3/uL (ref 0.7–3.1)
Lymphs: 28 %
MCH: 28.3 pg (ref 26.6–33.0)
MCHC: 32.1 g/dL (ref 31.5–35.7)
MCV: 88 fL (ref 79–97)
Monocytes Absolute: 0.6 10*3/uL (ref 0.1–0.9)
Monocytes: 7 %
Neutrophils Absolute: 5.6 10*3/uL (ref 1.4–7.0)
Neutrophils: 64 %
Platelets: 283 10*3/uL (ref 150–450)
RBC: 4.6 x10E6/uL (ref 3.77–5.28)
RDW: 12.1 % (ref 11.7–15.4)
WBC: 8.7 10*3/uL (ref 3.4–10.8)

## 2020-09-05 LAB — COMPREHENSIVE METABOLIC PANEL
ALT: 9 IU/L (ref 0–32)
AST: 15 IU/L (ref 0–40)
Albumin/Globulin Ratio: 1.6 (ref 1.2–2.2)
Albumin: 4.2 g/dL (ref 3.8–4.8)
Alkaline Phosphatase: 107 IU/L (ref 44–121)
BUN/Creatinine Ratio: 18 (ref 12–28)
BUN: 15 mg/dL (ref 8–27)
Bilirubin Total: 0.4 mg/dL (ref 0.0–1.2)
CO2: 26 mmol/L (ref 20–29)
Calcium: 9.2 mg/dL (ref 8.7–10.3)
Chloride: 99 mmol/L (ref 96–106)
Creatinine, Ser: 0.85 mg/dL (ref 0.57–1.00)
GFR calc Af Amer: 83 mL/min/{1.73_m2} (ref >59)
GFR calc non Af Amer: 72 mL/min/{1.73_m2} (ref >59)
Globulin, Total: 2.7 g/dL (ref 1.5–4.5)
Glucose: 95 mg/dL (ref 65–99)
Potassium: 3.8 mmol/L (ref 3.5–5.2)
Sodium: 141 mmol/L (ref 134–144)
Total Protein: 6.9 g/dL (ref 6.0–8.5)

## 2020-09-05 LAB — T4, FREE: Free T4: 1.14 ng/dL (ref 0.82–1.77)

## 2020-09-05 LAB — LIPID PANEL
Chol/HDL Ratio: 5.9 ratio — ABNORMAL HIGH (ref 0.0–4.4)
Cholesterol, Total: 230 mg/dL — ABNORMAL HIGH (ref 100–199)
HDL: 39 mg/dL — ABNORMAL LOW (ref >39)
LDL Chol Calc (NIH): 148 mg/dL — ABNORMAL HIGH (ref 0–99)
Triglycerides: 237 mg/dL — ABNORMAL HIGH (ref 0–149)
VLDL Cholesterol Cal: 43 mg/dL — ABNORMAL HIGH (ref 5–40)

## 2020-09-05 LAB — HEMOGLOBIN A1C
Est. average glucose Bld gHb Est-mCnc: 117 mg/dL
Hgb A1c MFr Bld: 5.7 % — ABNORMAL HIGH (ref 4.8–5.6)

## 2020-09-05 LAB — ANEMIA PANEL
Ferritin: 287 ng/mL — ABNORMAL HIGH (ref 15–150)
Folate, Hemolysate: 467 ng/mL
Folate, RBC: 1153 ng/mL (ref >498)
Hematocrit: 40.5 % (ref 34.0–46.6)
Iron Saturation: 14 % — ABNORMAL LOW (ref 15–55)
Iron: 37 ug/dL (ref 27–139)
Retic Ct Pct: 1.5 % (ref 0.6–2.6)
Total Iron Binding Capacity: 259 ug/dL (ref 250–450)
UIBC: 222 ug/dL (ref 118–369)
Vitamin B-12: 511 pg/mL (ref 232–1245)

## 2020-09-05 LAB — VITAMIN D 25 HYDROXY (VIT D DEFICIENCY, FRACTURES): Vit D, 25-Hydroxy: 37.4 ng/mL (ref 30.0–100.0)

## 2020-09-05 LAB — TSH: TSH: 2.18 u[IU]/mL (ref 0.450–4.500)

## 2020-09-05 LAB — INSULIN, RANDOM: INSULIN: 23 u[IU]/mL (ref 2.6–24.9)

## 2020-09-05 NOTE — Progress Notes (Signed)
Chief Complaint:   OBESITY Gail Crawford is here to discuss her progress with her obesity treatment plan along with follow-up of her obesity related diagnoses.   Today's visit was #: 65 Starting weight: 266 lbs Starting date: 09/06/2019 Today's weight: 229 lbs Today's date: 09/04/2020 Total lbs lost to date: 37 lbs Body mass index is 38.11 kg/m.  Total weight loss percentage to date: -13.91%  Interim History: Gail Crawford says that school is back in, so her schedule has been much busier. Repeat IC was 1176. Nutrition Plan: the Category 2 Plan for 25% of the time.  Anti-obesity medications: Trulicity 3 mg subcutaneously weekly. Reported side effects: None. Hunger is well controlled. Cravings are well controlled.  Activity: None at this time.  Assessment/Plan:   1. Type 2 diabetes mellitus with other specified complication, without long-term current use of insulin (HCC) Diabetes Mellitus: At goal.  Medication: Trulicity 3 mg subcutaneously weekly. Issues reviewed with her: blood sugar goals, complications of diabetes mellitus, hypoglycemia prevention and treatment, exercise, and nutrition.  Lab Results  Component Value Date   HGBA1C 5.7 (H) 09/04/2020   HGBA1C 6.3 (H) 10/09/2019   HGBA1C 6.0 (H) 05/25/2019   Lab Results  Component Value Date   LDLCALC 148 (H) 09/04/2020   CREATININE 0.85 09/04/2020   - Refill Dulaglutide (TRULICITY) 3 0000000 SOPN; Inject 3 mg as directed once a week.  Dispense: 6 mL; Refill: 0  2. Vitamin D deficiency Not at goal. Current vitamin D is 35.2, tested on 10/09/2019.    Plan:  [x]   Continue Vitamin D @50 ,000 IU every week. []   Continue home supplement daily. [x]   Follow-up for routine testing of Vitamin D at least 2-3 times per year to avoid over-replacement.  - Refill Vitamin D, Ergocalciferol, (DRISDOL) 1.25 MG (50000 UNIT) CAPS capsule; Take 1 capsule (50,000 Units total) by mouth every 7 (seven) days.  Dispense: 4 capsule; Refill: 0  3.  Hypertension associated with diabetes ([place]) Elevated today. Medications: chlorthalidone 25 mg daily, losartan 50 mg daily.   Plan: Avoid buying foods that are: processed, frozen, or prepackaged to avoid excess salt. We will continue to monitor symptoms as they relate to her weight loss journey.  BP Readings from Last 3 Encounters:  09/04/20 (!) 143/77  08/05/20 (!) 133/58  06/13/20 (!) 147/74   Lab Results  Component Value Date   CREATININE 0.85 09/04/2020   - Refill losartan (COZAAR) 50 MG tablet; Take 1 tablet (50 mg total) by mouth daily.  Dispense: 90 tablet; Refill: 0 - Refill chlorthalidone (HYGROTON) 25 MG tablet; Take 1 tablet (25 mg total) by mouth daily.  Dispense: 90 tablet; Refill: 0  4. Hyperlipidemia associated with type 2 diabetes mellitus ([place]) Plan: Dietary changes: Increase soluble fiber. Decrease simple carbohydrates. Exercise changes: An average 40 minutes of moderate to vigorous-intensity aerobic activity 3 or 4 times per week.   Lab Results  Component Value Date   CHOL 230 (H) 09/04/2020   HDL 39 (L) 09/04/2020   LDLCALC 148 (H) 09/04/2020   TRIG 237 (H) 09/04/2020   CHOLHDL 5.9 (H) 09/04/2020   Lab Results  Component Value Date   ALT 9 09/04/2020   AST 15 09/04/2020   ALKPHOS 107 09/04/2020   BILITOT 0.4 09/04/2020   The 10-year ASCVD risk score [name], et al., 2013) is: 22.6%   Values used to calculate the score:     Age: 65 years     Sex: Female     Is  Non-Hispanic African American: No     Diabetic: Yes     Tobacco smoker: No     Systolic Blood Pressure: 143 mmHg     Is BP treated: Yes     HDL Cholesterol: 39 mg/dL     Total Cholesterol: 230 mg/dL  5. Primary osteoarthritis of right knee Gail Crawford is taking diclofenac for her osteoarthritis.  Refill provided today as courtesy, patient requested.  - Refill diclofenac (VOLTAREN) 75 MG EC tablet; Take 1 tablet (75 mg total) by mouth 2 (two) times daily.  Dispense: 180 tablet; Refill:  0  6. Shortness of breath on exertion IC today showed RMR of 1176. Plan:  Will send Category 1 Meal Plan pdf to patient through MyChart.  7. At risk for activity intolerance Gail Crawford was given approximately 10 minutes of exercise intolerance counseling today. She is 65 y.o. female and has risk factors exercise intolerance including obesity and diabetes. We re-discussed intensive lifestyle modifications today with an emphasis on specific weight loss instructions and strategies. Gail Crawford will slowly increase activity as tolerated.  8. Class 2 severe obesity with serious comorbidity and body mass index (BMI) of 38.0 to 38.9 in adult, unspecified obesity type (HCC)  Course: Gail Crawford is currently in the action stage of change. As such, her goal is to continue with weight loss efforts.   Nutrition goals: She has agreed to the Category 1 Plan.   Exercise goals: All adults should avoid inactivity. Some physical activity is better than none, and adults who participate in any amount of physical activity gain some health benefits.  Behavioral modification strategies: increasing lean protein intake, decreasing simple carbohydrates, increasing vegetables and increasing water intake.  Gail Crawford has agreed to follow-up with our clinic in 3-4 weeks. She was informed of the importance of frequent follow-up visits to maximize her success with intensive lifestyle modifications for her multiple health conditions.   Objective:   Blood pressure (!) 143/77, pulse 76, temperature 98.5 F (36.9 C), temperature source Oral, height 5\' 5"  (1.651 m), weight 229 lb (103.9 kg), SpO2 98 %. Body mass index is 38.11 kg/m.  General: Cooperative, alert, well developed, in no acute distress. HEENT: Conjunctivae and lids unremarkable. Cardiovascular: Regular rhythm.  Lungs: Normal work of breathing. Neurologic: No focal deficits.   Lab Results  Component Value Date   CREATININE 0.85 09/04/2020   BUN 15 09/04/2020    NA 141 09/04/2020   K 3.8 09/04/2020   CL 99 09/04/2020   CO2 26 09/04/2020   Lab Results  Component Value Date   ALT 9 09/04/2020   AST 15 09/04/2020   ALKPHOS 107 09/04/2020   BILITOT 0.4 09/04/2020   Lab Results  Component Value Date   HGBA1C 5.7 (H) 09/04/2020   HGBA1C 6.3 (H) 10/09/2019   HGBA1C 6.0 (H) 05/25/2019   HGBA1C 5.8 (H) 01/03/2019   HGBA1C 7.1 (H) 09/05/2018   Lab Results  Component Value Date   INSULIN 23.0 09/04/2020   INSULIN 8.6 05/25/2019   INSULIN 13.3 01/03/2019   INSULIN 15.8 09/05/2018   Lab Results  Component Value Date   TSH 2.180 09/04/2020   Lab Results  Component Value Date   CHOL 230 (H) 09/04/2020   HDL 39 (L) 09/04/2020   LDLCALC 148 (H) 09/04/2020   TRIG 237 (H) 09/04/2020   CHOLHDL 5.9 (H) 09/04/2020   Lab Results  Component Value Date   WBC 8.7 09/04/2020   HGB 13.0 09/04/2020   HCT 40.5 09/04/2020   MCV 88 09/04/2020  PLT 283 09/04/2020   Lab Results  Component Value Date   IRON 37 09/04/2020   TIBC 259 09/04/2020   FERRITIN 287 (H) 09/04/2020   Attestation Statements:   Reviewed by clinician on day of visit: allergies, medications, problem list, medical history, surgical history, family history, social history, and previous encounter notes.  I, Water quality scientist, CMA, am acting as transcriptionist for Briscoe Deutscher, DO  I have reviewed the above documentation for accuracy and completeness, and I agree with the above. Briscoe Deutscher, DO

## 2020-09-11 ENCOUNTER — Encounter (INDEPENDENT_AMBULATORY_CARE_PROVIDER_SITE_OTHER): Payer: Self-pay | Admitting: Family Medicine

## 2020-10-02 ENCOUNTER — Other Ambulatory Visit: Payer: Self-pay

## 2020-10-02 ENCOUNTER — Telehealth (INDEPENDENT_AMBULATORY_CARE_PROVIDER_SITE_OTHER): Payer: Managed Care, Other (non HMO) | Admitting: Family Medicine

## 2020-10-02 ENCOUNTER — Encounter (INDEPENDENT_AMBULATORY_CARE_PROVIDER_SITE_OTHER): Payer: Self-pay | Admitting: Family Medicine

## 2020-10-02 DIAGNOSIS — Z6838 Body mass index (BMI) 38.0-38.9, adult: Secondary | ICD-10-CM

## 2020-10-02 DIAGNOSIS — I152 Hypertension secondary to endocrine disorders: Secondary | ICD-10-CM

## 2020-10-02 DIAGNOSIS — E1169 Type 2 diabetes mellitus with other specified complication: Secondary | ICD-10-CM | POA: Diagnosis not present

## 2020-10-02 DIAGNOSIS — E559 Vitamin D deficiency, unspecified: Secondary | ICD-10-CM | POA: Diagnosis not present

## 2020-10-02 DIAGNOSIS — R110 Nausea: Secondary | ICD-10-CM

## 2020-10-02 DIAGNOSIS — E1159 Type 2 diabetes mellitus with other circulatory complications: Secondary | ICD-10-CM | POA: Diagnosis not present

## 2020-10-02 MED ORDER — ONDANSETRON 4 MG PO TBDP: 4.0000 mg | ORAL_TABLET | Freq: Three times a day (TID) | ORAL | 0 refills | Status: DC | PRN

## 2020-10-02 NOTE — Progress Notes (Signed)
TeleHealth Visit:  Due to the COVID-19 pandemic, this visit was completed with telemedicine (audio/video) technology to reduce patient and provider exposure as well as to preserve personal protective equipment.   Gail Crawford has verbally consented to this TeleHealth visit. The patient is located at home, the provider is located at the [facility] office. The participants in this visit include the listed provider and patient and. The visit was conducted today via [place].  OBESITY Gail Crawford is here to discuss her progress with her obesity treatment plan along with follow-up of her obesity related diagnoses.   Today's visit was #: 80 Starting weight: 266 lbs Starting date: 09/06/2019 Today's weight: 235 lbs Today's date: 10/02/2020 Total lbs lost to date: 31 lbs There is no height or weight on file to calculate BMI.  Total weight loss percentage to date: -11.65%  Interim History: Gail Crawford tested positive for COVID 2 weeks ago.  Then bacterial bilateral otitis media.  Still hurting.  She will see ENT on 2/10.  She says she is tired.  Nutrition Plan: the Category 1 Plan for 25% of the time.  Activity: None at this time.  Assessment/Plan:   1. Type 2 diabetes mellitus with other specified complication, without long-term current use of insulin ([place]) She has been off Trulicity for 1 month due to insurance issues. Issues reviewed: blood sugar goals, complications of diabetes mellitus, hypoglycemia prevention and treatment, exercise, and nutrition.   Plan: The importance of regular follow up with PCP and all other specialists as scheduled was stressed to patient today.  Lab Results  Component Value Date   HGBA1C 5.7 (H) 09/04/2020   HGBA1C 6.3 (H) 10/09/2019   HGBA1C 6.0 (H) 05/25/2019   Lab Results  Component Value Date   LDLCALC 148 (H) 09/04/2020   CREATININE 0.85 09/04/2020   2. Hypertension associated with type 2 diabetes mellitus ([place]) Not at goal. Medications:  chlorthalidone 25 mg daily, losartan 50 mg daily.   Plan: Avoid buying foods that are: processed, frozen, or prepackaged to avoid excess salt. We will continue to monitor symptoms as they relate to her weight loss journey.  BP Readings from Last 3 Encounters:  09/04/20 (!) 143/77  08/05/20 (!) 133/58  06/13/20 (!) 147/74   Lab Results  Component Value Date   CREATININE 0.85 09/04/2020   3. Vitamin D deficiency Improving, but not optimized. Current vitamin D is 37.4, tested on 09/04/2020. Optimal goal > 50 ng/dL.   Plan:  [x]   Continue Vitamin D @50 ,000 IU every week. []   Continue home supplement daily. [x]   Follow-up for routine testing of Vitamin D at least 2-3 times per year to avoid over-replacement.  4. Nausea [name] has been having some intermittent nausea with GLP1RA. Plan:  Start Zofran 4 mg every 8 hours as needed for nausea.  5. Class 2 severe obesity with serious comorbidity and body mass index (BMI) of 38.0 to 38.9 in adult, unspecified obesity type [facility])  Gail Crawford is currently in the action stage of change. As such, her goal is to continue with weight loss efforts. She has agreed to the Category 1 Plan.   Exercise goals: No exercise has been prescribed at this time.  Behavioral modification strategies: increasing lean protein intake, decreasing simple carbohydrates, increasing vegetables and increasing water intake.  Gail Crawford has agreed to follow-up with our clinic in 3 weeks. She was informed of the importance of frequent follow-up visits to maximize her success with intensive lifestyle modifications for her multiple health conditions.  Objective:   VITALS: Per patient if applicable, see vitals. GENERAL: Alert and in no acute distress. CARDIOPULMONARY: No increased WOB. Speaking in clear sentences.  PSYCH: Pleasant and cooperative. Speech normal rate and rhythm. Affect is appropriate. Insight and judgement are appropriate. Attention is focused, linear, and  appropriate.  NEURO: Oriented as arrived to appointment on time with no prompting.   Lab Results  Component Value Date   CREATININE 0.85 09/04/2020   BUN 15 09/04/2020   NA 141 09/04/2020   K 3.8 09/04/2020   CL 99 09/04/2020   CO2 26 09/04/2020   Lab Results  Component Value Date   ALT 9 09/04/2020   AST 15 09/04/2020   ALKPHOS 107 09/04/2020   BILITOT 0.4 09/04/2020   Lab Results  Component Value Date   HGBA1C 5.7 (H) 09/04/2020   HGBA1C 6.3 (H) 10/09/2019   HGBA1C 6.0 (H) 05/25/2019   HGBA1C 5.8 (H) 01/03/2019   HGBA1C 7.1 (H) 09/05/2018   Lab Results  Component Value Date   INSULIN 23.0 09/04/2020   INSULIN 8.6 05/25/2019   INSULIN 13.3 01/03/2019   INSULIN 15.8 09/05/2018   Lab Results  Component Value Date   TSH 2.180 09/04/2020   Lab Results  Component Value Date   CHOL 230 (H) 09/04/2020   HDL 39 (L) 09/04/2020   LDLCALC 148 (H) 09/04/2020   TRIG 237 (H) 09/04/2020   CHOLHDL 5.9 (H) 09/04/2020   Lab Results  Component Value Date   WBC 8.7 09/04/2020   HGB 13.0 09/04/2020   HCT 40.5 09/04/2020   MCV 88 09/04/2020   PLT 283 09/04/2020   Lab Results  Component Value Date   IRON 37 09/04/2020   TIBC 259 09/04/2020   FERRITIN 287 (H) 09/04/2020   Attestation Statements:   Reviewed by clinician on day of visit: allergies, medications, problem list, medical history, surgical history, family history, social history, and previous encounter notes.  I, Water quality scientist, CMA, am acting as transcriptionist for Briscoe Deutscher, DO  I have reviewed the above documentation for accuracy and completeness, and I agree with the above. Briscoe Deutscher, DO

## 2020-10-03 MED ORDER — ONDANSETRON 4 MG PO TBDP
4.0000 mg | ORAL_TABLET | Freq: Three times a day (TID) | ORAL | 0 refills | Status: DC | PRN
Start: 2020-10-03 — End: 2021-05-26

## 2020-10-22 ENCOUNTER — Other Ambulatory Visit (INDEPENDENT_AMBULATORY_CARE_PROVIDER_SITE_OTHER): Payer: Self-pay | Admitting: Family Medicine

## 2020-10-22 DIAGNOSIS — E559 Vitamin D deficiency, unspecified: Secondary | ICD-10-CM

## 2020-10-23 ENCOUNTER — Encounter (INDEPENDENT_AMBULATORY_CARE_PROVIDER_SITE_OTHER): Payer: Self-pay

## 2020-10-23 NOTE — Telephone Encounter (Signed)
Message sent to pt.

## 2020-10-23 NOTE — Telephone Encounter (Signed)
Dr.Wallace °

## 2020-10-28 ENCOUNTER — Other Ambulatory Visit: Payer: Self-pay

## 2020-10-28 ENCOUNTER — Ambulatory Visit (INDEPENDENT_AMBULATORY_CARE_PROVIDER_SITE_OTHER): Payer: Managed Care, Other (non HMO) | Admitting: Family Medicine

## 2020-10-28 ENCOUNTER — Encounter (INDEPENDENT_AMBULATORY_CARE_PROVIDER_SITE_OTHER): Payer: Self-pay | Admitting: Family Medicine

## 2020-10-28 VITALS — BP 117/75 | HR 69 | Temp 98.2°F | Ht 65.0 in | Wt 230.0 lb

## 2020-10-28 DIAGNOSIS — E785 Hyperlipidemia, unspecified: Secondary | ICD-10-CM

## 2020-10-28 DIAGNOSIS — E1169 Type 2 diabetes mellitus with other specified complication: Secondary | ICD-10-CM

## 2020-10-28 DIAGNOSIS — Z9189 Other specified personal risk factors, not elsewhere classified: Secondary | ICD-10-CM

## 2020-10-28 DIAGNOSIS — E559 Vitamin D deficiency, unspecified: Secondary | ICD-10-CM | POA: Diagnosis not present

## 2020-10-28 DIAGNOSIS — Z6838 Body mass index (BMI) 38.0-38.9, adult: Secondary | ICD-10-CM

## 2020-10-29 MED ORDER — VITAMIN D (ERGOCALCIFEROL) 1.25 MG (50000 UNIT) PO CAPS
50000.0000 [IU] | ORAL_CAPSULE | ORAL | 0 refills | Status: DC
Start: 2020-10-29 — End: 2020-12-04

## 2020-10-29 MED ORDER — ROSUVASTATIN CALCIUM 5 MG PO TABS: 5.0000 mg | ORAL_TABLET | Freq: Every day | ORAL | 0 refills | Status: DC

## 2020-10-31 NOTE — Progress Notes (Signed)
Chief Complaint:   OBESITY Gail Crawford is here to discuss her progress with her obesity treatment plan along with follow-up of her obesity related diagnoses.   Today's visit was #: 67 Starting weight: 266 lbs Starting date: 09/06/2019 Today's weight: 230 lbs Today's date: 10/28/2020 Total lbs lost to date: 36 lbs Body mass index is 38.27 kg/m.  Total weight loss percentage to date: -13.53%  Interim History:  Gail Crawford says she does not want to cook.  She has been having a [employer] protein drink for breakfast and lunch. Current Meal Plan: the Category 1 Plan for 20% of the time.  Current Exercise Plan: None at this time. Current Anti-Obesity Medications: Trulicity 3 mg subcutaneously weekly. Side effects: None.  Assessment/Plan:   1. Hyperlipidemia associated with type 2 diabetes mellitus (Gail Crawford) Course: Not at goal. Lipid-lowering medications:  None.   Plan: Dietary changes: Increase soluble fiber, decrease simple carbohydrates, decrease saturated fat. Exercise changes: Moderate to vigorous-intensity aerobic activity 150 minutes per week or as tolerated. We will continue to monitor along with PCP/specialists as it pertains to her weight loss journey.  Will start Crestor 5 mg daily for hyperlipidemia.  Lab Results  Component Value Date   CHOL 230 (H) 09/04/2020   HDL 39 (L) 09/04/2020   LDLCALC 148 (H) 09/04/2020   TRIG 237 (H) 09/04/2020   CHOLHDL 5.9 (H) 09/04/2020   Lab Results  Component Value Date   ALT 9 09/04/2020   AST 15 09/04/2020   ALKPHOS 107 09/04/2020   BILITOT 0.4 09/04/2020   The 10-year ASCVD risk score [name], et al., 2013) is: 15.7%   Values used to calculate the score:     Age: 65 years     Sex: Female     Is Non-Hispanic African American: No     Diabetic: Yes     Tobacco smoker: No     Systolic Blood Pressure: 811 mmHg     Is BP treated: Yes     HDL Cholesterol: 39 mg/dL     Total Cholesterol: 230 mg/dL  - Start rosuvastatin (CRESTOR) 5  MG tablet; Take 1 tablet (5 mg total) by mouth daily.  Dispense: 30 tablet; Refill: 0  2. Vitamin D deficiency Not at goal. Current vitamin D is 37.4, tested on 09/04/2020. Optimal goal > 50 ng/dL.   Plan: Continue to take prescription Vitamin D @50 ,000 IU every week as prescribed.  Follow-up for routine testing of Vitamin D, at least 2-3 times per year to avoid over-replacement.  - Refill Vitamin D, Ergocalciferol, (DRISDOL) 1.25 MG (50000 UNIT) CAPS capsule; Take 1 capsule (50,000 Units total) by mouth every 7 (seven) days.  Dispense: 12 capsule; Refill: 0  3. Type 2 diabetes mellitus with other specified complication, without long-term current use of insulin (Gail Crawford) Diabetes Mellitus: Improving, but not optimized. Medication: Trulicity 3 mg subcutaneously weekly. Issues reviewed: blood sugar goals, complications of diabetes mellitus, hypoglycemia prevention and treatment, exercise, and nutrition.   Plan: The importance of regular follow up with PCP and all other specialists as scheduled was stressed to patient today.  Lab Results  Component Value Date   HGBA1C 5.7 (H) 09/04/2020   HGBA1C 6.3 (H) 10/09/2019   HGBA1C 6.0 (H) 05/25/2019   Lab Results  Component Value Date   LDLCALC 148 (H) 09/04/2020   CREATININE 0.85 09/04/2020   4. At risk for heart disease Due to Gail Crawford's current state of health and medical condition(s), she is at a higher risk for  heart disease.  This puts the patient at much greater risk to subsequently develop cardiopulmonary conditions that can significantly affect patient's quality of life in a negative manner.    At least 8 minutes were spent on counseling Gail Crawford about these concerns today. Counseling:  Intensive lifestyle modifications were discussed with Gail Crawford as the most appropriate first line of treatment.  She will continue to work on diet, exercise, and weight loss efforts.  We will continue to reassess these conditions on a fairly regular basis in an  attempt to decrease the patient's overall morbidity and mortality.  Evidence-based interventions for health behavior change were utilized today including the discussion of self monitoring techniques, problem-solving barriers, and SMART goal setting techniques.  Specifically, regarding patient's less desirable eating habits and patterns, we employed the technique of small changes when Gail Crawford has not been able to fully commit to her prudent nutritional plan.  5. Class 2 severe obesity with serious comorbidity and body mass index (BMI) of 38.0 to 38.9 in adult, unspecified obesity type (Gail Crawford)  Course: Gail Crawford is currently in the action stage of change. As such, her goal is to continue with weight loss efforts.   Nutrition goals: She has agreed to the Category 1 Plan.   Exercise goals: Will join the [facility].  Behavioral modification strategies: increasing lean protein intake, decreasing simple carbohydrates, increasing vegetables, increasing water intake and decreasing liquid calories.  Gail Crawford has agreed to follow-up with our clinic in 3 weeks. She was informed of the importance of frequent follow-up visits to maximize her success with intensive lifestyle modifications for her multiple health conditions.   Objective:   Blood pressure 117/75, pulse 69, temperature 98.2 F (36.8 C), temperature source Oral, height 5\' 5"  (1.651 m), weight 230 lb (104.3 kg), SpO2 97 %. Body mass index is 38.27 kg/m.  General: Cooperative, alert, well developed, in no acute distress. HEENT: Conjunctivae and lids unremarkable. Cardiovascular: Regular rhythm.  Lungs: Normal work of breathing. Neurologic: No focal deficits.   Lab Results  Component Value Date   CREATININE 0.85 09/04/2020   BUN 15 09/04/2020   NA 141 09/04/2020   K 3.8 09/04/2020   CL 99 09/04/2020   CO2 26 09/04/2020   Lab Results  Component Value Date   ALT 9 09/04/2020   AST 15 09/04/2020   ALKPHOS 107 09/04/2020   BILITOT 0.4  09/04/2020   Lab Results  Component Value Date   HGBA1C 5.7 (H) 09/04/2020   HGBA1C 6.3 (H) 10/09/2019   HGBA1C 6.0 (H) 05/25/2019   HGBA1C 5.8 (H) 01/03/2019   HGBA1C 7.1 (H) 09/05/2018   Lab Results  Component Value Date   INSULIN 23.0 09/04/2020   INSULIN 8.6 05/25/2019   INSULIN 13.3 01/03/2019   INSULIN 15.8 09/05/2018   Lab Results  Component Value Date   TSH 2.180 09/04/2020   Lab Results  Component Value Date   CHOL 230 (H) 09/04/2020   HDL 39 (L) 09/04/2020   LDLCALC 148 (H) 09/04/2020   TRIG 237 (H) 09/04/2020   CHOLHDL 5.9 (H) 09/04/2020   Lab Results  Component Value Date   WBC 8.7 09/04/2020   HGB 13.0 09/04/2020   HCT 40.5 09/04/2020   MCV 88 09/04/2020   PLT 283 09/04/2020   Lab Results  Component Value Date   IRON 37 09/04/2020   TIBC 259 09/04/2020   FERRITIN 287 (H) 09/04/2020   Attestation Statements:   Reviewed by clinician on day of visit: allergies, medications, problem list, medical history,  surgical history, family history, social history, and previous encounter notes.  I, Water quality scientist, CMA, am acting as transcriptionist for Briscoe Deutscher, DO  I have reviewed the above documentation for accuracy and completeness, and I agree with the above. Briscoe Deutscher, DO

## 2020-12-04 ENCOUNTER — Ambulatory Visit (INDEPENDENT_AMBULATORY_CARE_PROVIDER_SITE_OTHER): Payer: Managed Care, Other (non HMO) | Admitting: Family Medicine

## 2020-12-04 ENCOUNTER — Encounter (INDEPENDENT_AMBULATORY_CARE_PROVIDER_SITE_OTHER): Payer: Self-pay | Admitting: Family Medicine

## 2020-12-04 ENCOUNTER — Other Ambulatory Visit: Payer: Self-pay

## 2020-12-04 VITALS — BP 121/72 | HR 72 | Temp 98.5°F | Ht 65.0 in | Wt 229.0 lb

## 2020-12-04 DIAGNOSIS — F418 Other specified anxiety disorders: Secondary | ICD-10-CM

## 2020-12-04 DIAGNOSIS — Z6841 Body Mass Index (BMI) 40.0 and over, adult: Secondary | ICD-10-CM

## 2020-12-04 DIAGNOSIS — Z9189 Other specified personal risk factors, not elsewhere classified: Secondary | ICD-10-CM

## 2020-12-04 DIAGNOSIS — E1169 Type 2 diabetes mellitus with other specified complication: Secondary | ICD-10-CM | POA: Diagnosis not present

## 2020-12-04 DIAGNOSIS — E559 Vitamin D deficiency, unspecified: Secondary | ICD-10-CM | POA: Diagnosis not present

## 2020-12-04 DIAGNOSIS — E785 Hyperlipidemia, unspecified: Secondary | ICD-10-CM

## 2020-12-04 MED ORDER — VITAMIN D (ERGOCALCIFEROL) 1.25 MG (50000 UNIT) PO CAPS: 50000.0000 [IU] | ORAL_CAPSULE | ORAL | 0 refills | Status: DC

## 2020-12-04 MED ORDER — FLUOXETINE HCL 20 MG PO TABS: 20.0000 mg | ORAL_TABLET | Freq: Every day | ORAL | 0 refills | Status: DC

## 2020-12-04 MED ORDER — OZEMPIC (1 MG/DOSE) 4 MG/3ML ~~LOC~~ SOPN: 1.0000 mg | PEN_INJECTOR | SUBCUTANEOUS | 0 refills | Status: DC

## 2020-12-04 NOTE — Progress Notes (Signed)
Chief Complaint:   OBESITY Gail Crawford is here to discuss her progress with her obesity treatment plan along with follow-up of her obesity related diagnoses.   Today's visit was #: 18 Starting weight: 266 lbs Starting date: 09/06/2019 Today's weight: 229 lbs Today's date: 12/04/2020 Total lbs lost to date: 37 lbs Body mass index is 38.11 kg/m.  Total weight loss percentage to date: -13.91%  Interim History: Gail Crawford says she has had family anxiety. She also says she has had increased hunger. Current Meal Plan: the Category 1 Plan for 25% of the time.  Current Exercise Plan: Walking/water aerobics for 30 minutes 1-2 times per week. Current Anti-Obesity Medications: Trulicity 3 mg subcutaneously weekly. Side effects: None.  Assessment/Plan:   1. Type 2 diabetes mellitus with other specified complication, without long-term current use of insulin (HCC) Improving, but not optimized. Goal is HgbA1c < 5.7.  Medication: Trulicity 3mg  subcutaneously weekly.   Plan: Stop Trulicity and change to Ozempic 1 mg subcutaneously weekly. She will continue to focus on protein-rich, low simple carbohydrate foods. We reviewed the importance of hydration, regular exercise for stress reduction, and restorative sleep.   Lab Results  Component Value Date   HGBA1C 5.7 (H) 09/04/2020   Lab Results  Component Value Date   INSULIN 23.0 09/04/2020   INSULIN 8.6 05/25/2019   INSULIN 13.3 01/03/2019   INSULIN 15.8 09/05/2018   - Start Semaglutide, 1 MG/DOSE, (OZEMPIC, 1 MG/DOSE,) 4 MG/3ML SOPN; Inject 1 mg into the skin once a week.  Dispense: 3 mL; Refill: 0  2. Hyperlipidemia associated with type 2 diabetes mellitus ([place]) Course: Not at goal. Lipid-lowering medications: Crestor 5 mg daily.   Plan: Dietary changes: Increase soluble fiber, decrease simple carbohydrates, decrease saturated fat. Exercise changes: Moderate to vigorous-intensity aerobic activity 150 minutes per week or as tolerated. We  will continue to monitor along with PCP/specialists as it pertains to her weight loss journey.  Lab Results  Component Value Date   CHOL 230 (H) 09/04/2020   HDL 39 (L) 09/04/2020   LDLCALC 148 (H) 09/04/2020   TRIG 237 (H) 09/04/2020   CHOLHDL 5.9 (H) 09/04/2020   Lab Results  Component Value Date   ALT 9 09/04/2020   AST 15 09/04/2020   ALKPHOS 107 09/04/2020   BILITOT 0.4 09/04/2020   The 10-year ASCVD risk score Gail Crawford DC Jr., et al., 2013) is: 16.7%   Values used to calculate the score:     Age: 65 years     Sex: Female     Is Non-Hispanic African American: No     Diabetic: Yes     Tobacco smoker: No     Systolic Blood Pressure: 128 mmHg     Is BP treated: Yes     HDL Cholesterol: 39 mg/dL     Total Cholesterol: 230 mg/dL  3. Vitamin D deficiency Not at goal. Current vitamin D is 37.4, tested on 09/04/2020. Optimal goal > 50 ng/dL.   Plan: Continue to take prescription Vitamin D @50 ,000 IU every week as prescribed.  Follow-up for routine testing of Vitamin D, at least 2-3 times per year to avoid over-replacement.   - Refill Vitamin D, Ergocalciferol, (DRISDOL) 1.25 MG (50000 UNIT) CAPS capsule; Take 1 capsule (50,000 Units total) by mouth every 7 (seven) days.  Dispense: 12 capsule; Refill: 0  4. Situational anxiety Gail Crawford has had family stress. After discussion, patient would like to start below medication. Expectations, risks, and potential side effects reviewed.   -  Start FLUoxetine (PROZAC) 20 MG tablet; Take 1 tablet (20 mg total) by mouth daily.  Dispense: 30 tablet; Refill: 0  5. Obesity, current BMI 38.2  Course: Gail Crawford is currently in the action stage of change. As such, her goal is to continue with weight loss efforts.   Nutrition goals: She has agreed to the Category 1 Plan.   Exercise goals: For substantial health benefits, adults should do at least 150 minutes (2 hours and 30 minutes) a week of moderate-intensity, or 75 minutes (1 hour and 15  minutes) a week of vigorous-intensity aerobic physical activity, or an equivalent combination of moderate- and vigorous-intensity aerobic activity. Aerobic activity should be performed in episodes of at least 10 minutes, and preferably, it should be spread throughout the week.  Behavioral modification strategies: increasing lean protein intake, decreasing simple carbohydrates, increasing vegetables and increasing water intake.  Gail Crawford has agreed to follow-up with our clinic in 4 weeks. She was informed of the importance of frequent follow-up visits to maximize her success with intensive lifestyle modifications for her multiple health conditions.   Objective:   Blood pressure 121/72, pulse 72, temperature 98.5 F (36.9 C), temperature source Oral, height 5\' 5"  (1.651 m), weight 229 lb (103.9 kg), SpO2 99 %. Body mass index is 38.11 kg/m.  General: Cooperative, alert, well developed, in no acute distress. HEENT: Conjunctivae and lids unremarkable. Cardiovascular: Regular rhythm.  Lungs: Normal work of breathing. Neurologic: No focal deficits.   Lab Results  Component Value Date   CREATININE 0.85 09/04/2020   BUN 15 09/04/2020   NA 141 09/04/2020   K 3.8 09/04/2020   CL 99 09/04/2020   CO2 26 09/04/2020   Lab Results  Component Value Date   ALT 9 09/04/2020   AST 15 09/04/2020   ALKPHOS 107 09/04/2020   BILITOT 0.4 09/04/2020   Lab Results  Component Value Date   HGBA1C 5.7 (H) 09/04/2020   HGBA1C 6.3 (H) 10/09/2019   HGBA1C 6.0 (H) 05/25/2019   HGBA1C 5.8 (H) 01/03/2019   HGBA1C 7.1 (H) 09/05/2018   Lab Results  Component Value Date   INSULIN 23.0 09/04/2020   INSULIN 8.6 05/25/2019   INSULIN 13.3 01/03/2019   INSULIN 15.8 09/05/2018   Lab Results  Component Value Date   TSH 2.180 09/04/2020   Lab Results  Component Value Date   CHOL 230 (H) 09/04/2020   HDL 39 (L) 09/04/2020   LDLCALC 148 (H) 09/04/2020   TRIG 237 (H) 09/04/2020   CHOLHDL 5.9 (H)  09/04/2020   Lab Results  Component Value Date   WBC 8.7 09/04/2020   HGB 13.0 09/04/2020   HCT 40.5 09/04/2020   MCV 88 09/04/2020   PLT 283 09/04/2020   Lab Results  Component Value Date   IRON 37 09/04/2020   TIBC 259 09/04/2020   FERRITIN 287 (H) 09/04/2020    Obesity Behavioral Intervention:   Approximately 15 minutes were spent on the discussion below.  ASK: We discussed the diagnosis of obesity with [name] today and Zoee agreed to give [place] permission to discuss obesity behavioral modification therapy today.  ASSESS: Rendy has the diagnosis of obesity and her BMI today is 38.11. Jung is in the action stage of change.   ADVISE: Hajar was educated on the multiple health risks of obesity as well as the benefit of weight loss to improve her health. She was advised of the need for long term treatment and the importance of lifestyle modifications to improve her current health  and to decrease her risk of future health problems.  AGREE: Multiple dietary modification options and treatment options were discussed and Renelda agreed to follow the recommendations documented in the above note.  ARRANGE: Akiera was educated on the importance of frequent visits to treat obesity as outlined per CMS and USPSTF guidelines and agreed to schedule her next follow up appointment today.  Attestation Statements:   Reviewed by clinician on day of visit: allergies, medications, problem list, medical history, surgical history, family history, social history, and previous encounter notes.  Leodis Binet Friedenbach, CMA, am acting as Location manager for [employer], DO.  I have reviewed the above documentation for accuracy and completeness, and I agree with the above. Briscoe Deutscher, DO

## 2020-12-12 ENCOUNTER — Encounter (INDEPENDENT_AMBULATORY_CARE_PROVIDER_SITE_OTHER): Payer: Self-pay

## 2020-12-12 DIAGNOSIS — E1169 Type 2 diabetes mellitus with other specified complication: Secondary | ICD-10-CM

## 2020-12-16 MED ORDER — OZEMPIC (1 MG/DOSE) 4 MG/3ML ~~LOC~~ SOPN: 1.0000 mg | PEN_INJECTOR | SUBCUTANEOUS | 0 refills | Status: DC

## 2020-12-22 ENCOUNTER — Other Ambulatory Visit (INDEPENDENT_AMBULATORY_CARE_PROVIDER_SITE_OTHER): Payer: Self-pay | Admitting: Family Medicine

## 2020-12-22 DIAGNOSIS — E785 Hyperlipidemia, unspecified: Secondary | ICD-10-CM

## 2020-12-22 DIAGNOSIS — E1169 Type 2 diabetes mellitus with other specified complication: Secondary | ICD-10-CM

## 2020-12-23 MED ORDER — ROSUVASTATIN CALCIUM 5 MG PO TABS: 5.0000 mg | ORAL_TABLET | Freq: Every day | ORAL | 0 refills | Status: DC

## 2020-12-23 NOTE — Telephone Encounter (Signed)
Dr.Wallace °

## 2020-12-31 ENCOUNTER — Ambulatory Visit (INDEPENDENT_AMBULATORY_CARE_PROVIDER_SITE_OTHER): Payer: Managed Care, Other (non HMO) | Admitting: Family Medicine

## 2020-12-31 ENCOUNTER — Encounter (INDEPENDENT_AMBULATORY_CARE_PROVIDER_SITE_OTHER): Payer: Self-pay | Admitting: Family Medicine

## 2020-12-31 ENCOUNTER — Other Ambulatory Visit: Payer: Self-pay

## 2020-12-31 VITALS — BP 116/76 | HR 69 | Temp 98.1°F | Ht 65.0 in | Wt 229.0 lb

## 2020-12-31 DIAGNOSIS — E1169 Type 2 diabetes mellitus with other specified complication: Secondary | ICD-10-CM | POA: Diagnosis not present

## 2020-12-31 DIAGNOSIS — I152 Hypertension secondary to endocrine disorders: Secondary | ICD-10-CM

## 2020-12-31 DIAGNOSIS — Z6841 Body Mass Index (BMI) 40.0 and over, adult: Secondary | ICD-10-CM

## 2020-12-31 DIAGNOSIS — Z9189 Other specified personal risk factors, not elsewhere classified: Secondary | ICD-10-CM | POA: Diagnosis not present

## 2020-12-31 DIAGNOSIS — E1159 Type 2 diabetes mellitus with other circulatory complications: Secondary | ICD-10-CM

## 2020-12-31 DIAGNOSIS — E785 Hyperlipidemia, unspecified: Secondary | ICD-10-CM

## 2020-12-31 MED ORDER — LOSARTAN POTASSIUM 50 MG PO TABS: 50.0000 mg | ORAL_TABLET | Freq: Every day | ORAL | 0 refills | Status: DC

## 2020-12-31 MED ORDER — OZEMPIC (2 MG/DOSE) 8 MG/3ML ~~LOC~~ SOPN: 2.0000 mg | PEN_INJECTOR | SUBCUTANEOUS | 0 refills | Status: DC

## 2020-12-31 MED ORDER — CHLORTHALIDONE 25 MG PO TABS: 25.0000 mg | ORAL_TABLET | Freq: Every day | ORAL | 0 refills | Status: DC

## 2021-01-07 NOTE — Progress Notes (Signed)
Chief Complaint:   OBESITY Gail Crawford is here to discuss her progress with her obesity treatment plan along with follow-up of her obesity related diagnoses.   Today's visit was #: 31 Starting weight: 266 lbs Starting date: 09/06/2019 Today's weight: 229 lbs Today's date: 12/31/2020 Total lbs lost to date: 37 lbs Body mass index is 38.11 kg/m.  Total weight loss percentage to date: -13.91%  Interim History:  Gail Crawford endorses increased polyphagia. Current Meal Plan: the Category 1 Plan for 25% of the time.  Current Exercise Plan: None.  Assessment/Plan:   1. Type 2 diabetes mellitus with other specified complication, without long-term current use of insulin (HCC) Diabetes Mellitus: Not optimized. Medication: Ozempic 1 mg subcutaneously weekly. Issues reviewed: blood sugar goals, complications of diabetes mellitus, hypoglycemia prevention and treatment, exercise, and nutrition.   Plan:  Increase Ozempic to 2 mg subcutaneously weekly. The patient will continue to focus on protein-rich, low simple carbohydrate foods. We reviewed the importance of hydration, regular exercise for stress reduction, and restorative sleep.   Lab Results  Component Value Date   HGBA1C 5.7 (H) 09/04/2020   HGBA1C 6.3 (H) 10/09/2019   HGBA1C 6.0 (H) 05/25/2019   Lab Results  Component Value Date   LDLCALC 148 (H) 09/04/2020   CREATININE 0.85 09/04/2020   - Semaglutide, 2 MG/DOSE, (OZEMPIC, 2 MG/DOSE,) 8 MG/3ML SOPN; Inject 2 mg into the skin once a week.  Dispense: 9 mL; Refill: 0  2. Hypertension associated with diabetes ([place]) At goal. Medications: chlorthalidone 25 mg daily, losartan 50 mg daily.   Plan: Avoid buying foods that are: processed, frozen, or prepackaged to avoid excess salt. We will watch for signs of hypotension as she continues lifestyle modifications. We will continue to monitor closely alongside her PCP and/or Specialist.    BP Readings from Last 3 Encounters:  12/31/20 116/76   12/04/20 121/72  10/28/20 117/75   Lab Results  Component Value Date   CREATININE 0.85 09/04/2020   - Refill chlorthalidone (HYGROTON) 25 MG tablet; Take 1 tablet (25 mg total) by mouth daily.  Dispense: 90 tablet; Refill: 0 - Refill losartan (COZAAR) 50 MG tablet; Take 1 tablet (50 mg total) by mouth daily.  Dispense: 90 tablet; Refill: 0  3. Hyperlipidemia associated with type 2 diabetes mellitus ([place]) Course: Not at goal. Lipid-lowering medications: Crestor 5 mg daily.   Plan: Dietary changes: Increase soluble fiber, decrease simple carbohydrates, decrease saturated fat. Exercise changes: Moderate to vigorous-intensity aerobic activity 150 minutes per week or as tolerated. We will continue to monitor along with PCP/specialists as it pertains to her weight loss journey.  Lab Results  Component Value Date   CHOL 230 (H) 09/04/2020   HDL 39 (L) 09/04/2020   LDLCALC 148 (H) 09/04/2020   TRIG 237 (H) 09/04/2020   CHOLHDL 5.9 (H) 09/04/2020   Lab Results  Component Value Date   ALT 9 09/04/2020   AST 15 09/04/2020   ALKPHOS 107 09/04/2020   BILITOT 0.4 09/04/2020   The 10-year ASCVD risk score [name], et al., 2013) is: 15.5%   Values used to calculate the score:     Age: 65 years     Sex: Female     Is Non-Hispanic African American: No     Diabetic: Yes     Tobacco smoker: No     Systolic Blood Pressure: 401 mmHg     Is BP treated: Yes     HDL Cholesterol: 39 mg/dL  Total Cholesterol: 230 mg/dL  4. At risk for heart disease Due to Gail Crawford's current state of health and medical condition(s), she is at a higher risk for heart disease.  This puts the patient at much greater risk to subsequently develop cardiopulmonary conditions that can significantly affect patient's quality of life in a negative manner.    At least 9 minutes were spent on counseling Gail Crawford about these concerns today. Evidence-based interventions for health behavior change were utilized today  including the discussion of self monitoring techniques, problem-solving barriers, and SMART goal setting techniques.  Specifically, regarding patient's less desirable eating habits and patterns, we employed the technique of small changes when Gail Crawford has not been able to fully commit to her prudent nutritional plan.  5. Obesity, current BMI 38.1  Course: Gail Crawford is currently in the action stage of change. As such, her goal is to continue with weight loss efforts.   Nutrition goals: She has agreed to the Category 1 Plan.   Exercise goals: For substantial health benefits, adults should do at least 150 minutes (2 hours and 30 minutes) a week of moderate-intensity, or 75 minutes (1 hour and 15 minutes) a week of vigorous-intensity aerobic physical activity, or an equivalent combination of moderate- and vigorous-intensity aerobic activity. Aerobic activity should be performed in episodes of at least 10 minutes, and preferably, it should be spread throughout the week.  Behavioral modification strategies: increasing lean protein intake, decreasing simple carbohydrates, increasing vegetables, increasing water intake and decreasing liquid calories.  Gail Crawford has agreed to follow-up with our clinic in 4 weeks. She was informed of the importance of frequent follow-up visits to maximize her success with intensive lifestyle modifications for her multiple health conditions.   Objective:   Blood pressure 116/76, pulse 69, temperature 98.1 F (36.7 C), temperature source Oral, height 5\' 5"  (1.651 m), weight 229 lb (103.9 kg), SpO2 98 %. Body mass index is 38.11 kg/m.  General: Cooperative, alert, well developed, in no acute distress. HEENT: Conjunctivae and lids unremarkable. Cardiovascular: Regular rhythm.  Lungs: Normal work of breathing. Neurologic: No focal deficits.   Lab Results  Component Value Date   CREATININE 0.85 09/04/2020   BUN 15 09/04/2020   NA 141 09/04/2020   K 3.8 09/04/2020    CL 99 09/04/2020   CO2 26 09/04/2020   Lab Results  Component Value Date   ALT 9 09/04/2020   AST 15 09/04/2020   ALKPHOS 107 09/04/2020   BILITOT 0.4 09/04/2020   Lab Results  Component Value Date   HGBA1C 5.7 (H) 09/04/2020   HGBA1C 6.3 (H) 10/09/2019   HGBA1C 6.0 (H) 05/25/2019   HGBA1C 5.8 (H) 01/03/2019   HGBA1C 7.1 (H) 09/05/2018   Lab Results  Component Value Date   INSULIN 23.0 09/04/2020   INSULIN 8.6 05/25/2019   INSULIN 13.3 01/03/2019   INSULIN 15.8 09/05/2018   Lab Results  Component Value Date   TSH 2.180 09/04/2020   Lab Results  Component Value Date   CHOL 230 (H) 09/04/2020   HDL 39 (L) 09/04/2020   LDLCALC 148 (H) 09/04/2020   TRIG 237 (H) 09/04/2020   CHOLHDL 5.9 (H) 09/04/2020   Lab Results  Component Value Date   WBC 8.7 09/04/2020   HGB 13.0 09/04/2020   HCT 40.5 09/04/2020   MCV 88 09/04/2020   PLT 283 09/04/2020   Lab Results  Component Value Date   IRON 37 09/04/2020   TIBC 259 09/04/2020   FERRITIN 287 (H) 09/04/2020  Attestation Statements:   Reviewed by clinician on day of visit: allergies, medications, problem list, medical history, surgical history, family history, social history, and previous encounter notes.  I, Water quality scientist, CMA, am acting as transcriptionist for Briscoe Deutscher, DO  I have reviewed the above documentation for accuracy and completeness, and I agree with the above. Briscoe Deutscher, DO

## 2021-02-03 ENCOUNTER — Encounter (INDEPENDENT_AMBULATORY_CARE_PROVIDER_SITE_OTHER): Payer: Self-pay | Admitting: Family Medicine

## 2021-02-03 ENCOUNTER — Other Ambulatory Visit: Payer: Self-pay

## 2021-02-03 ENCOUNTER — Ambulatory Visit (INDEPENDENT_AMBULATORY_CARE_PROVIDER_SITE_OTHER): Payer: Managed Care, Other (non HMO) | Admitting: Family Medicine

## 2021-02-03 VITALS — BP 134/79 | HR 64 | Temp 98.1°F | Ht 65.0 in | Wt 224.0 lb

## 2021-02-03 DIAGNOSIS — Z9189 Other specified personal risk factors, not elsewhere classified: Secondary | ICD-10-CM

## 2021-02-03 DIAGNOSIS — E8881 Metabolic syndrome: Secondary | ICD-10-CM | POA: Diagnosis not present

## 2021-02-03 DIAGNOSIS — E1159 Type 2 diabetes mellitus with other circulatory complications: Secondary | ICD-10-CM | POA: Diagnosis not present

## 2021-02-03 DIAGNOSIS — E1169 Type 2 diabetes mellitus with other specified complication: Secondary | ICD-10-CM

## 2021-02-03 DIAGNOSIS — E785 Hyperlipidemia, unspecified: Secondary | ICD-10-CM

## 2021-02-03 DIAGNOSIS — F3289 Other specified depressive episodes: Secondary | ICD-10-CM | POA: Diagnosis not present

## 2021-02-03 DIAGNOSIS — Z6841 Body Mass Index (BMI) 40.0 and over, adult: Secondary | ICD-10-CM

## 2021-02-03 DIAGNOSIS — I152 Hypertension secondary to endocrine disorders: Secondary | ICD-10-CM

## 2021-02-03 MED ORDER — PHENTERMINE HCL 8 MG PO TABS: 8.0000 mg | ORAL_TABLET | Freq: Every morning | ORAL | 0 refills | Status: DC

## 2021-02-03 MED ORDER — TOPIRAMATE 25 MG PO TABS: 25.0000 mg | ORAL_TABLET | Freq: Every day | ORAL | 0 refills | Status: DC

## 2021-02-03 MED ORDER — OZEMPIC (2 MG/DOSE) 8 MG/3ML ~~LOC~~ SOPN
2.0000 mg | PEN_INJECTOR | SUBCUTANEOUS | 0 refills | Status: DC
Start: 2021-02-03 — End: 2021-05-26

## 2021-02-04 LAB — CBC WITH DIFFERENTIAL/PLATELET
Basophils Absolute: 0 10*3/uL (ref 0.0–0.2)
Basos: 0 %
EOS (ABSOLUTE): 0.1 10*3/uL (ref 0.0–0.4)
Eos: 1 %
Hematocrit: 40.9 % (ref 34.0–46.6)
Hemoglobin: 13.3 g/dL (ref 11.1–15.9)
Immature Grans (Abs): 0 10*3/uL (ref 0.0–0.1)
Immature Granulocytes: 0 %
Lymphocytes Absolute: 2.6 10*3/uL (ref 0.7–3.1)
Lymphs: 31 %
MCH: 28.2 pg (ref 26.6–33.0)
MCHC: 32.5 g/dL (ref 31.5–35.7)
MCV: 87 fL (ref 79–97)
Monocytes Absolute: 0.6 10*3/uL (ref 0.1–0.9)
Monocytes: 7 %
Neutrophils Absolute: 5.1 10*3/uL (ref 1.4–7.0)
Neutrophils: 61 %
Platelets: 281 10*3/uL (ref 150–450)
RBC: 4.72 x10E6/uL (ref 3.77–5.28)
RDW: 12.4 % (ref 11.7–15.4)
WBC: 8.4 10*3/uL (ref 3.4–10.8)

## 2021-02-04 LAB — COMPREHENSIVE METABOLIC PANEL
ALT: 9 IU/L (ref 0–32)
AST: 13 IU/L (ref 0–40)
Albumin/Globulin Ratio: 1.6 (ref 1.2–2.2)
Albumin: 4.4 g/dL (ref 3.8–4.8)
Alkaline Phosphatase: 109 IU/L (ref 44–121)
BUN/Creatinine Ratio: 19 (ref 12–28)
BUN: 13 mg/dL (ref 8–27)
Bilirubin Total: 0.4 mg/dL (ref 0.0–1.2)
CO2: 26 mmol/L (ref 20–29)
Calcium: 9.9 mg/dL (ref 8.7–10.3)
Chloride: 101 mmol/L (ref 96–106)
Creatinine, Ser: 0.68 mg/dL (ref 0.57–1.00)
Globulin, Total: 2.8 g/dL (ref 1.5–4.5)
Glucose: 78 mg/dL (ref 65–99)
Potassium: 4 mmol/L (ref 3.5–5.2)
Sodium: 140 mmol/L (ref 134–144)
Total Protein: 7.2 g/dL (ref 6.0–8.5)
eGFR: 96 mL/min/{1.73_m2} (ref >59)

## 2021-02-04 LAB — LIPID PANEL
Chol/HDL Ratio: 5.1 ratio — ABNORMAL HIGH (ref 0.0–4.4)
Cholesterol, Total: 221 mg/dL — ABNORMAL HIGH (ref 100–199)
HDL: 43 mg/dL (ref >39)
LDL Chol Calc (NIH): 139 mg/dL — ABNORMAL HIGH (ref 0–99)
Triglycerides: 214 mg/dL — ABNORMAL HIGH (ref 0–149)
VLDL Cholesterol Cal: 39 mg/dL (ref 5–40)

## 2021-02-04 LAB — HEMOGLOBIN A1C
Est. average glucose Bld gHb Est-mCnc: 120 mg/dL
Hgb A1c MFr Bld: 5.8 % — ABNORMAL HIGH (ref 4.8–5.6)

## 2021-02-04 LAB — TSH: TSH: 1.81 u[IU]/mL (ref 0.450–4.500)

## 2021-02-04 LAB — VITAMIN B12: Vitamin B-12: 418 pg/mL (ref 232–1245)

## 2021-02-04 LAB — VITAMIN D 25 HYDROXY (VIT D DEFICIENCY, FRACTURES): Vit D, 25-Hydroxy: 46.1 ng/mL (ref 30.0–100.0)

## 2021-02-04 LAB — T4, FREE: Free T4: 1.24 ng/dL (ref 0.82–1.77)

## 2021-02-05 ENCOUNTER — Encounter (INDEPENDENT_AMBULATORY_CARE_PROVIDER_SITE_OTHER): Payer: Self-pay | Admitting: Family Medicine

## 2021-02-05 DIAGNOSIS — F3289 Other specified depressive episodes: Secondary | ICD-10-CM

## 2021-02-06 MED ORDER — PHENTERMINE HCL 8 MG PO TABS: 8.0000 mg | ORAL_TABLET | Freq: Every morning | ORAL | 0 refills | Status: DC

## 2021-02-06 NOTE — Telephone Encounter (Signed)
Pt last seen by Dr. Wallace.  

## 2021-02-11 NOTE — Progress Notes (Signed)
Chief Complaint:   OBESITY Gail Crawford is here to discuss her progress with her obesity treatment plan along with follow-up of her obesity related diagnoses.   Today's visit was #: 2 Starting weight: 266 lbs Starting date: 09/06/2019 Today's weight: 224 lbs Today's date: 02/03/2021 Weight change since last visit: 5 lbs Total lbs lost to date: 42 lbs Body mass index is 37.28 kg/m.  Total weight loss percentage to date: -15.79%  Interim History: Gail Crawford says she is still very hungry and is wanting sweets.  Current Meal Plan: the Category 1 Plan for 25% of the time.  Current Exercise Plan: None. Current Anti-Obesity Medications: Ozempic 2 mg subcutaneously weekly. Side effects: None.  Assessment/Plan:   Orders Placed This Encounter  Procedures   CBC with Differential/Platelet   Comprehensive metabolic panel   Hemoglobin A1c   Lipid panel   Vitamin B12   VITAMIN D 25 Hydroxy (Vit-D Deficiency, Fractures)   TSH   T4, free   Meds ordered this encounter  Medications   DISCONTD: Phentermine HCl 8 MG TABS    Sig: Take 8 mg by mouth in the morning.    Dispense:  30 tablet    Refill:  0   topiramate (TOPAMAX) 25 MG tablet    Sig: Take 1 tablet (25 mg total) by mouth daily with supper.    Dispense:  30 tablet    Refill:  0   Semaglutide, 2 MG/DOSE, (OZEMPIC, 2 MG/DOSE,) 8 MG/3ML SOPN    Sig: Inject 2 mg into the skin once a week.    Dispense:  9 mL    Refill:  0   1. Type 2 diabetes mellitus with other specified complication, without long-term current use of insulin (HCC) Diabetes Mellitus: Not at goal. Medication: Ozempic 2 mg subcutaneously weekly. Issues reviewed: blood sugar goals, complications of diabetes mellitus, hypoglycemia prevention and treatment, exercise, and nutrition.   Plan:  Continue Ozempic.  Will check labs today. The patient will continue to focus on protein-rich, low simple carbohydrate foods. We reviewed the importance of hydration, regular  exercise for stress reduction, and restorative sleep.   Lab Results  Component Value Date   HGBA1C 5.8 (H) 02/03/2021   HGBA1C 5.7 (H) 09/04/2020   HGBA1C 6.3 (H) 10/09/2019   Lab Results  Component Value Date   LDLCALC 139 (H) 02/03/2021   CREATININE 0.68 02/03/2021   2. Hyperlipidemia associated with type 2 diabetes mellitus ([place]) Course: Not at goal. Lipid-lowering medications: Crestor 5 mg daily.   Plan: Dietary changes: Increase soluble fiber, decrease simple carbohydrates, decrease saturated fat. Exercise changes: Moderate to vigorous-intensity aerobic activity 150 minutes per week or as tolerated. We will continue to monitor along with PCP/specialists as it pertains to her weight loss journey.  Will check labs today.  Lab Results  Component Value Date   CHOL 221 (H) 02/03/2021   HDL 43 02/03/2021   LDLCALC 139 (H) 02/03/2021   TRIG 214 (H) 02/03/2021   CHOLHDL 5.1 (H) 02/03/2021   Lab Results  Component Value Date   ALT 9 02/03/2021   AST 13 02/03/2021   ALKPHOS 109 02/03/2021   BILITOT 0.4 02/03/2021   The 10-year ASCVD risk score [name], et al., 2013) is: 19.1%   Values used to calculate the score:     Age: 65 years     Sex: Female     Is Non-Hispanic African American: No     Diabetic: Yes     Tobacco smoker:  No     Systolic Blood Pressure: 188 mmHg     Is BP treated: Yes     HDL Cholesterol: 43 mg/dL     Total Cholesterol: 221 mg/dL  3. Hypertension associated with diabetes ([place]) Not at goal. Medications: chlorthalidone 25 mg daily, Cozaar 50 mg daily.   Plan: Avoid buying foods that are: processed, frozen, or prepackaged to avoid excess salt. We will watch for signs of hypotension as she continues lifestyle modifications.  BP Readings from Last 3 Encounters:  02/03/21 134/79  12/31/20 116/76  12/04/20 121/72   Lab Results  Component Value Date   CREATININE 0.68 02/03/2021   4. Metabolic syndrome Starting goal: Lose 7-10% of starting weight.  She will continue to focus on protein-rich, low simple carbohydrate foods. We reviewed the importance of hydration, regular exercise for stress reduction, and restorative sleep.  Will check labs today, as  per below.  5. Other depression, with emotional eating Not at goal. Medication: None.  Plan:  Will start Topamax 25 mg daily and phentermine 8 mg daily. Discussed cues and consequences, how thoughts affect eating, model of thoughts, feelings, and behaviors, and strategies for change by focusing on the cue. Discussed cognitive distortions, coping thoughts, and how to change your thoughts.  - Start topiramate (TOPAMAX) 25 MG tablet; Take 1 tablet (25 mg total) by mouth daily with supper.  Dispense: 30 tablet; Refill: 0  Risk versus benefits of medications reviewed. The patient understands monitoring parameters and red flags.   6. At risk for heart disease Due to Gail Crawford's current state of health and medical condition(s), she is at a higher risk for heart disease.  This puts the patient at much greater risk to subsequently develop cardiopulmonary conditions that can significantly affect patient's quality of life in a negative manner.    At least 8 minutes were spent on counseling Gail Crawford about these concerns today. Evidence-based interventions for health behavior change were utilized today including the discussion of self monitoring techniques, problem-solving barriers, and SMART goal setting techniques.  Specifically, regarding patient's less desirable eating habits and patterns, we employed the technique of small changes when Gail Crawford has not been able to fully commit to her prudent nutritional plan.  7. Obesity, current BMI 37.4  Course: Gail Crawford is currently in the action stage of change. As such, her goal is to continue with weight loss efforts.   Nutrition goals: She has agreed to the Category 1 Plan.   Exercise goals: All adults should avoid inactivity. Some physical activity is  better than none, and adults who participate in any amount of physical activity gain some health benefits.  Behavioral modification strategies: increasing lean protein intake, decreasing simple carbohydrates, and increasing vegetables.  Increase water intake.  Gail Crawford has agreed to follow-up with our clinic in 4 weeks. She was informed of the importance of frequent follow-up visits to maximize her success with intensive lifestyle modifications for her multiple health conditions.   Objective:   Blood pressure 134/79, pulse 64, temperature 98.1 F (36.7 C), temperature source Oral, height 5\' 5"  (1.651 m), weight 224 lb (101.6 kg), SpO2 98 %. Body mass index is 37.28 kg/m.  General: Cooperative, alert, well developed, in no acute distress. HEENT: Conjunctivae and lids unremarkable. Cardiovascular: Regular rhythm.  Lungs: Normal work of breathing. Neurologic: No focal deficits.   Lab Results  Component Value Date   CREATININE 0.68 02/03/2021   BUN 13 02/03/2021   NA 140 02/03/2021   K 4.0 02/03/2021   CL  101 02/03/2021   CO2 26 02/03/2021   Lab Results  Component Value Date   ALT 9 02/03/2021   AST 13 02/03/2021   ALKPHOS 109 02/03/2021   BILITOT 0.4 02/03/2021   Lab Results  Component Value Date   HGBA1C 5.8 (H) 02/03/2021   HGBA1C 5.7 (H) 09/04/2020   HGBA1C 6.3 (H) 10/09/2019   HGBA1C 6.0 (H) 05/25/2019   HGBA1C 5.8 (H) 01/03/2019   Lab Results  Component Value Date   INSULIN 23.0 09/04/2020   INSULIN 8.6 05/25/2019   INSULIN 13.3 01/03/2019   INSULIN 15.8 09/05/2018   Lab Results  Component Value Date   TSH 1.810 02/03/2021   Lab Results  Component Value Date   CHOL 221 (H) 02/03/2021   HDL 43 02/03/2021   LDLCALC 139 (H) 02/03/2021   TRIG 214 (H) 02/03/2021   CHOLHDL 5.1 (H) 02/03/2021   Lab Results  Component Value Date   WBC 8.4 02/03/2021   HGB 13.3 02/03/2021   HCT 40.9 02/03/2021   MCV 87 02/03/2021   PLT 281 02/03/2021   Lab Results   Component Value Date   IRON 37 09/04/2020   TIBC 259 09/04/2020   FERRITIN 287 (H) 09/04/2020   Attestation Statements:   Reviewed by clinician on day of visit: allergies, medications, problem list, medical history, surgical history, family history, social history, and previous encounter notes.  I, Water quality scientist, CMA, am acting as transcriptionist for Briscoe Deutscher, DO  I have reviewed the above documentation for accuracy and completeness, and I agree with the above. Briscoe Deutscher, DO

## 2021-03-05 ENCOUNTER — Encounter (INDEPENDENT_AMBULATORY_CARE_PROVIDER_SITE_OTHER): Payer: Self-pay | Admitting: Family Medicine

## 2021-03-05 ENCOUNTER — Ambulatory Visit (INDEPENDENT_AMBULATORY_CARE_PROVIDER_SITE_OTHER): Payer: Managed Care, Other (non HMO) | Admitting: Family Medicine

## 2021-03-05 ENCOUNTER — Other Ambulatory Visit: Payer: Self-pay

## 2021-03-05 VITALS — BP 135/65 | HR 65 | Temp 98.5°F | Ht 65.0 in | Wt 217.0 lb

## 2021-03-05 DIAGNOSIS — Z6841 Body Mass Index (BMI) 40.0 and over, adult: Secondary | ICD-10-CM

## 2021-03-05 DIAGNOSIS — E66813 Obesity, class 3: Secondary | ICD-10-CM

## 2021-03-05 DIAGNOSIS — E785 Hyperlipidemia, unspecified: Secondary | ICD-10-CM

## 2021-03-05 DIAGNOSIS — E1159 Type 2 diabetes mellitus with other circulatory complications: Secondary | ICD-10-CM

## 2021-03-05 DIAGNOSIS — Z9189 Other specified personal risk factors, not elsewhere classified: Secondary | ICD-10-CM

## 2021-03-05 DIAGNOSIS — E1169 Type 2 diabetes mellitus with other specified complication: Secondary | ICD-10-CM | POA: Diagnosis not present

## 2021-03-05 DIAGNOSIS — I152 Hypertension secondary to endocrine disorders: Secondary | ICD-10-CM

## 2021-03-05 DIAGNOSIS — R197 Diarrhea, unspecified: Secondary | ICD-10-CM

## 2021-03-05 NOTE — Progress Notes (Signed)
Chief Complaint:   OBESITY Gail Crawford is here to discuss her progress with her obesity treatment plan along with follow-up of her obesity related diagnoses. See Medical Weight Management Flowsheet for complete bioelectrical impedance results.  Today's visit was #: 92 Starting weight: 266 lbs Starting date: 09/06/2019 Today's weight: 217 lbs Today's date: 03/05/2021 Weight change since last visit: 7 lbs Total lbs lost to date: 49 lbs Body mass index is 36.11 kg/m.  Total weight loss percentage to date: -18.42%  Interim History:  Gail Crawford has had a diarrheal illness for 1-2 weeks.  She is off all medications.  She dropped off stool samples this morning.  Labs are within normal limits.  Nutrition Plan: the Category 1 Plan for 50% of the time. Activity:  None. Anti-obesity medications: phentermine 8 mg daily and Ozempic 2 mg subcutaneously weekly. Reported side effects: None.  Assessment/Plan:   1. Type 2 diabetes mellitus with other specified complication, without long-term current use of insulin (HCC) Diabetes Mellitus: Not at goal. Medication: Ozempic 2 mg subcutaneously weekly. Issues reviewed: blood sugar goals, complications of diabetes mellitus, hypoglycemia prevention and treatment, exercise, and nutrition.   Plan: The patient will continue to focus on protein-rich, low simple carbohydrate foods. We reviewed the importance of hydration, regular exercise for stress reduction, and restorative sleep.   Lab Results  Component Value Date   HGBA1C 5.8 (H) 02/03/2021   HGBA1C 5.7 (H) 09/04/2020   HGBA1C 6.3 (H) 10/09/2019   Lab Results  Component Value Date   LDLCALC 139 (H) 02/03/2021   CREATININE 0.68 02/03/2021   2. Hyperlipidemia associated with type 2 diabetes mellitus ([place]) Course: Not at goal. Lipid-lowering medications: Crestor 5 mg daily.    Plan: Dietary changes: Increase soluble fiber, decrease simple carbohydrates, decrease saturated fat. Exercise changes:  Moderate to vigorous-intensity aerobic activity 150 minutes per week or as tolerated. We will continue to monitor along with PCP/specialists as it pertains to her weight loss journey.  Lab Results  Component Value Date   CHOL 221 (H) 02/03/2021   HDL 43 02/03/2021   LDLCALC 139 (H) 02/03/2021   TRIG 214 (H) 02/03/2021   CHOLHDL 5.1 (H) 02/03/2021   Lab Results  Component Value Date   ALT 9 02/03/2021   AST 13 02/03/2021   ALKPHOS 109 02/03/2021   BILITOT 0.4 02/03/2021   The 10-year ASCVD risk score [name], et al., 2013) is: 29.6%   Values used to calculate the score:     Age: 65 years     Sex: Female     Is Non-Hispanic African American: No     Diabetic: Yes     Tobacco smoker: No     Systolic Blood Pressure: 759 mmHg     Is BP treated: Yes     HDL Cholesterol: 43 mg/dL     Total Cholesterol: 221 mg/dL  3. Hypertension associated with diabetes ([place]) At goal. Medications: chlorthalidone 25 mg daily, losartan 50 mg daily.   Plan: Avoid buying foods that are: processed, frozen, or prepackaged to avoid excess salt. We will watch for signs of hypotension as she continues lifestyle modifications.  BP Readings from Last 3 Encounters:  03/05/21 135/65  02/03/21 134/79  12/31/20 116/76   Lab Results  Component Value Date   CREATININE 0.68 02/03/2021   4. Diarrhea of presumed infectious origin Gail Crawford will follow a bland diet and stay well hydrated until she is feeling better.  5. At risk for malnutrition Gail Crawford was given  extensive malnutrition prevention education and counseling today of more than 8 minutes.  Counseled her that malnutrition refers to inappropriate nutrients or not the right balance of nutrients for optimal health.    6. Obesity, current BMI 36.2  Course: Gail Crawford is currently in the action stage of change. As such, her goal is to continue with weight loss efforts.   Nutrition goals: She has agreed to a bland diet.   Diet reviewed for  dehydration.  Exercise goals: No exercise has been prescribed at this time.  Behavioral modification strategies: increasing water intake.  Gail Crawford has agreed to follow-up with our clinic in 4 weeks. She was informed of the importance of frequent follow-up visits to maximize her success with intensive lifestyle modifications for her multiple health conditions.   Objective:   Blood pressure 135/65, pulse 65, temperature 98.5 F (36.9 C), temperature source Oral, height 5\' 5"  (1.651 m), weight 217 lb (98.4 kg), SpO2 98 %. Body mass index is 36.11 kg/m.  General: Cooperative, alert, well developed, in no acute distress. HEENT: Conjunctivae and lids unremarkable. Cardiovascular: Regular rhythm.  Lungs: Normal work of breathing. Neurologic: No focal deficits.   Lab Results  Component Value Date   CREATININE 0.68 02/03/2021   BUN 13 02/03/2021   NA 140 02/03/2021   K 4.0 02/03/2021   CL 101 02/03/2021   CO2 26 02/03/2021   Lab Results  Component Value Date   ALT 9 02/03/2021   AST 13 02/03/2021   ALKPHOS 109 02/03/2021   BILITOT 0.4 02/03/2021   Lab Results  Component Value Date   HGBA1C 5.8 (H) 02/03/2021   HGBA1C 5.7 (H) 09/04/2020   HGBA1C 6.3 (H) 10/09/2019   HGBA1C 6.0 (H) 05/25/2019   HGBA1C 5.8 (H) 01/03/2019   Lab Results  Component Value Date   INSULIN 23.0 09/04/2020   INSULIN 8.6 05/25/2019   INSULIN 13.3 01/03/2019   INSULIN 15.8 09/05/2018   Lab Results  Component Value Date   TSH 1.810 02/03/2021   Lab Results  Component Value Date   CHOL 221 (H) 02/03/2021   HDL 43 02/03/2021   LDLCALC 139 (H) 02/03/2021   TRIG 214 (H) 02/03/2021   CHOLHDL 5.1 (H) 02/03/2021   Lab Results  Component Value Date   VD25OH 46.1 02/03/2021   VD25OH 37.4 09/04/2020   VD25OH 35.2 10/09/2019   Lab Results  Component Value Date   WBC 8.4 02/03/2021   HGB 13.3 02/03/2021   HCT 40.9 02/03/2021   MCV 87 02/03/2021   PLT 281 02/03/2021   Lab Results   Component Value Date   IRON 37 09/04/2020   TIBC 259 09/04/2020   FERRITIN 287 (H) 09/04/2020   Attestation Statements:   Reviewed by clinician on day of visit: allergies, medications, problem list, medical history, surgical history, family history, social history, and previous encounter notes.  I, Water quality scientist, CMA, am acting as transcriptionist for Briscoe Deutscher, DO  I have reviewed the above documentation for accuracy and completeness, and I agree with the above. Briscoe Deutscher, DO

## 2021-04-16 ENCOUNTER — Encounter (INDEPENDENT_AMBULATORY_CARE_PROVIDER_SITE_OTHER): Payer: Self-pay | Admitting: Family Medicine

## 2021-04-16 ENCOUNTER — Ambulatory Visit (INDEPENDENT_AMBULATORY_CARE_PROVIDER_SITE_OTHER): Payer: Managed Care, Other (non HMO) | Admitting: Family Medicine

## 2021-04-16 ENCOUNTER — Other Ambulatory Visit: Payer: Self-pay

## 2021-04-16 VITALS — BP 126/72 | HR 73 | Temp 98.2°F | Ht 65.0 in | Wt 225.0 lb

## 2021-04-16 DIAGNOSIS — Z9189 Other specified personal risk factors, not elsewhere classified: Secondary | ICD-10-CM | POA: Diagnosis not present

## 2021-04-16 DIAGNOSIS — R140 Abdominal distension (gaseous): Secondary | ICD-10-CM | POA: Diagnosis not present

## 2021-04-16 DIAGNOSIS — E8881 Metabolic syndrome: Secondary | ICD-10-CM

## 2021-04-16 DIAGNOSIS — Z6841 Body Mass Index (BMI) 40.0 and over, adult: Secondary | ICD-10-CM

## 2021-04-16 DIAGNOSIS — E1169 Type 2 diabetes mellitus with other specified complication: Secondary | ICD-10-CM | POA: Diagnosis not present

## 2021-04-21 NOTE — Progress Notes (Signed)
Chief Complaint:   OBESITY Gail Crawford is here to discuss her progress with her obesity treatment plan along with follow-up of her obesity related diagnoses.   Today's visit was #: 62 Starting weight: 266 lbs Starting date: 09/06/2019 Today's weight: 225 lbs Today's date: 04/16/2021 Weight change since last visit: +8 lbs Total lbs lost to date: 41 lbs Body mass index is 37.44 kg/m.  Total weight loss percentage to date: -15.41%  Current Meal Plan: the Category 1 Plan for 0% of the time/bland diet.  Current Exercise Plan: None. Current Anti-Obesity Medications: Ozempic 2 mg subcutaneously weekly and phentermine 8 mg daily. Side effects: None.  Interim History:  [name] took 1 mg of Ozempic on Monday (first dose in 5 weeks).  She ate cabbage/pinto beans and had bloating.  She is up 5 pounds of fluid.  She went on vacation and did not take any medications and ate a low residue diet for the past 6 weeks.  She says she is happy with her weight maintenance.  Assessment/Plan:   1. Postprandial abdominal bloating She is followed by GI.  Plan is for low residue diet with probiotics.  Expectation is weeks to months for resolution.   2. Type 2 diabetes mellitus with other specified complication, without long-term current use of insulin (HCC) Diabetes Mellitus: Improving, but not optimized. Medication: Ozempic 2 mg subcutaneously weekly.   Plan: The patient will continue to focus on protein-rich, low simple carbohydrate foods. We reviewed the importance of hydration, regular exercise for stress reduction, and restorative sleep.   Lab Results  Component Value Date   HGBA1C 5.8 (H) 02/03/2021   HGBA1C 5.7 (H) 09/04/2020   HGBA1C 6.3 (H) 10/09/2019   Lab Results  Component Value Date   LDLCALC 139 (H) 02/03/2021   CREATININE 0.68 02/03/2021   3. Metabolic syndrome Starting goal: Lose 7-10% of starting weight. She will continue to focus on protein-rich, low simple carbohydrate foods.  We reviewed the importance of hydration, regular exercise for stress reduction, and restorative sleep.  We will continue to check lab work every 3 months, with 10% weight loss, or should any other concerns arise.  4. At risk for malnutrition [name] was given extensive malnutrition prevention education and counseling today of more than 8 minutes.  Counseled her that malnutrition refers to inappropriate nutrients or not the right balance of nutrients for optimal health.     5. Obesity, current BMI 37.6  Course: [name] is currently in the action stage of change. As such, her goal is to continue with weight loss efforts.   Nutrition goals: She has agreed to the Category 1 Plan.   Exercise goals: Older adults should follow the adult guidelines. When older adults cannot meet the adult guidelines, they should be as physically active as their abilities and conditions will allow.  Older adults should do exercises that maintain or improve balance if they are at risk of falling.   Behavioral modification strategies: increasing lean protein intake, decreasing simple carbohydrates, increasing water intake, and decreasing sodium intake.  [name] has agreed to follow-up with our clinic in 4 weeks. She was informed of the importance of frequent follow-up visits to maximize her success with intensive lifestyle modifications for her multiple health conditions.   Objective:   Blood pressure 126/72, pulse 73, temperature 98.2 F (36.8 C), temperature source Oral, height '5\' 5"'$  (1.651 m), weight 225 lb (102.1 kg), SpO2 99 %. Body mass index is 37.44 kg/m.  General: Cooperative, alert, well developed,  in no acute distress. HEENT: Conjunctivae and lids unremarkable. Cardiovascular: Regular rhythm.  Lungs: Normal work of breathing. Neurologic: No focal deficits.   Lab Results  Component Value Date   CREATININE 0.68 02/03/2021   BUN 13 02/03/2021   NA 140 02/03/2021   K 4.0 02/03/2021   CL 101  02/03/2021   CO2 26 02/03/2021   Lab Results  Component Value Date   ALT 9 02/03/2021   AST 13 02/03/2021   ALKPHOS 109 02/03/2021   BILITOT 0.4 02/03/2021   Lab Results  Component Value Date   HGBA1C 5.8 (H) 02/03/2021   HGBA1C 5.7 (H) 09/04/2020   HGBA1C 6.3 (H) 10/09/2019   HGBA1C 6.0 (H) 05/25/2019   HGBA1C 5.8 (H) 01/03/2019   Lab Results  Component Value Date   INSULIN 23.0 09/04/2020   INSULIN 8.6 05/25/2019   INSULIN 13.3 01/03/2019   INSULIN 15.8 09/05/2018   Lab Results  Component Value Date   TSH 1.810 02/03/2021   Lab Results  Component Value Date   CHOL 221 (H) 02/03/2021   HDL 43 02/03/2021   LDLCALC 139 (H) 02/03/2021   TRIG 214 (H) 02/03/2021   CHOLHDL 5.1 (H) 02/03/2021   Lab Results  Component Value Date   VD25OH 46.1 02/03/2021   VD25OH 37.4 09/04/2020   VD25OH 35.2 10/09/2019   Lab Results  Component Value Date   WBC 8.4 02/03/2021   HGB 13.3 02/03/2021   HCT 40.9 02/03/2021   MCV 87 02/03/2021   PLT 281 02/03/2021   Lab Results  Component Value Date   IRON 37 09/04/2020   TIBC 259 09/04/2020   FERRITIN 287 (H) 09/04/2020   Attestation Statements:   Reviewed by clinician on day of visit: allergies, medications, problem list, medical history, surgical history, family history, social history, and previous encounter notes.  I, Water quality scientist, CMA, am acting as transcriptionist for Briscoe Deutscher, DO  I have reviewed the above documentation for accuracy and completeness, and I agree with the above. Briscoe Deutscher, DO

## 2021-05-26 ENCOUNTER — Encounter (INDEPENDENT_AMBULATORY_CARE_PROVIDER_SITE_OTHER): Payer: Self-pay | Admitting: Family Medicine

## 2021-05-26 ENCOUNTER — Ambulatory Visit (INDEPENDENT_AMBULATORY_CARE_PROVIDER_SITE_OTHER): Payer: Managed Care, Other (non HMO) | Admitting: Family Medicine

## 2021-05-26 ENCOUNTER — Other Ambulatory Visit: Payer: Self-pay

## 2021-05-26 VITALS — BP 166/84 | HR 70 | Temp 98.3°F | Ht 65.0 in | Wt 227.0 lb

## 2021-05-26 DIAGNOSIS — E1169 Type 2 diabetes mellitus with other specified complication: Secondary | ICD-10-CM | POA: Diagnosis not present

## 2021-05-26 DIAGNOSIS — E1159 Type 2 diabetes mellitus with other circulatory complications: Secondary | ICD-10-CM

## 2021-05-26 DIAGNOSIS — Z9189 Other specified personal risk factors, not elsewhere classified: Secondary | ICD-10-CM | POA: Diagnosis not present

## 2021-05-26 DIAGNOSIS — Z6841 Body Mass Index (BMI) 40.0 and over, adult: Secondary | ICD-10-CM

## 2021-05-26 DIAGNOSIS — R140 Abdominal distension (gaseous): Secondary | ICD-10-CM

## 2021-05-26 DIAGNOSIS — E66813 Obesity, class 3: Secondary | ICD-10-CM

## 2021-05-26 DIAGNOSIS — I152 Hypertension secondary to endocrine disorders: Secondary | ICD-10-CM

## 2021-05-26 MED ORDER — LOSARTAN POTASSIUM 50 MG PO TABS: 50.0000 mg | ORAL_TABLET | Freq: Every day | ORAL | 0 refills | Status: DC

## 2021-05-27 NOTE — Progress Notes (Signed)
Chief Complaint:   OBESITY Gail Crawford is here to discuss her progress with her obesity treatment plan along with follow-up of her obesity related diagnoses.   Today's visit was #: 70 Starting weight: 266 lbs Starting date: 09/06/2019 Today's weight: 227 lbs Today's date: 05/26/2021 Weight change since last visit: +2 lbs Total lbs lost to date: 39 lbs Body mass index is 37.77 kg/m.  Total weight loss percentage to date: -14.66%  Current Meal Plan: the Category 1 Plan for 0% of the time.  Current Exercise Plan: None.  Interim History:  Gail Crawford is still having daily gas and bloating.  She says she is more constipated now.  She is struggling with the low residue plan.  She is followed by GI.  She says she is currently not taking any medications/supplements.  She has not been monitoring her blood pressure or blood sugar.  Assessment/Plan:   1. Hypertension associated with diabetes ([place]) Elevated. Medications: None (she stopped all medications).   Plan:  Restart losartan 50 mg daily.  Avoid buying foods that are: processed, frozen, or prepackaged to avoid excess salt.     BP Readings from Last 3 Encounters:  05/26/21 (!) 166/84  04/16/21 126/72  03/05/21 135/65   Lab Results  Component Value Date   CREATININE 0.68 02/03/2021   - Restart losartan (COZAAR) 50 MG tablet; Take 1 tablet (50 mg total) by mouth daily.  Dispense: 90 tablet; Refill: 0  2. Type 2 diabetes mellitus with other specified complication, without long-term current use of insulin (HCC) Diabetes Mellitus: Not optimized. Medication: None.  We discussed considering Mounjaro when she is ready.    Plan: The patient will continue to focus on protein-rich, low simple carbohydrate foods. We reviewed the importance of hydration, regular exercise for stress reduction, and restorative sleep.   Lab Results  Component Value Date   HGBA1C 5.8 (H) 02/03/2021   HGBA1C 5.7 (H) 09/04/2020   HGBA1C 6.3 (H) 10/09/2019    Lab Results  Component Value Date   LDLCALC 139 (H) 02/03/2021   CREATININE 0.68 02/03/2021   3. Abdominal bloating Followed by GI. We will continue to monitor symptoms as they relate to her weight loss journey.  4. At risk for heart disease Due to Gail Crawford's current state of health and medical condition(s), she is at a higher risk for heart disease.  This puts the patient at much greater risk to subsequently develop cardiopulmonary conditions that can significantly affect patient's quality of life in a negative manner.    At least 8 minutes were spent on counseling Gail Crawford about these concerns today. Evidence-based interventions for health behavior change were utilized today including the discussion of self monitoring techniques, problem-solving barriers, and SMART goal setting techniques.  Specifically, regarding patient's less desirable eating habits and patterns, we employed the technique of small changes when Alzora has not been able to fully commit to her prudent nutritional plan.   5. Obesity, current BMI 37.8  Course: Gail Crawford is currently in the action stage of change. As such, her goal is to continue with weight loss efforts.   Nutrition goals: She has agreed to the Low Residue Plan.   Exercise goals:  Increase NEAT.  Behavioral modification strategies: increasing lean protein intake, increasing water intake, and decreasing sodium intake.  Will has agreed to follow-up with our clinic in 3 months. She was informed of the importance of frequent follow-up visits to maximize her success with intensive lifestyle modifications for her multiple health conditions.  Objective:   Blood pressure (!) 166/84, pulse 70, temperature 98.3 F (36.8 C), temperature source Oral, height 5\' 5"  (1.651 m), weight 227 lb (103 kg), SpO2 97 %. Body mass index is 37.77 kg/m.  General: Cooperative, alert, well developed, in no acute distress. HEENT: Conjunctivae and lids  unremarkable. Cardiovascular: Regular rhythm.  Lungs: Normal work of breathing. Neurologic: No focal deficits.   Lab Results  Component Value Date   CREATININE 0.68 02/03/2021   BUN 13 02/03/2021   NA 140 02/03/2021   K 4.0 02/03/2021   CL 101 02/03/2021   CO2 26 02/03/2021   Lab Results  Component Value Date   ALT 9 02/03/2021   AST 13 02/03/2021   ALKPHOS 109 02/03/2021   BILITOT 0.4 02/03/2021   Lab Results  Component Value Date   HGBA1C 5.8 (H) 02/03/2021   HGBA1C 5.7 (H) 09/04/2020   HGBA1C 6.3 (H) 10/09/2019   HGBA1C 6.0 (H) 05/25/2019   HGBA1C 5.8 (H) 01/03/2019   Lab Results  Component Value Date   INSULIN 23.0 09/04/2020   INSULIN 8.6 05/25/2019   INSULIN 13.3 01/03/2019   INSULIN 15.8 09/05/2018   Lab Results  Component Value Date   TSH 1.810 02/03/2021   Lab Results  Component Value Date   CHOL 221 (H) 02/03/2021   HDL 43 02/03/2021   LDLCALC 139 (H) 02/03/2021   TRIG 214 (H) 02/03/2021   CHOLHDL 5.1 (H) 02/03/2021   Lab Results  Component Value Date   VD25OH 46.1 02/03/2021   VD25OH 37.4 09/04/2020   VD25OH 35.2 10/09/2019   Lab Results  Component Value Date   WBC 8.4 02/03/2021   HGB 13.3 02/03/2021   HCT 40.9 02/03/2021   MCV 87 02/03/2021   PLT 281 02/03/2021   Lab Results  Component Value Date   IRON 37 09/04/2020   TIBC 259 09/04/2020   FERRITIN 287 (H) 09/04/2020   Attestation Statements:   Reviewed by clinician on day of visit: allergies, medications, problem list, medical history, surgical history, family history, social history, and previous encounter notes.  I, Water quality scientist, CMA, am acting as transcriptionist for Briscoe Deutscher, DO  I have reviewed the above documentation for accuracy and completeness, and I agree with the above. Briscoe Deutscher, DO

## 2021-07-31 ENCOUNTER — Encounter (INDEPENDENT_AMBULATORY_CARE_PROVIDER_SITE_OTHER): Payer: Self-pay | Admitting: Family Medicine

## 2021-07-31 DIAGNOSIS — I152 Hypertension secondary to endocrine disorders: Secondary | ICD-10-CM

## 2021-08-04 MED ORDER — LOSARTAN POTASSIUM 50 MG PO TABS: 50.0000 mg | ORAL_TABLET | Freq: Every day | ORAL | 0 refills | Status: DC

## 2021-08-21 ENCOUNTER — Encounter (INDEPENDENT_AMBULATORY_CARE_PROVIDER_SITE_OTHER): Payer: Self-pay | Admitting: Family Medicine

## 2021-08-21 ENCOUNTER — Ambulatory Visit (INDEPENDENT_AMBULATORY_CARE_PROVIDER_SITE_OTHER): Payer: Managed Care, Other (non HMO) | Admitting: Family Medicine

## 2021-08-21 ENCOUNTER — Telehealth (INDEPENDENT_AMBULATORY_CARE_PROVIDER_SITE_OTHER): Payer: Self-pay

## 2021-08-21 ENCOUNTER — Other Ambulatory Visit: Payer: Self-pay

## 2021-08-21 ENCOUNTER — Encounter (INDEPENDENT_AMBULATORY_CARE_PROVIDER_SITE_OTHER): Payer: Self-pay

## 2021-08-21 VITALS — BP 122/90 | HR 67 | Temp 98.0°F | Ht 65.0 in | Wt 238.0 lb

## 2021-08-21 DIAGNOSIS — E559 Vitamin D deficiency, unspecified: Secondary | ICD-10-CM | POA: Diagnosis not present

## 2021-08-21 DIAGNOSIS — E1169 Type 2 diabetes mellitus with other specified complication: Secondary | ICD-10-CM

## 2021-08-21 DIAGNOSIS — E1159 Type 2 diabetes mellitus with other circulatory complications: Secondary | ICD-10-CM | POA: Diagnosis not present

## 2021-08-21 DIAGNOSIS — E785 Hyperlipidemia, unspecified: Secondary | ICD-10-CM

## 2021-08-21 DIAGNOSIS — Z6841 Body Mass Index (BMI) 40.0 and over, adult: Secondary | ICD-10-CM

## 2021-08-21 DIAGNOSIS — R140 Abdominal distension (gaseous): Secondary | ICD-10-CM

## 2021-08-21 DIAGNOSIS — I152 Hypertension secondary to endocrine disorders: Secondary | ICD-10-CM

## 2021-08-21 DIAGNOSIS — E538 Deficiency of other specified B group vitamins: Secondary | ICD-10-CM

## 2021-08-21 MED ORDER — TIRZEPATIDE 2.5 MG/0.5ML ~~LOC~~ SOAJ: 2.5000 mg | SUBCUTANEOUS | 0 refills | Status: DC

## 2021-08-21 NOTE — Progress Notes (Signed)
Chief Complaint:   OBESITY Gail Crawford is here to discuss her progress with her obesity treatment plan along with follow-up of her obesity related diagnoses. See Medical Weight Management Flowsheet for complete bioelectrical impedance results.  Today's visit was #: 51 Starting weight: 266 lbs Starting date: 09/06/2019 Weight change since last visit: +11 lbs Total lbs lost to date: 28 lbs Total weight loss percentage to date: -10.53%  Nutrition Plan: Low Residue plan for 0% of the time. Activity: None.  Interim History: Gail Crawford had a KUB that showed stool in her colon.  GI advised MiraLAX, simethicone, and probiotic.  Her goal is to start the program again on January 2.  She needs vitamin D sent to [facility] (90 days) before the end of the year.  She says she is ready for a restart.  Assessment/Plan:   1. Type 2 diabetes mellitus with other specified complication, without long-term current use of insulin (HCC) Diabetes Mellitus: Improving, but not optimized. Medication: None. Issues reviewed: blood sugar goals, complications of diabetes mellitus, hypoglycemia prevention and treatment, exercise, and nutrition.  Plan:  Start Mounjaro 2.5 mg subcutaneously weekly. The patient will continue to focus on protein-rich, low simple carbohydrate foods. We reviewed the importance of hydration, regular exercise for stress reduction, and restorative sleep. Check labs today.  Lab Results  Component Value Date   HGBA1C 5.8 (H) 02/03/2021   HGBA1C 5.7 (H) 09/04/2020   HGBA1C 6.3 (H) 10/09/2019   Lab Results  Component Value Date   LDLCALC 139 (H) 02/03/2021   CREATININE 0.68 02/03/2021   - CBC with Differential/Platelet - Comprehensive metabolic panel - Hemoglobin A1c - Start tirzepatide [facility]) 2.5 MG/0.5ML Pen; Inject 2.5 mg into the skin once a week.  Dispense: 6 mL; Refill: 0  2. Hypertension associated with diabetes ([place]) At goal. Medications: Cozaar 50 mg daily.   Plan:  Avoid buying foods that are: processed, frozen, or prepackaged to avoid excess salt. We will watch for signs of hypotension as she continues lifestyle modifications.  BP Readings from Last 3 Encounters:  08/21/21 122/90  05/26/21 (!) 166/84  04/16/21 126/72   Lab Results  Component Value Date   CREATININE 0.68 02/03/2021   - TSH - T4, free  3. Hyperlipidemia associated with type 2 diabetes mellitus ([place]) Course: Not at goal. Lipid-lowering medications: None.   Plan: Dietary changes: Increase soluble fiber, decrease simple carbohydrates, decrease saturated fat. Exercise changes: Moderate to vigorous-intensity aerobic activity 150 minutes per week or as tolerated. We will continue to monitor along with PCP/specialists as it pertains to her weight loss journey.  Lab Results  Component Value Date   CHOL 221 (H) 02/03/2021   HDL 43 02/03/2021   LDLCALC 139 (H) 02/03/2021   TRIG 214 (H) 02/03/2021   CHOLHDL 5.1 (H) 02/03/2021   Lab Results  Component Value Date   ALT 9 02/03/2021   AST 13 02/03/2021   ALKPHOS 109 02/03/2021   BILITOT 0.4 02/03/2021   The 10-year ASCVD risk score (Arnett DK, et al., 2019) is: 29%   Values used to calculate the score:     Age: 65 years     Sex: Female     Is Non-Hispanic African American: No     Diabetic: Yes     Tobacco smoker: Yes     Systolic Blood Pressure: 277 mmHg     Is BP treated: Yes     HDL Cholesterol: 43 mg/dL     Total Cholesterol: 221 mg/dL  -  Lipid panel  4. Abdominal bloating Gail Crawford is followed by GI.  She has recently had a KUB and has been advised to take MiraLAX, simethicone, and a probiotic.  5. Vitamin D deficiency Not optimized.   Plan: Will check vitamin D level today.  Lab Results  Component Value Date   VD25OH 46.1 02/03/2021   VD25OH 37.4 09/04/2020   VD25OH 35.2 10/09/2019   - VITAMIN D 25 Hydroxy (Vit-D Deficiency, Fractures)  6. B12 deficiency Lab Results  Component Value Date   VITAMINB12 418  02/03/2021   Supplementation: None.   Plan:  Will check vitamin B12 level today.  - Vitamin B12  7. Obesity, current BMI 39.7  Course: Gail Crawford is currently in the action stage of change. As such, her goal is to continue with weight loss efforts.   Nutrition goals: She has agreed to the Category 2 Plan.   Exercise goals:  Increase NEAT.  Behavioral modification strategies: increasing lean protein intake, decreasing simple carbohydrates, and increasing water intake.  Gail Crawford has agreed to follow-up with our clinic in 4 weeks. She was informed of the importance of frequent follow-up visits to maximize her success with intensive lifestyle modifications for her multiple health conditions.   Gail Crawford was informed we would discuss her lab results at her next visit unless there is a critical issue that needs to be addressed sooner. Gail Crawford agreed to keep her next visit at the agreed upon time to discuss these results.  Objective:   Blood pressure 122/90, pulse 67, temperature 98 F (36.7 C), temperature source Oral, height 5\' 5"  (1.651 m), weight 238 lb (108 kg), SpO2 96 %. Body mass index is 39.61 kg/m.  General: Cooperative, alert, well developed, in no acute distress. HEENT: Conjunctivae and lids unremarkable. Cardiovascular: Regular rhythm.  Lungs: Normal work of breathing. Neurologic: No focal deficits.   Lab Results  Component Value Date   CREATININE 0.68 02/03/2021   BUN 13 02/03/2021   NA 140 02/03/2021   K 4.0 02/03/2021   CL 101 02/03/2021   CO2 26 02/03/2021   Lab Results  Component Value Date   ALT 9 02/03/2021   AST 13 02/03/2021   ALKPHOS 109 02/03/2021   BILITOT 0.4 02/03/2021   Lab Results  Component Value Date   HGBA1C 5.8 (H) 02/03/2021   HGBA1C 5.7 (H) 09/04/2020   HGBA1C 6.3 (H) 10/09/2019   HGBA1C 6.0 (H) 05/25/2019   HGBA1C 5.8 (H) 01/03/2019   Lab Results  Component Value Date   INSULIN 23.0 09/04/2020   INSULIN 8.6 05/25/2019    INSULIN 13.3 01/03/2019   INSULIN 15.8 09/05/2018   Lab Results  Component Value Date   TSH 1.810 02/03/2021   Lab Results  Component Value Date   CHOL 221 (H) 02/03/2021   HDL 43 02/03/2021   LDLCALC 139 (H) 02/03/2021   TRIG 214 (H) 02/03/2021   CHOLHDL 5.1 (H) 02/03/2021   Lab Results  Component Value Date   VD25OH 46.1 02/03/2021   VD25OH 37.4 09/04/2020   VD25OH 35.2 10/09/2019   Lab Results  Component Value Date   WBC 8.4 02/03/2021   HGB 13.3 02/03/2021   HCT 40.9 02/03/2021   MCV 87 02/03/2021   PLT 281 02/03/2021   Lab Results  Component Value Date   IRON 37 09/04/2020   TIBC 259 09/04/2020   FERRITIN 287 (H) 09/04/2020   Attestation Statements:   Reviewed by clinician on day of visit: allergies, medications, problem list, medical history, surgical history, family  history, social history, and previous encounter notes.  Time spent on visit including pre-visit chart review and post-visit care and documentation was 43 minutes. Time was spent on: Food choices and timing of food intake reviewed today. I discussed a personalized meal plan with the patient that will help her to lose weight and will improve her obesity-related conditions going forward. I performed a medically necessary appropriate examination and/or evaluation. I discussed the assessment and treatment plan with the patient. Motivational interviewing as well as evidence-based interventions for health behavior change were utilized today including the discussion of self monitoring techniques, problem-solving barriers and SMART goal setting techniques.  An exercise prescription was reviewed.  The patient was provided an opportunity to ask questions and all were answered. The patient agreed with the plan and demonstrated an understanding of the instructions. Labs were ordered at this visit and will be reviewed at the next visit unless more critical results need to be addressed immediately. Clinical information was  updated and documented in the EMR.  I, Water quality scientist, CMA, am acting as transcriptionist for Briscoe Deutscher, DO  I have reviewed the above documentation for accuracy and completeness, and I agree with the above. -  Briscoe Deutscher, DO, MS, FAAFP, DABOM - Family and Bariatric Medicine.

## 2021-08-21 NOTE — Telephone Encounter (Signed)
Prior authorization started for Mounjaro. Will notify patient and provider once response is received.  ?

## 2021-08-21 NOTE — Telephone Encounter (Signed)
Please start PA for Mounjaro 2.5

## 2021-08-22 LAB — COMPREHENSIVE METABOLIC PANEL
ALT: 8 IU/L (ref 0–32)
AST: 16 IU/L (ref 0–40)
Albumin/Globulin Ratio: 1.8 (ref 1.2–2.2)
Albumin: 4.6 g/dL (ref 3.8–4.8)
Alkaline Phosphatase: 143 IU/L — ABNORMAL HIGH (ref 44–121)
BUN/Creatinine Ratio: 17 (ref 12–28)
BUN: 12 mg/dL (ref 8–27)
Bilirubin Total: 0.3 mg/dL (ref 0.0–1.2)
CO2: 24 mmol/L (ref 20–29)
Calcium: 9.6 mg/dL (ref 8.7–10.3)
Chloride: 101 mmol/L (ref 96–106)
Creatinine, Ser: 0.71 mg/dL (ref 0.57–1.00)
Globulin, Total: 2.5 g/dL (ref 1.5–4.5)
Glucose: 101 mg/dL — ABNORMAL HIGH (ref 70–99)
Potassium: 4.4 mmol/L (ref 3.5–5.2)
Sodium: 141 mmol/L (ref 134–144)
Total Protein: 7.1 g/dL (ref 6.0–8.5)
eGFR: 93 mL/min/{1.73_m2} (ref >59)

## 2021-08-22 LAB — HEMOGLOBIN A1C
Est. average glucose Bld gHb Est-mCnc: 134 mg/dL
Hgb A1c MFr Bld: 6.3 % — ABNORMAL HIGH (ref 4.8–5.6)

## 2021-08-22 LAB — LIPID PANEL
Chol/HDL Ratio: 4.2 ratio (ref 0.0–4.4)
Cholesterol, Total: 213 mg/dL — ABNORMAL HIGH (ref 100–199)
HDL: 51 mg/dL (ref >39)
LDL Chol Calc (NIH): 122 mg/dL — ABNORMAL HIGH (ref 0–99)
Triglycerides: 229 mg/dL — ABNORMAL HIGH (ref 0–149)
VLDL Cholesterol Cal: 40 mg/dL (ref 5–40)

## 2021-08-22 LAB — CBC WITH DIFFERENTIAL/PLATELET
Basophils Absolute: 0 10*3/uL (ref 0.0–0.2)
Basos: 1 %
EOS (ABSOLUTE): 0.1 10*3/uL (ref 0.0–0.4)
Eos: 1 %
Hematocrit: 40.2 % (ref 34.0–46.6)
Hemoglobin: 12.9 g/dL (ref 11.1–15.9)
Immature Grans (Abs): 0 10*3/uL (ref 0.0–0.1)
Immature Granulocytes: 0 %
Lymphocytes Absolute: 2.2 10*3/uL (ref 0.7–3.1)
Lymphs: 35 %
MCH: 27.7 pg (ref 26.6–33.0)
MCHC: 32.1 g/dL (ref 31.5–35.7)
MCV: 86 fL (ref 79–97)
Monocytes Absolute: 0.5 10*3/uL (ref 0.1–0.9)
Monocytes: 7 %
Neutrophils Absolute: 3.6 10*3/uL (ref 1.4–7.0)
Neutrophils: 56 %
Platelets: 246 10*3/uL (ref 150–450)
RBC: 4.66 x10E6/uL (ref 3.77–5.28)
RDW: 12.9 % (ref 11.7–15.4)
WBC: 6.5 10*3/uL (ref 3.4–10.8)

## 2021-08-22 LAB — VITAMIN B12: Vitamin B-12: 539 pg/mL (ref 232–1245)

## 2021-08-22 LAB — VITAMIN D 25 HYDROXY (VIT D DEFICIENCY, FRACTURES): Vit D, 25-Hydroxy: 24 ng/mL — ABNORMAL LOW (ref 30.0–100.0)

## 2021-08-22 LAB — TSH: TSH: 2.38 u[IU]/mL (ref 0.450–4.500)

## 2021-08-22 LAB — T4, FREE: Free T4: 1.13 ng/dL (ref 0.82–1.77)

## 2021-08-28 ENCOUNTER — Encounter (INDEPENDENT_AMBULATORY_CARE_PROVIDER_SITE_OTHER): Payer: Self-pay | Admitting: Family Medicine

## 2021-08-28 DIAGNOSIS — E1169 Type 2 diabetes mellitus with other specified complication: Secondary | ICD-10-CM

## 2021-08-28 MED ORDER — TIRZEPATIDE 2.5 MG/0.5ML ~~LOC~~ SOAJ: 2.5000 mg | SUBCUTANEOUS | 0 refills | Status: DC

## 2021-10-07 ENCOUNTER — Encounter (INDEPENDENT_AMBULATORY_CARE_PROVIDER_SITE_OTHER): Payer: Self-pay

## 2021-10-08 ENCOUNTER — Encounter (INDEPENDENT_AMBULATORY_CARE_PROVIDER_SITE_OTHER): Payer: Self-pay | Admitting: Family Medicine

## 2021-10-08 ENCOUNTER — Ambulatory Visit (INDEPENDENT_AMBULATORY_CARE_PROVIDER_SITE_OTHER): Payer: 59 | Admitting: Family Medicine

## 2021-10-08 ENCOUNTER — Other Ambulatory Visit: Payer: Self-pay

## 2021-10-08 VITALS — BP 140/82 | HR 68 | Temp 98.1°F | Ht 65.0 in | Wt 234.0 lb

## 2021-10-08 DIAGNOSIS — E1169 Type 2 diabetes mellitus with other specified complication: Secondary | ICD-10-CM | POA: Diagnosis not present

## 2021-10-08 DIAGNOSIS — E785 Hyperlipidemia, unspecified: Secondary | ICD-10-CM | POA: Diagnosis not present

## 2021-10-08 DIAGNOSIS — R748 Abnormal levels of other serum enzymes: Secondary | ICD-10-CM | POA: Diagnosis not present

## 2021-10-08 DIAGNOSIS — E669 Obesity, unspecified: Secondary | ICD-10-CM

## 2021-10-08 DIAGNOSIS — E559 Vitamin D deficiency, unspecified: Secondary | ICD-10-CM | POA: Diagnosis not present

## 2021-10-08 DIAGNOSIS — Z6839 Body mass index (BMI) 39.0-39.9, adult: Secondary | ICD-10-CM

## 2021-10-09 MED ORDER — TIRZEPATIDE 5 MG/0.5ML ~~LOC~~ SOAJ: 5.0000 mg | SUBCUTANEOUS | 0 refills | Status: DC

## 2021-10-09 MED ORDER — VITAMIN D (ERGOCALCIFEROL) 1.25 MG (50000 UNIT) PO CAPS: 50000.0000 [IU] | ORAL_CAPSULE | ORAL | 0 refills | Status: DC

## 2021-10-09 NOTE — Progress Notes (Signed)
Chief Complaint:   OBESITY Gail Crawford is here to discuss her progress with her obesity treatment plan along with follow-up of her obesity related diagnoses. See Medical Weight Management Flowsheet for complete bioelectrical impedance results.  Today's visit was #: 30 Starting weight: 266 lbs Starting date: 09/06/2019 Weight change since last visit: 4 lbs Total lbs lost to date: 32 lbs Total weight loss percentage to date: -12.03%  Nutrition Plan: Category 2 Plan for 25% of the time.  Activity: None. Anti-obesity medications: Mounjaro 2.5 mg subcutaneously weekly. Reported side effects: None.  Interim History: Gail Crawford's alk phos is 143.  She says it took several weeks to get [facility].  She has been on 2.5 mg for 3 doses now.  Denies side effects.  Labs from 07/2021 were reviewed with her today.  Assessment/Plan:   1. Type 2 diabetes mellitus with other specified complication, without long-term current use of insulin (HCC) Diabetes Mellitus: Not at goal. Medication: Mounjaro 2.5 mg subcutaneously weekly. Issues reviewed: blood sugar goals, complications of diabetes mellitus, hypoglycemia prevention and treatment, exercise, and nutrition.  A1c 6.3 (prior to starting [facility]).  Plan: Increase Mounjaro to 5 mg subcutaneously weekly.  The patient will continue to focus on protein-rich, low simple carbohydrate foods. We reviewed the importance of hydration, regular exercise for stress reduction, and restorative sleep.   Lab Results  Component Value Date   HGBA1C 6.3 (H) 08/21/2021   HGBA1C 5.8 (H) 02/03/2021   HGBA1C 5.7 (H) 09/04/2020   Lab Results  Component Value Date   LDLCALC 122 (H) 08/21/2021   CREATININE 0.71 08/21/2021   - Increase tirzepatide (MOUNJARO) 5 MG/0.5ML Pen; Inject 5 mg into the skin once a week.  Dispense: 2 mL; Refill: 0  2. Elevated alkaline phosphatase level New.  Mild.  Denies RUQ complaints.  She has had abdominal bloating and diarrhea for several  months.  Followed by GI.  3. Hyperlipidemia associated with type 2 diabetes mellitus ([place]) Course: Not at goal. Lipid-lowering medications: None. Previously prescribed Crestor 5 mg.  Stopped when GI issues began.  Plan: Dietary changes: Increase soluble fiber, decrease simple carbohydrates, decrease saturated fat. Exercise changes: Moderate to vigorous-intensity aerobic activity 150 minutes per week or as tolerated. We will continue to monitor along with PCP/specialists as it pertains to her weight loss journey.  Lab Results  Component Value Date   CHOL 213 (H) 08/21/2021   HDL 51 08/21/2021   LDLCALC 122 (H) 08/21/2021   TRIG 229 (H) 08/21/2021   CHOLHDL 4.2 08/21/2021   Lab Results  Component Value Date   ALT 8 08/21/2021   AST 16 08/21/2021   ALKPHOS 143 (H) 08/21/2021   BILITOT 0.3 08/21/2021   The 10-year ASCVD risk score (Arnett DK, et al., 2019) is: 34.2%   Values used to calculate the score:     Age: 66 years     Sex: Female     Is Non-Hispanic African American: No     Diabetic: Yes     Tobacco smoker: Yes     Systolic Blood Pressure: 664 mmHg     Is BP treated: Yes     HDL Cholesterol: 51 mg/dL     Total Cholesterol: 213 mg/dL  4. Vitamin D deficiency Not at goal. She is taking vitamin D 50,000 IU weekly.  Plan: Continue to take prescription Vitamin D _0 ,000 IU every week as prescribed.  Follow-up for routine testing of Vitamin D, at least 2-3 times per year to avoid over-replacement.  Lab Results  Component Value Date   VD25OH 24.0 (L) 08/21/2021   VD25OH 46.1 02/03/2021   VD25OH 37.4 09/04/2020   - Refill Vitamin D, Ergocalciferol, (DRISDOL) 1.25 MG (50000 UNIT) CAPS capsule; Take 1 capsule (50,000 Units total) by mouth every 7 (seven) days.  Dispense: 4 capsule; Refill: 0  5. Obesity, current BMI 39  Course: Gail Crawford is currently in the action stage of change. As such, her goal is to continue with weight loss efforts.   Nutrition goals: She has agreed  to the Category 2 Plan.   Exercise goals:  Increase NEAT.  Behavioral modification strategies: increasing lean protein intake, decreasing simple carbohydrates, increasing vegetables, increasing water intake, and decreasing liquid calories.  Dane has agreed to follow-up with our clinic in 4 weeks. She was informed of the importance of frequent follow-up visits to maximize her success with intensive lifestyle modifications for her multiple health conditions.   Objective:   Blood pressure 140/82, pulse 68, temperature 98.1 F (36.7 C), temperature source Oral, height _0  (1.651 m), weight 234 lb (106.1 kg), SpO2 99 %. Body mass index is 38.94 kg/m.  General: Cooperative, alert, well developed, in no acute distress. HEENT: Conjunctivae and lids unremarkable. Cardiovascular: Regular rhythm.  Lungs: Normal work of breathing. Neurologic: No focal deficits.   Lab Results  Component Value Date   CREATININE 0.71 08/21/2021   BUN 12 08/21/2021   NA 141 08/21/2021   K 4.4 08/21/2021   CL 101 08/21/2021   CO2 24 08/21/2021   Lab Results  Component Value Date   ALT 8 08/21/2021   AST 16 08/21/2021   ALKPHOS 143 (H) 08/21/2021   BILITOT 0.3 08/21/2021   Lab Results  Component Value Date   HGBA1C 6.3 (H) 08/21/2021   HGBA1C 5.8 (H) 02/03/2021   HGBA1C 5.7 (H) 09/04/2020   HGBA1C 6.3 (H) 10/09/2019   HGBA1C 6.0 (H) 05/25/2019   Lab Results  Component Value Date   INSULIN 23.0 09/04/2020   INSULIN 8.6 05/25/2019   INSULIN 13.3 01/03/2019   INSULIN 15.8 09/05/2018   Lab Results  Component Value Date   TSH 2.380 08/21/2021   Lab Results  Component Value Date   CHOL 213 (H) 08/21/2021   HDL 51 08/21/2021   LDLCALC 122 (H) 08/21/2021   TRIG 229 (H) 08/21/2021   CHOLHDL 4.2 08/21/2021   Lab Results  Component Value Date   VD25OH 24.0 (L) 08/21/2021   VD25OH 46.1 02/03/2021   VD25OH 37.4 09/04/2020   Lab Results  Component Value Date   WBC 6.5 08/21/2021   HGB  12.9 08/21/2021   HCT 40.2 08/21/2021   MCV 86 08/21/2021   PLT 246 08/21/2021   Lab Results  Component Value Date   IRON 37 09/04/2020   TIBC 259 09/04/2020   FERRITIN 287 (H) 09/04/2020   Attestation Statements:   Reviewed by clinician on day of visit: allergies, medications, problem list, medical history, surgical history, family history, social history, and previous encounter notes.  I, Water quality scientist, CMA, am acting as transcriptionist for Briscoe Deutscher, DO  I have reviewed the above documentation for accuracy and completeness, and I agree with the above. -  Briscoe Deutscher, DO, MS, FAAFP, DABOM - Family and Bariatric Medicine.

## 2021-10-22 ENCOUNTER — Encounter (INDEPENDENT_AMBULATORY_CARE_PROVIDER_SITE_OTHER): Payer: Self-pay | Admitting: Family Medicine

## 2021-10-22 DIAGNOSIS — E1169 Type 2 diabetes mellitus with other specified complication: Secondary | ICD-10-CM

## 2021-10-22 MED ORDER — TIRZEPATIDE 5 MG/0.5ML ~~LOC~~ SOAJ: 5.0000 mg | SUBCUTANEOUS | 0 refills | Status: DC

## 2021-10-22 NOTE — Telephone Encounter (Signed)
Dr.Wallace °

## 2021-10-22 NOTE — Telephone Encounter (Signed)
I just tried to process prior authorization for [facility]. I received a message: An active PA is already on file with expiration date of 08/21/2022.

## 2021-10-23 MED ORDER — TIRZEPATIDE 2.5 MG/0.5ML ~~LOC~~ SOAJ: 2.5000 mg | SUBCUTANEOUS | 0 refills | Status: DC

## 2021-10-23 NOTE — Telephone Encounter (Signed)
Spoke with pt and [facility] has the 5, 7.5, and 10 mg are on backorder and they informed pt to have [place] send in the 2.5 mg twice weekly

## 2021-10-23 NOTE — Telephone Encounter (Signed)
Dr.Wallace °

## 2021-11-13 ENCOUNTER — Encounter (INDEPENDENT_AMBULATORY_CARE_PROVIDER_SITE_OTHER): Payer: Self-pay | Admitting: Family Medicine

## 2021-11-13 ENCOUNTER — Other Ambulatory Visit: Payer: Self-pay

## 2021-11-13 ENCOUNTER — Ambulatory Visit (INDEPENDENT_AMBULATORY_CARE_PROVIDER_SITE_OTHER): Payer: 59 | Admitting: Family Medicine

## 2021-11-13 VITALS — BP 144/80 | HR 62 | Temp 97.8°F | Ht 65.0 in | Wt 228.0 lb

## 2021-11-13 DIAGNOSIS — E559 Vitamin D deficiency, unspecified: Secondary | ICD-10-CM

## 2021-11-13 DIAGNOSIS — E1169 Type 2 diabetes mellitus with other specified complication: Secondary | ICD-10-CM

## 2021-11-13 DIAGNOSIS — I152 Hypertension secondary to endocrine disorders: Secondary | ICD-10-CM

## 2021-11-13 DIAGNOSIS — R159 Full incontinence of feces: Secondary | ICD-10-CM

## 2021-11-13 DIAGNOSIS — E1159 Type 2 diabetes mellitus with other circulatory complications: Secondary | ICD-10-CM

## 2021-11-13 DIAGNOSIS — Z7985 Long-term (current) use of injectable non-insulin antidiabetic drugs: Secondary | ICD-10-CM

## 2021-11-13 DIAGNOSIS — Z6838 Body mass index (BMI) 38.0-38.9, adult: Secondary | ICD-10-CM

## 2021-11-13 DIAGNOSIS — K8020 Calculus of gallbladder without cholecystitis without obstruction: Secondary | ICD-10-CM

## 2021-11-13 DIAGNOSIS — E669 Obesity, unspecified: Secondary | ICD-10-CM

## 2021-11-13 MED ORDER — VITAMIN D (ERGOCALCIFEROL) 1.25 MG (50000 UNIT) PO CAPS: 50000.0000 [IU] | ORAL_CAPSULE | ORAL | 0 refills | Status: AC

## 2021-11-13 MED ORDER — LOSARTAN POTASSIUM 50 MG PO TABS: 50.0000 mg | ORAL_TABLET | Freq: Every day | ORAL | 0 refills | Status: AC

## 2021-11-13 MED ORDER — TIRZEPATIDE 7.5 MG/0.5ML ~~LOC~~ SOAJ: 7.5000 mg | SUBCUTANEOUS | 0 refills | Status: AC

## 2021-11-25 NOTE — Progress Notes (Signed)
Chief Complaint:   OBESITY Gail Crawford is here to discuss her progress with her obesity treatment plan along with follow-up of her obesity related diagnoses. See Medical Weight Management Flowsheet for complete bioelectrical impedance results.  Today's visit was #: 56 Starting weight: 266 lbs Starting date: 09/05/2021 Weight change since last visit: 6 lbs Total lbs lost to date: 38 lbs Total weight loss percentage to date: -14.29%  Nutrition Plan: Category 2 Plan for 10% of the time.  Activity: None. Anti-obesity medications: Mounjaro 5 mg subcutaneously weekly. Reported side effects: None.  Interim History: RUQ US showed gallstones and elevated alk phos.  Assessment/Plan:   1. Type 2 diabetes mellitus with other specified complication, without long-term current use of insulin (HCC) Diabetes Mellitus: Not at goal. Medication: Mounjaro 5 mg subcutaneously weekly. Issues reviewed: blood sugar goals, complications of diabetes mellitus, hypoglycemia prevention and treatment, exercise, and nutrition.  Plan:  Increase Mounjaro to 7.5 mg subcutaneously weekly.  The patient will continue to focus on protein-rich, low simple carbohydrate foods. We reviewed the importance of hydration, regular exercise for stress reduction, and restorative sleep.   Lab Results  Component Value Date   HGBA1C 6.3 (H) 08/21/2021   HGBA1C 5.8 (H) 02/03/2021   HGBA1C 5.7 (H) 09/04/2020   Lab Results  Component Value Date   LDLCALC 122 (H) 08/21/2021   CREATININE 0.71 08/21/2021   - Increase tirzepatide (MOUNJARO) 7.5 MG/0.5ML Pen; Inject 7.5 mg into the skin once a week.  Dispense: 6 mL; Refill: 0  2. Hypertension associated with diabetes ([place]) Not at goal. Medications: Cozaar 50 mg daily.   Plan: Continue Cozaar 50 mg daily.  Will refill today, as per below.  Avoid buying foods that are: processed, frozen, or prepackaged to avoid excess salt. We will watch for signs of hypotension as she continues  lifestyle modifications.  BP Readings from Last 3 Encounters:  11/13/21 (!) 144/80  10/08/21 140/82  08/21/21 122/90   Lab Results  Component Value Date   CREATININE 0.71 08/21/2021   - Refill losartan (COZAAR) 50 MG tablet; Take 1 tablet (50 mg total) by mouth daily.  Dispense: 90 tablet; Refill: 0  3. Vitamin D deficiency Not at goal. She is taking vitamin D 50,000 IU weekly.  Plan: Continue to take prescription Vitamin D @50 ,000 IU every week as prescribed.  Follow-up for routine testing of Vitamin D, at least 2-3 times per year to avoid over-replacement.  Lab Results  Component Value Date   VD25OH 24.0 (L) 08/21/2021   VD25OH 46.1 02/03/2021   VD25OH 37.4 09/04/2020   - Refill Vitamin D, Ergocalciferol, (DRISDOL) 1.25 MG (50000 UNIT) CAPS capsule; Take 1 capsule (50,000 Units total) by mouth every 7 (seven) days.  Dispense: 12 capsule; Refill: 0  4. Incontinence of feces, unspecified fecal incontinence type Gail Crawford is meeting with GI on May 11th.  GI thinks this is related to constipation - taking MiraLAX.  5. Gallstones The patient understands monitoring parameters and red flags.   6. At risk for malnutrition Gail Crawford was given extensive malnutrition prevention education and counseling today of more than 9 minutes.  Counseled her that malnutrition refers to inappropriate nutrients or not the right balance of nutrients for optimal health.    7. Obesity with current BMI of 38.1  Course: Gail Crawford is currently in the action stage of change. As such, her goal is to continue with weight loss efforts.   Nutrition goals: She has agreed to a Low Residue Diet.  Exercise goals:  Increase NEAT.  Behavioral modification strategies: increasing lean protein intake, decreasing simple carbohydrates, and increasing water intake.  Gail Crawford has agreed to follow-up with our clinic in 6 weeks. She was informed of the importance of frequent follow-up visits to maximize her success  with intensive lifestyle modifications for her multiple health conditions.   Objective:   Blood pressure (!) 144/80, pulse 62, temperature 97.8 F (36.6 C), temperature source Oral, height 5' 5"  (1.651 m), weight 228 lb (103.4 kg), SpO2 98 %. Body mass index is 37.94 kg/m.  General: Cooperative, alert, well developed, in no acute distress. HEENT: Conjunctivae and lids unremarkable. Cardiovascular: Regular rhythm.  Lungs: Normal work of breathing. Neurologic: No focal deficits.   Lab Results  Component Value Date   CREATININE 0.71 08/21/2021   BUN 12 08/21/2021   NA 141 08/21/2021   K 4.4 08/21/2021   CL 101 08/21/2021   CO2 24 08/21/2021   Lab Results  Component Value Date   ALT 8 08/21/2021   AST 16 08/21/2021   ALKPHOS 143 (H) 08/21/2021   BILITOT 0.3 08/21/2021   Lab Results  Component Value Date   HGBA1C 6.3 (H) 08/21/2021   HGBA1C 5.8 (H) 02/03/2021   HGBA1C 5.7 (H) 09/04/2020   HGBA1C 6.3 (H) 10/09/2019   HGBA1C 6.0 (H) 05/25/2019   Lab Results  Component Value Date   INSULIN 23.0 09/04/2020   INSULIN 8.6 05/25/2019   INSULIN 13.3 01/03/2019   INSULIN 15.8 09/05/2018   Lab Results  Component Value Date   TSH 2.380 08/21/2021   Lab Results  Component Value Date   CHOL 213 (H) 08/21/2021   HDL 51 08/21/2021   LDLCALC 122 (H) 08/21/2021   TRIG 229 (H) 08/21/2021   CHOLHDL 4.2 08/21/2021   Lab Results  Component Value Date   VD25OH 24.0 (L) 08/21/2021   VD25OH 46.1 02/03/2021   VD25OH 37.4 09/04/2020   Lab Results  Component Value Date   WBC 6.5 08/21/2021   HGB 12.9 08/21/2021   HCT 40.2 08/21/2021   MCV 86 08/21/2021   PLT 246 08/21/2021   Lab Results  Component Value Date   IRON 37 09/04/2020   TIBC 259 09/04/2020   FERRITIN 287 (H) 09/04/2020   Attestation Statements:   Reviewed by clinician on day of visit: allergies, medications, problem list, medical history, surgical history, family history, social history, and previous  encounter notes.  I, Water quality scientist, CMA, am acting as transcriptionist for Briscoe Deutscher, DO  I have reviewed the above documentation for accuracy and completeness, and I agree with the above. -  Briscoe Deutscher, DO, MS, FAAFP, DABOM - Family and Bariatric Medicine.

## 2022-04-08 ENCOUNTER — Encounter (INDEPENDENT_AMBULATORY_CARE_PROVIDER_SITE_OTHER): Payer: Self-pay

## 2022-06-09 NOTE — Therapy (Unsigned)
OUTPATIENT PHYSICAL THERAPY FEMALE PELVIC EVALUATION   Patient Name: Gail Crawford MRN: [id number] DOB:09/13/1955, 66 y.o., female Today's Date: 06/10/2022   PT End of Session - 06/10/22 1527     Visit Number 1    Date for PT Re-Evaluation 09/02/22    Authorization Type UHC    PT Start Time 4967    PT Stop Time 5916    PT Time Calculation (min) 40 min    Activity Tolerance Patient tolerated treatment well    Behavior During Therapy [facility] for tasks assessed/performed             Past Medical History:  Diagnosis Date   Asthma 01-2004   Back pain    Carcinoma of fallopian tube, 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   Fallopian tube cancer, 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    HTN (hypertension)    Joint pain    Leg edema    Peripheral neuropathy 01/15/2012   Past Surgical History:  Procedure Laterality Date   ABDOMINAL HYSTERECTOMY  07-15-04   TYPE II RADICAL HYSTERECTOMY WITH BSO   CESAREAN SECTION  1978   MASS EXCISION Left 06/11/2015   Procedure: EXCISION LEFT ABDOMINAL WALL MASS 6X3CM;  Surgeon: Stark Klein, MD;  Location: [place];  Service: General;  Laterality: Left;   ORTHOPEDIC SURGERY     RIGHT SHOULDER AND ELBOW AND RIGHT FOOT   Patient Active Problem List   Diagnosis Date Noted   Metabolic syndrome 38/46/6599   Primary osteoarthritis of right knee 01/30/2020   Myalgia due to statin 12/13/2019   Gastroesophageal reflux disease without esophagitis 08/30/2019   Hyperlipidemia associated with type 2 diabetes mellitus ([place]), goal < 70, with statin-induced myalgias, Lipid Clinic has been offered  06/14/2019   Vitamin D deficiency 10/24/2018   Type 2 diabetes mellitus ([place]), Rx Trulicity 35/70/1779   Hypertension associated with diabetes ([place]), Rx Losartan and Chlorthalidone 10/06/2018   Hypertensive cardiomegaly 09/12/2018    PCP: Chesley Noon, MD  REFERRING PROVIDER: Briscoe Deutscher, DO  REFERRING DIAG: R15.9 (ICD-10-CM) - Fecal  incontinence  THERAPY DIAG:  Muscle weakness (generalized)  Other lack of coordination  Rationale for Evaluation and Treatment Rehabilitation  ONSET DATE: 2021  SUBJECTIVE:                                                                                                                                                                                           SUBJECTIVE STATEMENT: 2 years ago started with uncontrolled bowels. The colonoscopy I still had stool coming out. Last April had her gall bladder removed. She is not able to hold the  stool back. She will leak stool on the way to the bathroom.  Fluid intake: Yes: 2 bottles of water per day and decaf tea     PAIN:  Are you having pain? Yes NPRS scale: 7/10 Pain location:  back to the lower abdomen  Pain type: pressure Pain description: intermittent   Aggravating factors: sitting Relieving factors: getting up to walk  PRECAUTIONS: Other: cancer  WEIGHT BEARING RESTRICTIONS No  FALLS:  Has patient fallen in last 6 months? No  LIVING ENVIRONMENT: Lives with: lives with their family  OCCUPATION: work in [employer]  PLOF: [place] reduce leakage and pain, hold her urine better.   PERTINENT HISTORY:  Abdominal hysterectomy 07/15/04; C-section 1978; Carcinoma fallopian tube 07/15/04; peripheral neuropathy  BOWEL MOVEMENT Pain with bowel movement: Yes, after bowel movement there is burning Type of bowel movement:Type (Bristol Stool Scale) Type 4 and type 6 at the end, Frequency 1-3 days, and Strain Yes sometimes Fully empty rectum: No Leakage: Yes: with gas, if she does not get to the bathroom quickly Pads: No, keeps one in pocketbook, in locker at work and in car Fiber supplement: No  URINATION Pain with urination: No Fully empty bladder: No Stream: Strong and strong at night and during the day is average  Urgency: Yes:   Frequency: night time void every 2 hours; daytime every 3  hours Leakage: Walking to the bathroom, Coughing, Sneezing, Laughing, and Lifting  INTERCOURSE Pain with intercourse:  gets urge to go to the bathroom, pain with deeper penetration Ability to have vaginal penetration:  Yes:   Climax: sometimes Marinoff Scale: 1/3  PREGNANCY Vaginal deliveries 1 C-section deliveries 1 Currently pregnant No      OBJECTIVE:   DIAGNOSTIC FINDINGS:  none  COGNITION:  Overall cognitive status: Within functional limits for tasks assessed     SENSATION:  Light touch: Appears intact  Proprioception: Appears intact   LUMBAR SPECIAL TESTS:  none  FUNCTIONAL TESTS:  none                 POSTURE: No Significant postural limitations   PELVIC ALIGNMENT:  LUMBARAROM/PROM  A/PROM A/PROM  eval  Flexion full  Extension Decreased by 25%  Right lateral flexion Decreased by 25%  Left lateral flexion Decreased by 25%  Right rotation Decreased by 25%  Left rotation Decreased by 25%   (Blank rows = not tested)  LOWER EXTREMITY ROM:  Passive ROM Right eval Left eval  Hip internal rotation 5 5  Hip external rotation  40   (Blank rows = not tested)  LOWER EXTREMITY MMT:  MMT Right eval Left eval  Hip extension 4/5   Hip abduction 4/5     PALPATION:   General  abdominal scar has decreased movement; tenderness located in the left lower quadrant. Rib angle greater than 90, left gluteus medius, decreased movement of L1-L5, left gluteus medius, left quadratus                External Perineal Exam intact                             Internal Pelvic Floor tenderness along the left obturator internist, right iliococcygeus; decreased mobility of the posterior vaginal canal and puborectalis  Patient confirms identification and approves PT to assess internal pelvic floor and treatment Yes for the vaginal, no for the rectal due to irritation.   PELVIC MMT:   MMT eval  Vaginal 3/5  Internal Anal Sphincter   External Anal Sphincter    Puborectalis   (Blank rows = not tested)        TONE: increased  PROLAPSE: none  TODAY'S TREATMENT  EVAL Date: 06/10/2022 HEP established-see below     PATIENT EDUCATION:  06/10/2022 Education details: Access Code: [place]; educated patient on vaginal moisturizers Person educated: Patient Education method: Consulting civil engineer, Demonstration, Tactile cues, Verbal cues, and Handouts Education comprehension: verbalized understanding, returned demonstration, verbal cues required, tactile cues required, and needs further education   HOME EXERCISE PROGRAM: 06/10/2022 Access Code: [email or web address] URL: [email or web address] Date: 06/10/2022 Prepared by: Earlie Counts  Exercises - Seated Pelvic Floor Contraction  - 3 x daily - 7 x weekly - 1 sets - 10 reps - 5 sec hold  ASSESSMENT:  CLINICAL IMPRESSION: Patient is a 66 y.o. female who was seen today for physical therapy evaluation and treatment for fecal incontinence. Patient reports she started to have issues with fecal incontinence 2 years ago. She will leak stool when she is walking to the bathroom if she waits and when she passes gas. Patient reports she will strain at times with bowel movements and will have to have one every 1-3 days. Her stool type is initially 4 then ends with type 6. Patient reports she will leak urine with  walking to the bathroom, coughing, sneezing, laughing, and lifting. Pelvic floor strength vaginally is 3/5 and not able to assess rectally due to patient being sore. She has tenderness located in the right iliococcygeus, and left obturator internist. She has tightness in the posterior vaginal canal. Patient has decreased mobility of the scars on the lower abdomen and tenderness in the left lower quadrant. Her rib angle is greater than 90 degrees. She has reduction of lumbar mobility. She reports her pain from the lumbar to the lower abdomen is 7/10 with sitting. Patient would benefit from skilled therapy to  improve pelvic floor coordination and strength to reduce urinary and fecal leakage while reducing pain.    OBJECTIVE IMPAIRMENTS decreased activity tolerance, decreased coordination, decreased endurance, decreased mobility, decreased strength, increased fascial restrictions, and pain.   ACTIVITY LIMITATIONS lifting, sitting, continence, and toileting  PARTICIPATION LIMITATIONS: interpersonal relationship, community activity, and occupation  PERSONAL FACTORS Age, Fitness, and 1-2 comorbidities: Abdominal hysterectomy 07/15/04; C-section 1978; Carcinoma fallopian tube 07/15/04; peripheral neuropathy  are also affecting patient's functional outcome.   REHAB POTENTIAL: Excellent  CLINICAL DECISION MAKING: Evolving/moderate complexity  EVALUATION COMPLEXITY: Moderate   GOALS: Goals reviewed with patient? Yes  SHORT TERM GOALS: Target date: 07/08/2022  Patient independent with scar massage.  Baseline: Goal status: INITIAL  2.  Patient is able to contract her abdominals with reduction of her rib cage angle.  Baseline:  Goal status: INITIAL  3.  Patient educated on vaginal moisturizers and lubricants to improve vaginal health.  Baseline:  Goal status: INITIAL  4.  Patient educated on urge to void  Baseline:  Goal status: INITIAL   LONG TERM GOALS: Target date: 09/02/2022   Patient independent with her HEP for pelvic floor and core strength to reduce leakage and pain.  Baseline:  Goal status: INITIAL  2.  Patient reports she is able to walk to the bathroom and not leak stool due to the ability to contract the anus and increased endurance.  Baseline:  Goal status: INITIAL  3.  Patient able to walk to the bathroom without leaking urine due to improved pelvic floor coordination and holding a contraction >/= 10 sec.  Baseline:  Goal status: INITIAL  4.  Patient reports she is able to pass gas without leaking stool due to improved pelvic floor strength.  Baseline:  Goal status:  INITIAL  5.  Patient reports her pain from the back to the abdomen decreased </= 2/10 due to improved lumbar mobility and reduction of restrictions of the lower abdomen.  Baseline:  Goal status: INITIAL  PLAN: PT FREQUENCY: 1x/week  PT DURATION: 12 weeks  PLANNED INTERVENTIONS: Therapeutic exercises, Therapeutic activity, Neuromuscular re-education, Patient/Family education, Self Care, Joint mobilization, Dry Needling, Electrical stimulation, Spinal mobilization, Cryotherapy, Moist heat, scar mobilization, Taping, Biofeedback, and Manual therapy  PLAN FOR NEXT SESSION: manual work to the abdomen; scar massage education, manual work to the vaginal canal, lubricant education   Earlie Counts, PT 06/10/22 4:55 PM

## 2022-06-10 ENCOUNTER — Other Ambulatory Visit: Payer: Self-pay

## 2022-06-10 ENCOUNTER — Ambulatory Visit: Payer: 59 | Attending: Family Medicine | Admitting: Physical Therapy

## 2022-06-10 ENCOUNTER — Encounter: Payer: Self-pay | Admitting: Physical Therapy

## 2022-06-10 DIAGNOSIS — R278 Other lack of coordination: Secondary | ICD-10-CM | POA: Diagnosis not present

## 2022-06-10 DIAGNOSIS — M6281 Muscle weakness (generalized): Secondary | ICD-10-CM

## 2022-06-10 DIAGNOSIS — R159 Full incontinence of feces: Secondary | ICD-10-CM | POA: Insufficient documentation

## 2022-06-10 NOTE — Patient Instructions (Signed)
Moisturizers They are used in the vagina to hydrate the mucous membrane that make up the vaginal canal. Designed to keep a more normal acid balance (ph) Once placed in the vagina, it will last between two to three days.  Use 2-3 times per week at bedtime  Ingredients to avoid is glycerin and fragrance, can increase chance of infection Should not be used just before sex due to causing irritation Most are gels administered either in a tampon-shaped applicator or as a vaginal suppository. They are non-hormonal.   Types of Moisturizers(internal use)  Vitamin E vaginal suppositories- Whole foods, Amazon Moist Again Coconut oil- can break down condoms Julva- (Do no use if on Tamoxifen) amazon Yes moisturizer- amazon NeuEve Silk , NeuEve Silver for menopausal or over 65 (if have severe vaginal atrophy or cancer treatments use NeuEve Silk for  1 month than move to NeuEve Silver)- Amazon, Neuve.com Olive and Bee intimate cream- www.oliveandbee.com.au Mae vaginal moisturizer- Amazon Aloe Good Clean Love Hyaluronic acid Hyalofemme replens   Creams to use externally on the Vulva area Desert Harvest Releveum (good for for cancer patients that had radiation to the area)- amazon or www.desertharvest.com V-magic cream - amazon Julva-amazon Vital "V Wild Yam salve ( help moisturize and help with thinning vulvar area, does have Beeswax MoodMaid Botanical Pro-Meno Wild Yam Cream- Amazon Desert Harvest Gele [name] by Damiva labial moisturizer (Amazon,  Coconut or olive oil aloe Good Clean Love Enchanted Rose by intimate rose  Things to avoid in the vaginal area Do not use things to irritate the vulvar area No lotions just specialized creams for the vulva area- Neogyn, V-magic, No soaps; can use Aveeno or Calendula cleanser if needed. Must be gentle No deodorants No douches Good to sleep without underwear to let the vaginal area to air out No scrubbing: spread the lips to let warm water rinse  over labias and pat dry  [facility] [address], [address] [place], [place] [address] Phone # [phone] Fax [phone]  

## 2022-07-20 ENCOUNTER — Encounter: Payer: Self-pay | Admitting: Physical Therapy

## 2022-07-20 ENCOUNTER — Ambulatory Visit: Payer: 59 | Attending: Family Medicine | Admitting: Physical Therapy

## 2022-07-20 DIAGNOSIS — R278 Other lack of coordination: Secondary | ICD-10-CM | POA: Diagnosis present

## 2022-07-20 DIAGNOSIS — M6281 Muscle weakness (generalized): Secondary | ICD-10-CM | POA: Insufficient documentation

## 2022-07-20 NOTE — Therapy (Signed)
OUTPATIENT PHYSICAL THERAPY TREATMENT NOTE   Patient Name: Gail Crawford MRN: [id number] DOB:10-22-1955, 66 y.o., female Today's Date: 07/20/2022  PCP: Chesley Noon, MD REFER RING PROVIDER: Briscoe Deutscher, DO   END OF SESSION:   PT End of Session - 07/20/22 0757     Visit Number 2    Date for PT Re-Evaluation 09/02/22    Authorization Type UHC    PT Start Time 0800    PT Stop Time 0840    PT Time Calculation (min) 40 min    Activity Tolerance Patient tolerated treatment well    Behavior During Therapy [facility] for tasks assessed/performed             Past Medical History:  Diagnosis Date   Asthma 01-2004   Back pain    Carcinoma of fallopian tube, 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   Fallopian tube cancer, 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    HTN (hypertension)    Joint pain    Leg edema    Peripheral neuropathy 01/15/2012   Past Surgical History:  Procedure Laterality Date   ABDOMINAL HYSTERECTOMY  07-15-04   TYPE II RADICAL HYSTERECTOMY WITH BSO   CESAREAN SECTION  1978   MASS EXCISION Left 06/11/2015   Procedure: EXCISION LEFT ABDOMINAL WALL MASS 6X3CM;  Surgeon: Stark Klein, MD;  Location: [place];  Service: General;  Laterality: Left;   ORTHOPEDIC SURGERY     RIGHT SHOULDER AND ELBOW AND RIGHT FOOT   Patient Active Problem List   Diagnosis Date Noted   Metabolic syndrome 94/76/5465   Primary osteoarthritis of right knee 01/30/2020   Myalgia due to statin 12/13/2019   Gastroesophageal reflux disease without esophagitis 08/30/2019   Hyperlipidemia associated with type 2 diabetes mellitus ([place]), goal < 70, with statin-induced myalgias, Lipid Clinic has been offered  06/14/2019   Vitamin D deficiency 10/24/2018   Type 2 diabetes mellitus ([place]), Rx Trulicity 03/54/6568   Hypertension associated with diabetes ([place]), Rx Losartan and Chlorthalidone 10/06/2018   Hypertensive cardiomegaly 09/12/2018   REFERRING DIAG: R15.9 (ICD-10-CM) -  Fecal incontinence   THERAPY DIAG:  Muscle weakness (generalized)   Other lack of coordination   Rationale for Evaluation and Treatment Rehabilitation   ONSET DATE: 2021   SUBJECTIVE:                                                                                                                                                                              SUBJECTIVE STATEMENT: Exercises are helping. On daily basis I am not leaking urine but I do leak with cough. I have a hemorrhoid. I strain hard to go to the bathroom.  I am using the  vaginal moisturizers.    PAIN:  Are you having pain? Yes NPRS scale: 7/10 Pain location:  back to the lower abdomen   Pain type: pressure Pain description: intermittent    Aggravating factors: sitting Relieving factors: getting up to walk   PRECAUTIONS: Other: cancer   WEIGHT BEARING RESTRICTIONS No   FALLS:  Has patient fallen in last 6 months? No   LIVING ENVIRONMENT: Lives with: lives with their family   OCCUPATION: work in [employer]   PLOF: [place] reduce leakage and pain, hold her urine better.    PERTINENT HISTORY:  Abdominal hysterectomy 07/15/04; C-section 1978; Carcinoma fallopian tube 07/15/04; peripheral neuropathy   BOWEL MOVEMENT Pain with bowel movement: Yes, after bowel movement there is burning Type of bowel movement:Type (Bristol Stool Scale) Type 4 and type 6 at the end, Frequency 1-3 days, and Strain Yes sometimes Fully empty rectum: No Leakage: Yes: with gas, if she does not get to the bathroom quickly Pads: No, keeps one in pocketbook, in locker at work and in car Fiber supplement: No   URINATION Pain with urination: No Fully empty bladder: No Stream: Strong and strong at night and during the day is average  Urgency: Yes:   Frequency: night time void every 2 hours; daytime every 3 hours Leakage: Walking to the bathroom, Coughing, Sneezing, Laughing, and Lifting   INTERCOURSE Pain  with intercourse:  gets urge to go to the bathroom, pain with deeper penetration Ability to have vaginal penetration:  Yes:   Climax: sometimes Marinoff Scale: 1/3   PREGNANCY Vaginal deliveries 1 C-section deliveries 1 Currently pregnant No        OBJECTIVE: (objective measures completed at initial evaluation unless otherwise dated)   (Copy Eval's Objective through Plan section here)     DIAGNOSTIC FINDINGS:  none   COGNITION:            Overall cognitive status: Within functional limits for tasks assessed                          SENSATION:            Light touch: Appears intact            Proprioception: Appears intact     LUMBAR SPECIAL TESTS:  none   FUNCTIONAL TESTS:  none                    POSTURE: No Significant postural limitations               PELVIC ALIGNMENT:   LUMBARAROM/PROM   A/PROM A/PROM  eval  Flexion full  Extension Decreased by 25%  Right lateral flexion Decreased by 25%  Left lateral flexion Decreased by 25%  Right rotation Decreased by 25%  Left rotation Decreased by 25%   (Blank rows = not tested)   LOWER EXTREMITY ROM:   Passive ROM Right eval Left eval  Hip internal rotation 5 5  Hip external rotation   40   (Blank rows = not tested)   LOWER EXTREMITY MMT:   MMT Right eval Left eval  Hip extension 4/5    Hip abduction 4/5       PALPATION:   General  abdominal scar has decreased movement; tenderness located in the left lower quadrant. Rib angle greater than 90, left gluteus medius, decreased movement of L1-L5, left gluteus medius, left quadratus  External Perineal Exam intact                             Internal Pelvic Floor tenderness along the left obturator internist, right iliococcygeus; decreased mobility of the posterior vaginal canal and puborectalis   Patient confirms identification and approves PT to assess internal pelvic floor and treatment Yes for the vaginal, no for the rectal due to  irritation.    PELVIC MMT:   MMT eval  Vaginal 3/5  Internal Anal Sphincter    External Anal Sphincter    Puborectalis    (Blank rows = not tested)         TONE: increased   PROLAPSE: none   TODAY'S TREATMENT  07/20/2022 Manual: Soft tissue mobilization:Circular motion to the abdomen to work on the peristalic motion of the intestines. Educated patient on how to perform the manual work to the abdomen.  Scar tissue mobilization: to the lower abdominal scar with tissue mobilization and educated patient on how to perform the scar massage at home. Myofascial release: release of the mesenteric root release around the diaphragm, release of the fascia working through the restrictions of the abdomen Exercises: Strengthening: Nustep level 4 for 5 minutes while assessing patient.  Therapeutic activities: Functional strengthening activities: educated patient on correct toileting with knees above hips and correct breathing to push the stool out.     PATIENT EDUCATION:  07/20/2022 Education details: Access Code: [place]; educated patient on scar massage, abdominal massage, and toileting Person educated: Patient Education method: Explanation, Demonstration, Tactile cues, Verbal cues, and Handouts Education comprehension: verbalized understanding, returned demonstration, verbal cues required, tactile cues required, and needs further education     HOME EXERCISE PROGRAM: 07/20/2022 Access Code: [email or web address] URL: [email or web address] Date: 06/10/2022 Prepared by: Earlie Counts   Exercises - Seated Pelvic Floor Contraction  - 3 x daily - 7 x weekly - 1 sets - 10 reps - 5 sec hold   ASSESSMENT:   CLINICAL IMPRESSION: Patient is a 66 y.o. female who was seen today for physical therapy evaluation and treatment for fecal incontinence. Patient had many restrictions in the abdomen with soft tissue. Patient has decreased mobility of scar. She has difficulty with with diaphragmatic  breathing.  Patient would benefit from skilled therapy to improve pelvic floor coordination and strength to reduce urinary and fecal leakage while reducing pain.      OBJECTIVE IMPAIRMENTS decreased activity tolerance, decreased coordination, decreased endurance, decreased mobility, decreased strength, increased fascial restrictions, and pain.    ACTIVITY LIMITATIONS lifting, sitting, continence, and toileting   PARTICIPATION LIMITATIONS: interpersonal relationship, community activity, and occupation   PERSONAL FACTORS Age, Fitness, and 1-2 comorbidities: Abdominal hysterectomy 07/15/04; C-section 1978; Carcinoma fallopian tube 07/15/04; peripheral neuropathy  are also affecting patient's functional outcome.    REHAB POTENTIAL: Excellent   CLINICAL DECISION MAKING: Evolving/moderate complexity   EVALUATION COMPLEXITY: Moderate     GOALS: Goals reviewed with patient? Yes   SHORT TERM GOALS: Target date: 07/08/2022   Patient independent with scar massage.  Baseline: Goal status: Met 07/20/2022   2.  Patient is able to contract her abdominals with reduction of her rib cage angle.  Baseline:  Goal status: INITIAL   3.  Patient educated on vaginal moisturizers and lubricants to improve vaginal health.  Baseline:  Goal status: INITIAL   4.  Patient educated on urge to void  Baseline:  Goal status: INITIAL     LONG TERM GOALS: Target  date: 09/02/2022    Patient independent with her HEP for pelvic floor and core strength to reduce leakage and pain.  Baseline:  Goal status: INITIAL   2.  Patient reports she is able to walk to the bathroom and not leak stool due to the ability to contract the anus and increased endurance.  Baseline:  Goal status: INITIAL   3.  Patient able to walk to the bathroom without leaking urine due to improved pelvic floor coordination and holding a contraction >/= 10 sec.  Baseline:  Goal status: INITIAL   4.  Patient reports she is able to pass gas  without leaking stool due to improved pelvic floor strength.  Baseline:  Goal status: INITIAL   5.  Patient reports her pain from the back to the abdomen decreased </= 2/10 due to improved lumbar mobility and reduction of restrictions of the lower abdomen.  Baseline:  Goal status: INITIAL   PLAN: PT FREQUENCY: 1x/week   PT DURATION: 12 weeks   PLANNED INTERVENTIONS: Therapeutic exercises, Therapeutic activity, Neuromuscular re-education, Patient/Family education, Self Care, Joint mobilization, Dry Needling, Electrical stimulation, Spinal mobilization, Cryotherapy, Moist heat, scar mobilization, Taping, Biofeedback, and Manual therapy   PLAN FOR NEXT SESSION: manual work to the abdomen; scar massage education, manual work to the vaginal canal, lubricant education, diaphragmatic breathing, abdominal contraction  Earlie Counts, PT 07/20/22 8:57 AM

## 2022-07-20 NOTE — Patient Instructions (Signed)
Toileting Techniques for Bowel Movements (Defecation) Using your belly (abdomen) and pelvic floor muscles to have a bowel movement is usually instinctive.  Sometimes people can have problems with these muscles and have to relearn proper defecation (emptying) techniques.  If you have weakness in your muscles, organs that are falling out, decreased sensation in your pelvis, or ignore your urge to go, you may find yourself straining to have a bowel movement.  You are straining if you are: holding your breath or taking in a huge gulp of air and holding it  keeping your lips and jaw tensed and closed tightly turning red in the face because of excessive pushing or forcing developing or worsening your  hemorrhoids getting faint while pushing not emptying completely and have to defecate many times a day  If you are straining, you are actually making it harder for yourself to have a bowel movement.  Many people find they are pulling up with the pelvic floor muscles and closing off instead of opening the anus. Due to lack pelvic floor relaxation and coordination the abdominal muscles, one has to work harder to push the feces out.  Many people have never been taught how to defecate efficiently and effectively.  Notice what happens to your body when you are having a bowel movement.  While you are sitting on the toilet pay attention to the following areas: Jaw and mouth position Angle of your hips   Whether your feet touch the ground or not Arm placement  Spine position Waist Belly tension Anus (opening of the anal canal)  An Evacuation/Defecation Plan   Here are the 4 basic points:  Lean forward enough for your elbows to rest on your knees Support your feet on the floor or use a low stool if your feet don't touch the floor  Push out your belly as if you have swallowed a beach ball--you should feel a widening of your waist Open and relax your pelvic floor muscles, rather than tightening around the  anus       The following conditions my require modifications to your toileting posture:  If you have had surgery in the past that limits your back, hip, pelvic, knee or ankle flexibility Constipation   Your healthcare practitioner may make the following additional suggestions and adjustments:  Sit on the toilet  a) Make sure your feet are supported. b) Notice your hip angle and spine position--most people find it effective to lean forward or raise their knees, which can help the muscles around the anus to relax  c) When you lean forward, place your forearms on your thighs for support  Relax suggestions a) Breath deeply in through your nose and out slowly through your mouth as if you are smelling the flowers and blowing out the candles. b) To become aware of how to relax your muscles, contracting and releasing muscles can be helpful.  Pull your pelvic floor muscles in tightly by using the image of holding back gas, or closing around the anus (visualize making a circle smaller) and lifting the anus up and in.  Then release the muscles and your anus should drop down and feel open. Repeat 5 times ending with the feeling of relaxation. c) Keep your pelvic floor muscles relaxed; let your belly bulge out. d) The digestive tract starts at the mouth and ends at the anal opening, so be sure to relax both ends of the tube.  Place your tongue on the roof of your mouth with your teeth  separated.  This helps relax your mouth and will help to relax the anus at the same time.  Empty (defecation) a) Keep your pelvic floor and sphincter relaxed, then bulge your anal muscles.  Make the anal opening wide.  b) Stick your belly out as if you have swallowed a beach ball. c) Make your belly wall hard using your belly muscles while continuing to breathe. Doing this makes it easier to open your anus. d) Breath out and give a grunt (or try using other sounds such as ahhhh, shhhhh, ohhhh or  grrrrrrr).  4) Finish a) As you finish your bowel movement, pull the pelvic floor muscles up and in.  This will leave your anus in the proper place rather than remaining pushed out and down. If you leave your anus pushed out and down, it will start to feel as though that is normal and give you incorrect signals about needing to have a bowel movement.  About Abdominal Massage  Abdominal massage, also called external colon massage, is a self-treatment circular massage technique that can reduce and eliminate gas and ease constipation. The colon naturally contracts in waves in a clockwise direction starting from inside the right hip, moving up toward the ribs, across the belly, and down inside the left hip.  When you perform circular abdominal massage, you help stimulate your colon's normal wave pattern of movement called peristalsis.  It is most beneficial when done after eating.  Positioning You can practice abdominal massage with oil while lying down, or in the shower with soap.  Some people find that it is just as effective to do the massage through clothing while sitting or standing.  How to Massage Start by placing your finger tips or knuckles on your right side, just inside your hip bone.  Make small circular movements while you move upward toward your rib cage.   Once you reach the bottom right side of your rib cage, take your circular movements across to the left side of the bottom of your rib cage.  Next, move downward until you reach the inside of your left hip bone.  This is the path your feces travel in your colon. Continue to perform your abdominal massage in this pattern for 10 minutes each day.     You can apply as much pressure as is comfortable in your massage.  Start gently and build pressure as you continue to practice.  Notice any areas of pain as you massage; areas of slight pain may be relieved as you massage, but if you have areas of significant or intense pain, consult with  your healthcare provider.  Other Considerations General physical activity including bending and stretching can have a beneficial massage-like effect on the colon.  Deep breathing can also stimulate the colon because breathing deeply activates the same nervous system that supplies the colon.   Abdominal massage should always be used in combination with a bowel-conscious diet that is high in the proper type of fiber for you, fluids (primarily water), and a regular exercise program.   [facility] [address] [place], [place] [address] Phone # [phone] Fax [phone]

## 2022-07-29 ENCOUNTER — Ambulatory Visit: Payer: 59 | Admitting: Physical Therapy

## 2022-07-29 ENCOUNTER — Encounter: Payer: Self-pay | Admitting: Physical Therapy

## 2022-07-29 DIAGNOSIS — M6281 Muscle weakness (generalized): Secondary | ICD-10-CM | POA: Diagnosis not present

## 2022-07-29 DIAGNOSIS — R278 Other lack of coordination: Secondary | ICD-10-CM

## 2022-07-29 NOTE — Therapy (Signed)
OUTPATIENT PHYSICAL THERAPY TREATMENT NOTE   Patient Name: Gail Crawford MRN: [id number] DOB:1956-04-16, 66 y.o., female Today's Date: 07/29/2022  PCP:  Chesley Noon, MD  REFERRING PROVIDER: Briscoe Deutscher, DO    END OF SESSION:   PT End of Session - 07/29/22 1359     Visit Number 3    Date for PT Re-Evaluation 09/02/22    Authorization Type [employer]    PT Start Time 1400    PT Stop Time 1440    PT Time Calculation (min) 40 min    Activity Tolerance Patient tolerated treatment well    Behavior During Therapy [facility] for tasks assessed/performed             Past Medical History:  Diagnosis Date   Asthma 01-2004   Back pain    Carcinoma of fallopian tube, 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   Fallopian tube cancer, 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    HTN (hypertension)    Joint pain    Leg edema    Peripheral neuropathy 01/15/2012   Past Surgical History:  Procedure Laterality Date   ABDOMINAL HYSTERECTOMY  07-15-04   TYPE II RADICAL HYSTERECTOMY WITH BSO   CESAREAN SECTION  1978   MASS EXCISION Left 06/11/2015   Procedure: EXCISION LEFT ABDOMINAL WALL MASS 6X3CM;  Surgeon: Stark Klein, MD;  Location: [facility];  Service: General;  Laterality: Left;   ORTHOPEDIC SURGERY     RIGHT SHOULDER AND ELBOW AND RIGHT FOOT   Patient Active Problem List   Diagnosis Date Noted   Metabolic syndrome 32/54/9826   Primary osteoarthritis of right knee 01/30/2020   Myalgia due to statin 12/13/2019   Gastroesophageal reflux disease without esophagitis 08/30/2019   Hyperlipidemia associated with type 2 diabetes mellitus ([place]), goal < 70, with statin-induced myalgias, [facility] Clinic has been offered  06/14/2019   Vitamin D deficiency 10/24/2018   Type 2 diabetes mellitus ([place]), Rx Trulicity 41/58/3094   Hypertension associated with diabetes ([place]), Rx Losartan and Chlorthalidone 10/06/2018   Hypertensive cardiomegaly 09/12/2018   REFERRING DIAG: R15.9 (ICD-10-CM) -  Fecal incontinence   THERAPY DIAG:  Muscle weakness (generalized)   Other lack of coordination   Rationale for Evaluation and Treatment Rehabilitation   ONSET DATE: 2021   SUBJECTIVE:                                                                                                                                                                               SUBJECTIVE STATEMENT: I am getting checked out for pneumonia. The last treatment helped with having regular bowel movements. I am  constipated today. I have been leaking urine with coughing. I have not  had leakage with gas since last visit.    PAIN:  Are you having pain? Yes NPRS scale: 7/10 Pain location:  back to the lower abdomen   Pain type: pressure Pain description: intermittent    Aggravating factors: sitting Relieving factors: getting up to walk   PRECAUTIONS: Other: cancer   WEIGHT BEARING RESTRICTIONS No   FALLS:  Has patient fallen in last 6 months? No   LIVING ENVIRONMENT: Lives with: lives with their family   OCCUPATION: work in [employer]   PLOF: [place] reduce leakage and pain, hold her urine better.    PERTINENT HISTORY:  Abdominal hysterectomy 07/15/04; C-section 1978; Carcinoma fallopian tube 07/15/04; peripheral neuropathy   BOWEL MOVEMENT Pain with bowel movement: Yes, after bowel movement there is burning Type of bowel movement:Type (Bristol Stool Scale) Type 4 and type 6 at the end, Frequency 1-3 days, and Strain Yes sometimes Fully empty rectum: No Leakage: Yes: with gas, if she does not get to the bathroom quickly Pads: No, keeps one in pocketbook, in locker at work and in car Fiber supplement: No   URINATION Pain with urination: No Fully empty bladder: No Stream: Strong and strong at night and during the day is average  Urgency: Yes:   Frequency: night time void every 2 hours; daytime every 3 hours Leakage: Walking to the bathroom, Coughing, Sneezing,  Laughing, and Lifting   INTERCOURSE Pain with intercourse:  gets urge to go to the bathroom, pain with deeper penetration Ability to have vaginal penetration:  Yes:   Climax: sometimes Marinoff Scale: 1/3   PREGNANCY Vaginal deliveries 1 C-section deliveries 1 Currently pregnant No        OBJECTIVE: (objective measures completed at initial evaluation unless otherwise dated)     (Copy Eval's Objective through Plan section here)     DIAGNOSTIC FINDINGS:  none   COGNITION:            Overall cognitive status: Within functional limits for tasks assessed                          SENSATION:            Light touch: Appears intact            Proprioception: Appears intact     LUMBAR SPECIAL TESTS:  none   FUNCTIONAL TESTS:  none                    POSTURE: No Significant postural limitations               PELVIC ALIGNMENT:   LUMBARAROM/PROM   A/PROM A/PROM  eval  Flexion full  Extension Decreased by 25%  Right lateral flexion Decreased by 25%  Left lateral flexion Decreased by 25%  Right rotation Decreased by 25%  Left rotation Decreased by 25%   (Blank rows = not tested)   LOWER EXTREMITY ROM:   Passive ROM Right eval Left eval  Hip internal rotation 5 5  Hip external rotation   40   (Blank rows = not tested)   LOWER EXTREMITY MMT:   MMT Right eval Left eval  Hip extension 4/5    Hip abduction 4/5       PALPATION:   General  abdominal scar has decreased movement; tenderness located in the left lower quadrant. Rib angle greater than 90, left gluteus medius, decreased movement of L1-L5, left gluteus medius, left quadratus  External Perineal Exam intact                             Internal Pelvic Floor tenderness along the left obturator internist, right iliococcygeus; decreased mobility of the posterior vaginal canal and puborectalis   Patient confirms identification and approves PT to assess internal pelvic floor and treatment Yes  for the vaginal, no for the rectal due to irritation.    PELVIC MMT:   MMT eval 07/29/2022  Vaginal 3/5 3/5  Internal Anal Sphincter     External Anal Sphincter     Puborectalis     (Blank rows = not tested)         TONE: increased   PROLAPSE: none   TODAY'S TREATMENT  07/29/2022 Manual: Soft tissue mobilization: Circular motion to the abdomen to work on the peristalic motion of the intestines Scar tissue mobilization: to the lower abdominal scar with tissue mobilization and educated patient on how to perform the scar massage at home.  Myofascial release: release of the mesenteric root release around the diaphragm, release of the fascia working through the restrictions of the abdomen Release along the pubic rami, along the urogenital diaphragm, superior perineal and ischiocavernosus Neuromuscular re-education: Pelvic floor contraction training: Pelvic floor contraction with coughing Pelvic floor contraction in supine with tactile cues Exercises: Strengthening: Nustep level 4 for 5 minutes while assessing patient    07/20/2022 Manual: Soft tissue mobilization:Circular motion to the abdomen to work on the peristalic motion of the intestines. Educated patient on how to perform the manual work to the abdomen.  Scar tissue mobilization: to the lower abdominal scar with tissue mobilization and educated patient on how to perform the scar massage at home.  Myofascial release: release of the mesenteric root release around the diaphragm, release of the fascia working through the restrictions of the abdomen Exercises: Strengthening: Nustep level 4 for 5 minutes while assessing patient.  Therapeutic activities: Functional strengthening activities: educated patient on correct toileting with knees above hips and correct breathing to push the stool out.        PATIENT EDUCATION:  07/20/2022 Education details: Access Code: [place]; educated patient on scar massage, abdominal  massage, and toileting Person educated: Patient Education method: Explanation, Demonstration, Tactile cues, Verbal cues, and Handouts Education comprehension: verbalized understanding, returned demonstration, verbal cues required, tactile cues required, and needs further education     HOME EXERCISE PROGRAM: 07/20/2022 Access Code: [email or web address] URL: [email or web address] Date: 06/10/2022 Prepared by: Earlie Counts   Exercises - Seated Pelvic Floor Contraction  - 3 x daily - 7 x weekly - 1 sets - 10 reps - 5 sec hold   ASSESSMENT:   CLINICAL IMPRESSION: Patient is a 66 y.o. female who was seen today for physical therapy evaluation and treatment for fecal incontinence.  Pelvic floor strength is 3/5. She will leak urine when she coughs. Patient was instructed to contract pelvic floor when she coughs. Patient has not had fecal leakage since last visit. She is able to have a bowel movement after therapist does abdominal work. Patient able to contract the abdominals without bulging. Patient would benefit from skilled therapy to improve pelvic floor coordination and strength to reduce urinary and fecal leakage while reducing pain.      OBJECTIVE IMPAIRMENTS decreased activity tolerance, decreased coordination, decreased endurance, decreased mobility, decreased strength, increased fascial restrictions, and pain.    ACTIVITY LIMITATIONS lifting, sitting, continence, and toileting   PARTICIPATION LIMITATIONS: interpersonal relationship,  community activity, and occupation   PERSONAL FACTORS Age, Fitness, and 1-2 comorbidities: Abdominal hysterectomy 07/15/04; C-section 1978; Carcinoma fallopian tube 07/15/04; peripheral neuropathy  are also affecting patient's functional outcome.    REHAB POTENTIAL: Excellent   CLINICAL DECISION MAKING: Evolving/moderate complexity   EVALUATION COMPLEXITY: Moderate     GOALS: Goals reviewed with patient? Yes   SHORT TERM GOALS: Target date:  07/08/2022   Patient independent with scar massage.  Baseline: Goal status: Met 07/20/2022   2.  Patient is able to contract her abdominals with reduction of her rib cage angle.  Baseline:  Goal status: Met 07/29/2022   3.  Patient educated on vaginal moisturizers and lubricants to improve vaginal health.  Baseline:  Goal status: Met 07/29/2022   4.  Patient educated on urge to void  Baseline:  Goal status: INITIAL     LONG TERM GOALS: Target date: 09/02/2022    Patient independent with her HEP for pelvic floor and core strength to reduce leakage and pain.  Baseline:  Goal status: INITIAL   2.  Patient reports she is able to walk to the bathroom and not leak stool due to the ability to contract the anus and increased endurance.  Baseline:  Goal status: INITIAL   3.  Patient able to walk to the bathroom without leaking urine due to improved pelvic floor coordination and holding a contraction >/= 10 sec.  Baseline:  Goal status: INITIAL   4.  Patient reports she is able to pass gas without leaking stool due to improved pelvic floor strength.  Baseline:  Goal status: INITIAL   5.  Patient reports her pain from the back to the abdomen decreased </= 2/10 due to improved lumbar mobility and reduction of restrictions of the lower abdomen.  Baseline:  Goal status: INITIAL   PLAN: PT FREQUENCY: 1x/week   PT DURATION: 12 weeks   PLANNED INTERVENTIONS: Therapeutic exercises, Therapeutic activity, Neuromuscular re-education, Patient/Family education, Self Care, Joint mobilization, Dry Needling, Electrical stimulation, Spinal mobilization, Cryotherapy, Moist heat, scar mobilization, Taping, Biofeedback, and Manual therapy   PLAN FOR NEXT SESSION: manual work to the abdomen; manual work to the vaginal canal, abdominal contraction  Earlie Counts, PT 07/29/22 2:46 PM

## 2022-08-05 ENCOUNTER — Ambulatory Visit: Payer: 59 | Attending: Family Medicine | Admitting: Physical Therapy

## 2022-08-05 ENCOUNTER — Encounter: Payer: Self-pay | Admitting: Physical Therapy

## 2022-08-05 DIAGNOSIS — M6281 Muscle weakness (generalized): Secondary | ICD-10-CM | POA: Diagnosis present

## 2022-08-05 DIAGNOSIS — R278 Other lack of coordination: Secondary | ICD-10-CM | POA: Diagnosis present

## 2022-08-05 NOTE — Therapy (Signed)
OUTPATIENT PHYSICAL THERAPY TREATMENT NOTE   Patient Name: Gail Crawford MRN: [id number] DOB:1956/05/08, 66 y.o., female Today's Date: 08/05/2022  PCP: Chesley Noon, MD   REFERRING PROVIDER: Briscoe Deutscher, DO   END OF SESSION:   PT End of Session - 08/05/22 1400     Visit Number 4    Date for PT Re-Evaluation 09/02/22    Authorization Type [employer]    PT Start Time 1400    PT Stop Time 1440    PT Time Calculation (min) 40 min    Activity Tolerance Patient tolerated treatment well    Behavior During Therapy [facility] for tasks assessed/performed             Past Medical History:  Diagnosis Date   Asthma 01-2004   Back pain    Carcinoma of fallopian tube, 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   Fallopian tube cancer, 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    HTN (hypertension)    Joint pain    Leg edema    Peripheral neuropathy 01/15/2012   Past Surgical History:  Procedure Laterality Date   ABDOMINAL HYSTERECTOMY  07-15-04   TYPE II RADICAL HYSTERECTOMY WITH BSO   CESAREAN SECTION  1978   MASS EXCISION Left 06/11/2015   Procedure: EXCISION LEFT ABDOMINAL WALL MASS 6X3CM;  Surgeon: Stark Klein, MD;  Location: [place];  Service: General;  Laterality: Left;   ORTHOPEDIC SURGERY     RIGHT SHOULDER AND ELBOW AND RIGHT FOOT   Patient Active Problem List   Diagnosis Date Noted   Metabolic syndrome 02/21/7627   Primary osteoarthritis of right knee 01/30/2020   Myalgia due to statin 12/13/2019   Gastroesophageal reflux disease without esophagitis 08/30/2019   Hyperlipidemia associated with type 2 diabetes mellitus ([place]), goal < 70, with statin-induced myalgias, [facility] Clinic has been offered  06/14/2019   Vitamin D deficiency 10/24/2018   Type 2 diabetes mellitus ([place]), Rx Trulicity 31/51/7616   Hypertension associated with diabetes ([place]), Rx Losartan and Chlorthalidone 10/06/2018   Hypertensive cardiomegaly 09/12/2018   REFERRING DIAG: R15.9 (ICD-10-CM) -  Fecal incontinence   THERAPY DIAG:  Muscle weakness (generalized)   Other lack of coordination   Rationale for Evaluation and Treatment Rehabilitation   ONSET DATE: 2021   SUBJECTIVE:                                                                                                                                                                               SUBJECTIVE STATEMENT: I did not have pneumonia. I have been doing the scar massage.  I had a bowel movement today. No leakage when passing gas. I do good daily with urinary leakage. Bad  cough will make me leak.    PAIN:  Are you having pain? Yes NPRS scale: 5/10 Pain location:  back to the lower abdomen   Pain type: pressure Pain description: intermittent    Aggravating factors: sitting Relieving factors: getting up to walk   PRECAUTIONS: Other: cancer   WEIGHT BEARING RESTRICTIONS No   FALLS:  Has patient fallen in last 6 months? No   LIVING ENVIRONMENT: Lives with: lives with their family   OCCUPATION: work in [employer]   PLOF: [place] reduce leakage and pain, hold her urine better.    PERTINENT HISTORY:  Abdominal hysterectomy 07/15/04; C-section 1978; Carcinoma fallopian tube 07/15/04; peripheral neuropathy   BOWEL MOVEMENT Pain with bowel movement: Yes, after bowel movement there is burning Type of bowel movement:Type (Bristol Stool Scale) Type 4 and type 6 at the end, Frequency 1-3 days, and Strain Yes sometimes Fully empty rectum: No Leakage: Yes: with gas, if she does not get to the bathroom quickly Pads: No, keeps one in pocketbook, in locker at work and in car Fiber supplement: No   URINATION Pain with urination: No Fully empty bladder: No Stream: Strong and strong at night and during the day is average  Urgency: Yes:   Frequency: night time void every 2 hours; daytime every 3 hours Leakage: Walking to the bathroom, Coughing, Sneezing, Laughing, and Lifting    INTERCOURSE Pain with intercourse:  gets urge to go to the bathroom, pain with deeper penetration Ability to have vaginal penetration:  Yes:   Climax: sometimes Marinoff Scale: 1/3   PREGNANCY Vaginal deliveries 1 C-section deliveries 1 Currently pregnant No        OBJECTIVE: (objective measures completed at initial evaluation unless otherwise dated)     DIAGNOSTIC FINDINGS:  none   COGNITION:            Overall cognitive status: Within functional limits for tasks assessed                          SENSATION:            Light touch: Appears intact            Proprioception: Appears intact     LUMBAR SPECIAL TESTS:  none   FUNCTIONAL TESTS:  none                    POSTURE: No Significant postural limitations               PELVIC ALIGNMENT:   LUMBARAROM/PROM   A/PROM A/PROM  eval  Flexion full  Extension Decreased by 25%  Right lateral flexion Decreased by 25%  Left lateral flexion Decreased by 25%  Right rotation Decreased by 25%  Left rotation Decreased by 25%   (Blank rows = not tested)   LOWER EXTREMITY ROM:   Passive ROM Right eval Left eval  Hip internal rotation 5 5  Hip external rotation   40   (Blank rows = not tested)   LOWER EXTREMITY MMT:   MMT Right eval Left eval  Hip extension 4/5    Hip abduction 4/5       PALPATION:   General  abdominal scar has decreased movement; tenderness located in the left lower quadrant. Rib angle greater than 90, left gluteus medius, decreased movement of L1-L5, left gluteus medius, left quadratus  External Perineal Exam intact                             Internal Pelvic Floor tenderness along the left obturator internist, right iliococcygeus; decreased mobility of the posterior vaginal canal and puborectalis   Patient confirms identification and approves PT to assess internal pelvic floor and treatment Yes for the vaginal, no for the rectal due to irritation.    PELVIC MMT:   MMT  eval 07/29/2022  Vaginal 3/5 3/5  Internal Anal Sphincter      External Anal Sphincter      Puborectalis      (Blank rows = not tested)         TONE: increased   PROLAPSE: none   TODAY'S TREATMENT  08/05/2022 Manual: Soft tissue mobilization:  Manual work to the left hip flexor and quadriceps after she had a cramp.  Scar tissue mobilization:  Manual work to the lower part of the scar Myofascial release: Fascial release on the lower quadrant in right sidely to work on the restrictions Fascial release of the sides of the scar to improve tissue mobility Neuromuscular re-education: Core facilitation: Supine core engagement with hip flexion but had difficulty with lifting left hip due to cramp Exercises: Stretches/mobility: Open book stretch in right sidely 15 times Supine left hip flexor stretch with left leg off the mat and therapist performs manual work to the hip flexor by the groin Sit on mat and stretch the left hip flexor Strengthening: Nustep level 4 for 5 minutes while assessing patient    07/29/2022 Manual: Soft tissue mobilization: Circular motion to the abdomen to work on the peristalic motion of the intestines Scar tissue mobilization: to the lower abdominal scar with tissue mobilization and educated patient on how to perform the scar massage at home.  Myofascial release: release of the mesenteric root release around the diaphragm, release of the fascia working through the restrictions of the abdomen Release along the pubic rami, along the urogenital diaphragm, superior perineal and ischiocavernosus Neuromuscular re-education: Pelvic floor contraction training: Pelvic floor contraction with coughing Pelvic floor contraction in supine with tactile cues Exercises: Strengthening: Nustep level 4 for 5 minutes while assessing patient     07/20/2022 Manual: Soft tissue mobilization:Circular motion to the abdomen to work on the peristalic motion of the  intestines. Educated patient on how to perform the manual work to the abdomen.  Scar tissue mobilization: to the lower abdominal scar with tissue mobilization and educated patient on how to perform the scar massage at home.  Myofascial release: release of the mesenteric root release around the diaphragm, release of the fascia working through the restrictions of the abdomen Exercises: Strengthening: Nustep level 4 for 5 minutes while assessing patient.  Therapeutic activities: Functional strengthening activities: educated patient on correct toileting with knees above hips and correct breathing to push the stool out.        PATIENT EDUCATION:  07/20/2022 Education details: Access Code: [place]; educated patient on scar massage, abdominal massage, and toileting Person educated: Patient Education method: Explanation, Demonstration, Tactile cues, Verbal cues, and Handouts Education comprehension: verbalized understanding, returned demonstration, verbal cues required, tactile cues required, and needs further education     HOME EXERCISE PROGRAM: 07/20/2022 Access Code: [email or web address] URL: [email or web address] Date: 06/10/2022 Prepared by: Earlie Counts   Exercises - Seated Pelvic Floor Contraction  - 3 x daily - 7 x weekly - 1 sets - 10 reps - 5 sec  hold   ASSESSMENT:   CLINICAL IMPRESSION: Patient is a 66 y.o. female who was seen today for physical therapy evaluation and treatment for fecal incontinence.  Patient is able to pass gas without fecal leakage. She is able to walk to the bathroom without leakage. She only leaks urine when she coughs hard. Patient left hip flexor is tight and will cramp with supine hip flexion. She continues to have tenderness located in the left lower quadrant.   Patient would benefit from skilled therapy to improve pelvic floor coordination and strength to reduce urinary and fecal leakage while reducing pain.      OBJECTIVE IMPAIRMENTS decreased  activity tolerance, decreased coordination, decreased endurance, decreased mobility, decreased strength, increased fascial restrictions, and pain.    ACTIVITY LIMITATIONS lifting, sitting, continence, and toileting   PARTICIPATION LIMITATIONS: interpersonal relationship, community activity, and occupation   PERSONAL FACTORS Age, Fitness, and 1-2 comorbidities: Abdominal hysterectomy 07/15/04; C-section 1978; Carcinoma fallopian tube 07/15/04; peripheral neuropathy  are also affecting patient's functional outcome.    REHAB POTENTIAL: Excellent   CLINICAL DECISION MAKING: Evolving/moderate complexity   EVALUATION COMPLEXITY: Moderate     GOALS: Goals reviewed with patient? Yes   SHORT TERM GOALS: Target date: 07/08/2022   Patient independent with scar massage.  Baseline: Goal status: Met 07/20/2022   2.  Patient is able to contract her abdominals with reduction of her rib cage angle.  Baseline:  Goal status: Met 07/29/2022   3.  Patient educated on vaginal moisturizers and lubricants to improve vaginal health.  Baseline:  Goal status: Met 07/29/2022   4.  Patient educated on urge to void  Baseline:  Goal status: INITIAL     LONG TERM GOALS: Target date: 09/02/2022    Patient independent with her HEP for pelvic floor and core strength to reduce leakage and pain.  Baseline:  Goal status: INITIAL   2.  Patient reports she is able to walk to the bathroom and not leak stool due to the ability to contract the anus and increased endurance.  Baseline:  Goal status: INITIAL   3.  Patient able to walk to the bathroom without leaking urine due to improved pelvic floor coordination and holding a contraction >/= 10 sec.  Baseline:  Goal status: Met 08/05/2022   4.  Patient reports she is able to pass gas without leaking stool due to improved pelvic floor strength.  Baseline:  Goal status: Met 08/05/2022   5.  Patient reports her pain from the back to the abdomen decreased </= 2/10  due to improved lumbar mobility and reduction of restrictions of the lower abdomen.  Baseline:  Goal status: INITIAL   PLAN: PT FREQUENCY: 1x/week   PT DURATION: 12 weeks   PLANNED INTERVENTIONS: Therapeutic exercises, Therapeutic activity, Neuromuscular re-education, Patient/Family education, Self Care, Joint mobilization, Dry Needling, Electrical stimulation, Spinal mobilization, Cryotherapy, Moist heat, scar mobilization, Taping, Biofeedback, and Manual therapy   PLAN FOR NEXT SESSION: manual work to the abdomen on the left; left hip flexor, abdominal contraction, dry needling to left quads  Earlie Counts, PT 08/05/22 2:46 PM

## 2022-08-12 ENCOUNTER — Encounter: Payer: Self-pay | Admitting: Physical Therapy

## 2022-08-12 ENCOUNTER — Ambulatory Visit: Payer: 59 | Admitting: Physical Therapy

## 2022-08-12 DIAGNOSIS — R278 Other lack of coordination: Secondary | ICD-10-CM

## 2022-08-12 DIAGNOSIS — M6281 Muscle weakness (generalized): Secondary | ICD-10-CM | POA: Diagnosis not present

## 2022-08-12 NOTE — Therapy (Signed)
OUTPATIENT PHYSICAL THERAPY TREATMENT NOTE   Patient Name: Gail Crawford MRN: [id number] DOB:[birth year], 66 y.o., female Today's Date: 08/12/2022  PCP: Chesley Noon, MD  REFERRING PROVIDER: Briscoe Deutscher, DO   END OF SESSION:   PT End of Session - 08/12/22 1406     Visit Number 5    Date for PT Re-Evaluation 09/02/22    Authorization Type [employer]    PT Start Time 1400    PT Stop Time 1440    PT Time Calculation (min) 40 min    Activity Tolerance Patient tolerated treatment well    Behavior During Therapy [facility] for tasks assessed/performed             Past Medical History:  Diagnosis Date   Asthma 01-2004   Back pain    Carcinoma of fallopian tube, 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   Fallopian tube cancer, 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    HTN (hypertension)    Joint pain    Leg edema    Peripheral neuropathy 01/15/2012   Past Surgical History:  Procedure Laterality Date   ABDOMINAL HYSTERECTOMY  07-15-04   TYPE II RADICAL HYSTERECTOMY WITH BSO   CESAREAN SECTION  1978   MASS EXCISION Left 06/11/2015   Procedure: EXCISION LEFT ABDOMINAL WALL MASS 6X3CM;  Surgeon: Stark Klein, MD;  Location: [facility];  Service: General;  Laterality: Left;   ORTHOPEDIC SURGERY     RIGHT SHOULDER AND ELBOW AND RIGHT FOOT   Patient Active Problem List   Diagnosis Date Noted   Metabolic syndrome 24/05/7352   Primary osteoarthritis of right knee 01/30/2020   Myalgia due to statin 12/13/2019   Gastroesophageal reflux disease without esophagitis 08/30/2019   Hyperlipidemia associated with type 2 diabetes mellitus ([place]), goal < 70, with statin-induced myalgias, [facility] Clinic has been offered  06/14/2019   Vitamin D deficiency 10/24/2018   Type 2 diabetes mellitus ([place]), Rx Trulicity 29/92/4268   Hypertension associated with diabetes ([place]), Rx Losartan and Chlorthalidone 10/06/2018   Hypertensive cardiomegaly 09/12/2018   REFERRING DIAG: R15.9 (ICD-10-CM) -  Fecal incontinence   THERAPY DIAG:  Muscle weakness (generalized)   Other lack of coordination   Rationale for Evaluation and Treatment Rehabilitation   ONSET DATE: 2021   SUBJECTIVE:                                                                                                                                                                               SUBJECTIVE STATEMENT: I have been very constipated. I had to take a pill to have a bowel movement. The stool gets to a point and does not move. The stool is right at the opening  to come out. I am drink more water.    PAIN:  Are you having pain? Yes NPRS scale: 5/10 Pain location:  back to the lower abdomen   Pain type: pressure Pain description: intermittent    Aggravating factors: sitting Relieving factors: getting up to walk   PRECAUTIONS: Other: cancer   WEIGHT BEARING RESTRICTIONS No   FALLS:  Has patient fallen in last 6 months? No   LIVING ENVIRONMENT: Lives with: lives with their family   OCCUPATION: work in [employer]   PLOF: [place] reduce leakage and pain, hold her urine better.    PERTINENT HISTORY:  Abdominal hysterectomy 07/15/04; C-section 1978; Carcinoma fallopian tube 07/15/04; peripheral neuropathy   BOWEL MOVEMENT Pain with bowel movement: Yes, after bowel movement there is burning Type of bowel movement:Type (Bristol Stool Scale) Type 4 and type 6 at the end, Frequency 1-3 days, and Strain Yes sometimes Fully empty rectum: No Leakage: Yes: with gas, if she does not get to the bathroom quickly Pads: No, keeps one in pocketbook, in locker at work and in car Fiber supplement: No   URINATION Pain with urination: No Fully empty bladder: No Stream: Strong and strong at night and during the day is average  Urgency: Yes:   Frequency: night time void every 2 hours; daytime every 3 hours Leakage: Walking to the bathroom, Coughing, Sneezing, Laughing, and Lifting    INTERCOURSE Pain with intercourse:  gets urge to go to the bathroom, pain with deeper penetration Ability to have vaginal penetration:  Yes:   Climax: sometimes Marinoff Scale: 1/3   PREGNANCY Vaginal deliveries 1 C-section deliveries 1 Currently pregnant No        OBJECTIVE: (objective measures completed at initial evaluation unless otherwise dated)     DIAGNOSTIC FINDINGS:  none   COGNITION:            Overall cognitive status: Within functional limits for tasks assessed                          SENSATION:            Light touch: Appears intact            Proprioception: Appears intact     LUMBAR SPECIAL TESTS:  none   FUNCTIONAL TESTS:  none                    POSTURE: No Significant postural limitations               PELVIC ALIGNMENT:   LUMBARAROM/PROM   A/PROM A/PROM  eval  Flexion full  Extension Decreased by 25%  Right lateral flexion Decreased by 25%  Left lateral flexion Decreased by 25%  Right rotation Decreased by 25%  Left rotation Decreased by 25%   (Blank rows = not tested)   LOWER EXTREMITY ROM:   Passive ROM Right eval Left eval  Hip internal rotation 5 5  Hip external rotation   40   (Blank rows = not tested)   LOWER EXTREMITY MMT:   MMT Right eval Left eval  Hip extension 4/5    Hip abduction 4/5       PALPATION:   General  abdominal scar has decreased movement; tenderness located in the left lower quadrant. Rib angle greater than 90, left gluteus medius, decreased movement of L1-L5, left gluteus medius, left quadratus  External Perineal Exam intact                             Internal Pelvic Floor tenderness along the left obturator internist, right iliococcygeus; decreased mobility of the posterior vaginal canal and puborectalis   Patient confirms identification and approves PT to assess internal pelvic floor and treatment Yes for the vaginal, no for the rectal due to irritation.    PELVIC MMT:   MMT  eval 07/29/2022 08/12/2022  Vaginal 3/5 3/5   Internal Anal Sphincter     3/5  External Anal Sphincter     3/5  Puborectalis     3/5  (Blank rows = not tested)         TONE: increased   PROLAPSE: none   TODAY'S TREATMENT  08/12/2022 Manual: Internal pelvic floor techniques:No emotional/communication barriers or cognitive limitation. Patient is motivated to learn. Patient understands and agrees with treatment goals and plan. PT explains patient will be examined in standing, sitting, and lying down to see how their muscles and joints work. When they are ready, they will be asked to remove their underwear so PT can examine their perineum. The patient is also given the option of providing their own chaperone as one is not provided in our facility. The patient also has the right and is explained the right to defer or refuse any part of the evaluation or treatment including the internal exam. With the patient's consent, PT will use one gloved finger to gently assess the muscles of the pelvic floor, seeing how well it contracts and relaxes and if there is muscle symmetry. After, the patient will get dressed and PT and patient will discuss exam findings and plan of care. PT and patient discuss plan of care, schedule, attendance policy and HEP activities.  Going through the anus working on the puborectalis, along the anal sphincters, anterior wall of the rectum and anococcygeal ligament Neuromuscular re-education: Pelvic floor contraction training: With tactile cues to the sphincter muscles working on contraction holding for 40 seconds Therapist finger in the anal canal working on breathing for 9 sec holding for 5 then breath out for 7 sec to bring the air to the pelvic floor and generate enough force to push the therapist finger out of the canal.    Exercises: Strengthening: Nustep level 4 for 5 minutes while assessing patient Therapeutic activities: Reviewed on sitting on the commode, pumping  the stool out, keeping up with the diaphragmatic breathing    08/05/2022 Manual: Soft tissue mobilization:  Manual work to the left hip flexor and quadriceps after she had a cramp.  Scar tissue mobilization:  Manual work to the lower part of the scar Myofascial release: Fascial release on the lower quadrant in right sidely to work on the restrictions Fascial release of the sides of the scar to improve tissue mobility Neuromuscular re-education: Core facilitation: Supine core engagement with hip flexion but had difficulty with lifting left hip due to cramp Exercises: Stretches/mobility: Open book stretch in right sidely 15 times Supine left hip flexor stretch with left leg off the mat and therapist performs manual work to the hip flexor by the groin Sit on mat and stretch the left hip flexor Strengthening: Nustep level 4 for 5 minutes while assessing patient    07/29/2022 Manual: Soft tissue mobilization: Circular motion to the abdomen to work on the peristalic motion of the intestines Scar tissue mobilization: to the lower abdominal scar  with tissue mobilization and educated patient on how to perform the scar massage at home.  Myofascial release: release of the mesenteric root release around the diaphragm, release of the fascia working through the restrictions of the abdomen Release along the pubic rami, along the urogenital diaphragm, superior perineal and ischiocavernosus Neuromuscular re-education: Pelvic floor contraction training: Pelvic floor contraction with coughing Pelvic floor contraction in supine with tactile cues Exercises: Strengthening: Nustep level 4 for 5 minutes while assessing patient     PATIENT EDUCATION:  07/20/2022 Education details: Access Code: [place]; educated patient on scar massage, abdominal massage, and toileting Person educated: Patient Education method: Explanation, Demonstration, Tactile cues, Verbal cues, and Handouts Education  comprehension: verbalized understanding, returned demonstration, verbal cues required, tactile cues required, and needs further education     HOME EXERCISE PROGRAM: 07/20/2022 Access Code: [email or web address] URL: [email or web address] Date: 06/10/2022 Prepared by: Earlie Counts   Exercises - Seated Pelvic Floor Contraction  - 3 x daily - 7 x weekly - 1 sets - 10 reps - 5 sec hold   ASSESSMENT:   CLINICAL IMPRESSION: Patient is a 66 y.o. female who was seen today for physical therapy  treatment for fecal incontinence.  Patient reports difficulty pushing her stool our when it is in the rectum. Patient initial anal strength was 2/5 but after the manual work it was 3/5 holding for 40 seconds. Patient was able to breath and bring the air to her pelvic floor to relax it after several trials and verbal cues. She needs time to breath out to generate enough force to push the therapist finger out of the anal canal.   Patient would benefit from skilled therapy to improve pelvic floor coordination and strength to reduce urinary and fecal leakage while reducing pain.      OBJECTIVE IMPAIRMENTS decreased activity tolerance, decreased coordination, decreased endurance, decreased mobility, decreased strength, increased fascial restrictions, and pain.    ACTIVITY LIMITATIONS lifting, sitting, continence, and toileting   PARTICIPATION LIMITATIONS: interpersonal relationship, community activity, and occupation   PERSONAL FACTORS Age, Fitness, and 1-2 comorbidities: Abdominal hysterectomy 07/15/04; C-section 1978; Carcinoma fallopian tube 07/15/04; peripheral neuropathy  are also affecting patient's functional outcome.    REHAB POTENTIAL: Excellent   CLINICAL DECISION MAKING: Evolving/moderate complexity   EVALUATION COMPLEXITY: Moderate     GOALS: Goals reviewed with patient? Yes   SHORT TERM GOALS: Target date: 07/08/2022   Patient independent with scar massage.  Baseline: Goal status: Met  07/20/2022   2.  Patient is able to contract her abdominals with reduction of her rib cage angle.  Baseline:  Goal status: Met 07/29/2022   3.  Patient educated on vaginal moisturizers and lubricants to improve vaginal health.  Baseline:  Goal status: Met 07/29/2022   4.  Patient educated on urge to void  Baseline:  Goal status: INITIAL     LONG TERM GOALS: Target date: 09/02/2022    Patient independent with her HEP for pelvic floor and core strength to reduce leakage and pain.  Baseline:  Goal status: INITIAL   2.  Patient reports she is able to walk to the bathroom and not leak stool due to the ability to contract the anus and increased endurance.  Baseline:  Goal status: INITIAL   3.  Patient able to walk to the bathroom without leaking urine due to improved pelvic floor coordination and holding a contraction >/= 10 sec.  Baseline:  Goal status: Met 08/05/2022   4.  Patient reports she is able to pass  gas without leaking stool due to improved pelvic floor strength.  Baseline:  Goal status: Met 08/05/2022   5.  Patient reports her pain from the back to the abdomen decreased </= 2/10 due to improved lumbar mobility and reduction of restrictions of the lower abdomen.  Baseline:  Goal status: INITIAL   PLAN: PT FREQUENCY: 1x/week   PT DURATION: 12 weeks   PLANNED INTERVENTIONS: Therapeutic exercises, Therapeutic activity, Neuromuscular re-education, Patient/Family education, Self Care, Joint mobilization, Dry Needling, Electrical stimulation, Spinal mobilization, Cryotherapy, Moist heat, scar mobilization, Taping, Biofeedback, and Manual therapy   PLAN FOR NEXT SESSION: manual work to the abdomen on the left; left hip flexor, abdominal contraction, dry needling to left quads, see about getting the stool out, work on diaphragm to assist it coming downward to relax the pelvic floor   Earlie Counts, PT 08/12/22 2:47 PM

## 2022-08-19 ENCOUNTER — Encounter: Payer: Self-pay | Admitting: Physical Therapy

## 2022-08-19 ENCOUNTER — Ambulatory Visit: Payer: 59 | Attending: Family Medicine | Admitting: Physical Therapy

## 2022-08-19 DIAGNOSIS — R278 Other lack of coordination: Secondary | ICD-10-CM | POA: Diagnosis present

## 2022-08-19 DIAGNOSIS — M6281 Muscle weakness (generalized): Secondary | ICD-10-CM | POA: Diagnosis present

## 2022-08-19 NOTE — Therapy (Signed)
OUTPATIENT PHYSICAL THERAPY TREATMENT NOTE   Patient Name: Gail Crawford MRN: [id number] DOB:05-Feb-1956, 66 y.o., female Today's Date: 08/19/2022  PCP: Chesley Noon, MD  REFERRING PROVIDER: Briscoe Deutscher, DO    END OF SESSION:   PT End of Session - 08/19/22 1404     Visit Number 6    Date for PT Re-Evaluation 11/26/22    Authorization Type [employer]    PT Start Time 1400    PT Stop Time 0093    PT Time Calculation (min) 44 min    Activity Tolerance Patient tolerated treatment well    Behavior During Therapy [facility] for tasks assessed/performed             Past Medical History:  Diagnosis Date   Asthma 01-2004   Back pain    Carcinoma of fallopian tube, 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   Fallopian tube cancer, 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    HTN (hypertension)    Joint pain    Leg edema    Peripheral neuropathy 01/15/2012   Past Surgical History:  Procedure Laterality Date   ABDOMINAL HYSTERECTOMY  07-15-04   TYPE II RADICAL HYSTERECTOMY WITH BSO   CESAREAN SECTION  1978   MASS EXCISION Left 06/11/2015   Procedure: EXCISION LEFT ABDOMINAL WALL MASS 6X3CM;  Surgeon: Stark Klein, MD;  Location: [place];  Service: General;  Laterality: Left;   ORTHOPEDIC SURGERY     RIGHT SHOULDER AND ELBOW AND RIGHT FOOT   Patient Active Problem List   Diagnosis Date Noted   Metabolic syndrome 81/82/9937   Primary osteoarthritis of right knee 01/30/2020   Myalgia due to statin 12/13/2019   Gastroesophageal reflux disease without esophagitis 08/30/2019   Hyperlipidemia associated with type 2 diabetes mellitus ([place]), goal < 70, with statin-induced myalgias, [facility] Clinic has been offered  06/14/2019   Vitamin D deficiency 10/24/2018   Type 2 diabetes mellitus ([place]), Rx Trulicity 16/96/7893   Hypertension associated with diabetes ([place]), Rx Losartan and Chlorthalidone 10/06/2018   Hypertensive cardiomegaly 09/12/2018   REFERRING DIAG: R15.9 (ICD-10-CM) -  Fecal incontinence   THERAPY DIAG:  Muscle weakness (generalized)   Other lack of coordination   Rationale for Evaluation and Treatment Rehabilitation   ONSET DATE: 2021   SUBJECTIVE:                                                                                                                                                                               SUBJECTIVE STATEMENT: I felt different after last visit. I am not leaking urine now. Still has some stool leakage with gas. The stool leakage is 60% better.     PAIN:  Are  you having pain? Yes NPRS scale: 5/10 Pain location:  back to the lower abdomen   Pain type: pressure Pain description: intermittent    Aggravating factors: sitting Relieving factors: getting up to walk   PRECAUTIONS: Other: cancer   WEIGHT BEARING RESTRICTIONS No   FALLS:  Has patient fallen in last 6 months? No   LIVING ENVIRONMENT: Lives with: lives with their family   OCCUPATION: work in [employer]   PLOF: [place] reduce leakage and pain, hold her urine better.    PERTINENT HISTORY:  Abdominal hysterectomy 07/15/04; C-section 1978; Carcinoma fallopian tube 07/15/04; peripheral neuropathy   BOWEL MOVEMENT Pain with bowel movement: Yes, after bowel movement there is burning Type of bowel movement:Type (Bristol Stool Scale) Type 4 and type 6 at the end, Frequency 1-3 days, and Strain Yes sometimes Fully empty rectum: No Leakage: Yes: with gas, if she does not get to the bathroom quickly Pads: No, keeps one in pocketbook, in locker at work and in car Fiber supplement: No   URINATION Pain with urination: No Fully empty bladder: No Stream: Strong and strong at night and during the day is average  Urgency: Yes:   Frequency: night time void every 2 hours; daytime every 3 hours Leakage: Walking to the bathroom, Coughing, Sneezing, Laughing, and Lifting   INTERCOURSE Pain with intercourse:  gets urge to go to the  bathroom, pain with deeper penetration Ability to have vaginal penetration:  Yes:   [place]ax: sometimes Marinoff Scale: 1/3   PREGNANCY Vaginal deliveries 1 C-section deliveries 1 Currently pregnant No        OBJECTIVE: (objective measures completed at initial evaluation unless otherwise dated)     DIAGNOSTIC FINDINGS:  none   COGNITION:            Overall cognitive status: Within functional limits for tasks assessed                          SENSATION:            Light touch: Appears intact            Proprioception: Appears intact     LUMBAR SPECIAL TESTS:  none   FUNCTIONAL TESTS:  none                    POSTURE: No Significant postural limitations               PELVIC ALIGNMENT:   LUMBARAROM/PROM   A/PROM A/PROM  eval 08/19/22  Flexion full full  Extension Decreased by 25% Decreased by 25%  Right lateral flexion Decreased by 25% Decreased by 25%  Left lateral flexion Decreased by 25% Decreased by 25%  Right rotation Decreased by 25% Decreased by 25%  Left rotation Decreased by 25% Decreased by 25%   (Blank rows = not tested)   LOWER EXTREMITY ROM:   Passive ROM Right eval Left eval  Hip internal rotation 5 5  Hip external rotation   40   (Blank rows = not tested)   LOWER EXTREMITY MMT:   MMT Right eval right 08/19/22  Hip extension 4/5 4/5   Hip abduction 4/5 4/5      PALPATION:   General  abdominal scar has decreased movement; tenderness located in the left lower quadrant. Rib angle greater than 90, left gluteus medius, decreased movement of L1-L5, left gluteus medius, left quadratus  External Perineal Exam intact                             Internal Pelvic Floor tenderness along the left obturator internist, right iliococcygeus; decreased mobility of the posterior vaginal canal and puborectalis       PELVIC MMT:   MMT eval 07/29/2022 08/12/2022  Vaginal 3/5 3/5    Internal Anal Sphincter     3/5  External Anal  Sphincter     3/5  Puborectalis     3/5  (Blank rows = not tested)         TONE: increased   PROLAPSE: none   TODAY'S TREATMENT  08/19/22 Manual: Soft tissue mobilization: Manual work to the lower quadrant in sidely to release the tissue on the left  Open book exercise while the therapist assist the movement of the lower rib cage and lengthen the diaphragm Myofascial release: Using the suction cup to the lateral abdominal wall to release the tissue Neuromuscular re-education: Urge to void stool technique to reduce the urge to get to the bathroom in time and not leak stool.  Exercises: Strengthening: Nustep level 5 for 5 minutes while therapist assessed patient.     08/12/2022 Manual: Internal pelvic floor techniques:No emotional/communication barriers or cognitive limitation. Patient is motivated to learn. Patient understands and agrees with treatment goals and plan. PT explains patient will be examined in standing, sitting, and lying down to see how their muscles and joints work. When they are ready, they will be asked to remove their underwear so PT can examine their perineum. The patient is also given the option of providing their own chaperone as one is not provided in our facility. The patient also has the right and is explained the right to defer or refuse any part of the evaluation or treatment including the internal exam. With the patient's consent, PT will use one gloved finger to gently assess the muscles of the pelvic floor, seeing how well it contracts and relaxes and if there is muscle symmetry. After, the patient will get dressed and PT and patient will discuss exam findings and plan of care. PT and patient discuss plan of care, schedule, attendance policy and HEP activities.  Going through the anus working on the puborectalis, along the anal sphincters, anterior wall of the rectum and anococcygeal ligament Neuromuscular re-education: Pelvic floor contraction  training: With tactile cues to the sphincter muscles working on contraction holding for 40 seconds Therapist finger in the anal canal working on breathing for 9 sec holding for 5 then breath out for 7 sec to bring the air to the pelvic floor and generate enough force to push the therapist finger out of the canal.               Exercises: Strengthening: Nustep level 4 for 5 minutes while assessing patient Therapeutic activities: Reviewed on sitting on the commode, pumping the stool out, keeping up with the diaphragmatic breathing    08/05/2022 Manual: Soft tissue mobilization:  Manual work to the left hip flexor and quadriceps after she had a cramp.  Scar tissue mobilization:  Manual work to the lower part of the scar Myofascial release: Fascial release on the lower quadrant in right sidely to work on the restrictions Fascial release of the sides of the scar to improve tissue mobility Neuromuscular re-education: Core facilitation: Supine core engagement with hip flexion but had difficulty with lifting left hip due to cramp  Exercises: Stretches/mobility: Open book stretch in right sidely 15 times Supine left hip flexor stretch with left leg off the mat and therapist performs manual work to the hip flexor by the groin Sit on mat and stretch the left hip flexor Strengthening: Nustep level 4 for 5 minutes while assessing patient   PATIENT EDUCATION:  07/20/2022 Education details: Access Code: Fellows; educated patient on scar massage, abdominal massage, and toileting Person educated: Patient Education method: Explanation, Demonstration, Tactile cues, Verbal cues, and Handouts Education comprehension: verbalized understanding, returned demonstration, verbal cues required, tactile cues required, and needs further education     HOME EXERCISE PROGRAM: 07/20/2022 Access Code: [email or web address] URL: [email or web address] Date: 06/10/2022 Prepared by: Earlie Counts   Exercises -  Seated Pelvic Floor Contraction  - 3 x daily - 7 x weekly - 1 sets - 10 reps - 5 sec hold   ASSESSMENT:   CLINICAL IMPRESSION: Patient is a 66 y.o. female who was seen today for physical therapy  treatment for fecal incontinence.  Patient reports difficulty pushing her stool our when it is in the rectum. Patient initial anal strength was 2/5 but after the manual work it was 3/5 holding for 40 seconds. Patient was able to breath and bring the air to her pelvic floor to relax it after several trials and verbal cues. She needs time to breath out to generate enough force to push the therapist finger out of the anal canal.   Patient reports her stool leakage with gas was better by 60%. No straining with bowel movements this week. Her diaphragm is moving better. She continue to have restrictions in the abdomen and diaphragm. Patient would benefit from skilled therapy to improve pelvic floor coordination and strength to reduce urinary and fecal leakage while reducing pain.      OBJECTIVE IMPAIRMENTS decreased activity tolerance, decreased coordination, decreased endurance, decreased mobility, decreased strength, increased fascial restrictions, and pain.    ACTIVITY LIMITATIONS lifting, sitting, continence, and toileting   PARTICIPATION LIMITATIONS: interpersonal relationship, community activity, and occupation   PERSONAL FACTORS Age, Fitness, and 1-2 comorbidities: Abdominal hysterectomy 07/15/04; C-section 1978; Carcinoma fallopian tube 07/15/04; peripheral neuropathy  are also affecting patient's functional outcome.    REHAB POTENTIAL: Excellent   CLINICAL DECISION MAKING: Evolving/moderate complexity   EVALUATION COMPLEXITY: Moderate     GOALS: Goals reviewed with patient? Yes   SHORT TERM GOALS: Target date: 07/08/2022   Patient independent with scar massage.  Baseline: Goal status: Met 07/20/2022   2.  Patient is able to contract her abdominals with reduction of her rib cage angle.   Baseline:  Goal status: Met 07/29/2022   3.  Patient educated on vaginal moisturizers and lubricants to improve vaginal health.  Baseline:  Goal status: Met 07/29/2022   4.  Patient educated on urge to void  Baseline:  Goal status: Met 08/19/22     LONG TERM GOALS: Target date: 11/11/2022    Patient independent with her HEP for pelvic floor and core strength to reduce leakage and pain.  Baseline:  Goal status: ongoing 08/19/22   2.  Patient reports she is able to walk to the bathroom and not leak stool due to the ability to contract the anus and increased endurance.  Baseline:  Goal status: ongoing 08/19/22   3.  Patient able to walk to the bathroom without leaking urine due to improved pelvic floor coordination and holding a contraction >/= 10 sec.  Baseline:  Goal status: Met 08/05/2022   4.  Patient reports  she is able to pass gas without leaking stool due to improved pelvic floor strength.  Baseline:  Goal status: ongoing 08/19/22   5.  Patient reports her pain from the back to the abdomen decreased </= 2/10 due to improved lumbar mobility and reduction of restrictions of the lower abdomen.  Baseline:  Goal status: ongoing 08/19/22   PLAN: PT FREQUENCY: 1x/week   PT DURATION: 12 weeks   PLANNED INTERVENTIONS: Therapeutic exercises, Therapeutic activity, Neuromuscular re-education, Patient/Family education, Self Care, Joint mobilization, Dry Needling, Electrical stimulation, Spinal mobilization, Cryotherapy, Moist heat, scar mobilization, Taping, Biofeedback, and Manual therapy   PLAN FOR NEXT SESSION: manual work to the abdomen on the left; left hip flexor, abdominal contraction,  see about getting the stool out, work on diaphragm to assist it coming downward to relax the pelvic floor  Earlie Counts, PT 08/19/22 5:04 PM

## 2022-08-19 NOTE — Patient Instructions (Addendum)
Urge fecal Incontinence    Urge Drill  When you feel an urge to go, follow these steps to regain control: Stop what you are doing and be still Take one deep breath, directing your air into your abdomen Think an affirming thought, such as "I've got this." Do 5 quick anal flicks of your pelvic floor Walk with control to the bathroom to void, or delay voiding Initially delay for 1 minute then work towards 10 min If the urge leaves then go about your business.  If urge comes back then contract 5 times and walk to commode to have a bowel movement.   [facility] [address], [place],  [address] Phone # [phone] Fax [phone]

## 2022-09-03 ENCOUNTER — Encounter: Payer: 59 | Attending: Family Medicine | Admitting: Physical Therapy

## 2022-09-03 ENCOUNTER — Encounter: Payer: Self-pay | Admitting: Physical Therapy

## 2022-09-03 DIAGNOSIS — R278 Other lack of coordination: Secondary | ICD-10-CM

## 2022-09-03 DIAGNOSIS — M6281 Muscle weakness (generalized): Secondary | ICD-10-CM | POA: Diagnosis not present

## 2022-09-03 DIAGNOSIS — R159 Full incontinence of feces: Secondary | ICD-10-CM | POA: Diagnosis not present

## 2022-09-03 NOTE — Therapy (Signed)
OUTPATIENT PHYSICAL THERAPY TREATMENT NOTE   Patient Name: Gail Crawford MRN: [id number] DOB:Apr 28, 1956, 67 y.o., female Today's Date: 09/03/2022  PCP: Chesley Noon, MD   REFERRING PROVIDER: Briscoe Deutscher, DO   END OF SESSION:   PT End of Session - 09/03/22 1403     Visit Number 7    Date for PT Re-Evaluation 11/26/22    Authorization Type UHC    PT Start Time 1400    PT Stop Time 5726    PT Time Calculation (min) 45 min    Activity Tolerance Patient tolerated treatment well    Behavior During Therapy [facility] for tasks assessed/performed             Past Medical History:  Diagnosis Date   Asthma 01-2004   Back pain    Carcinoma of fallopian tube, 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   Fallopian tube cancer, 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    HTN (hypertension)    Joint pain    Leg edema    Peripheral neuropathy 01/15/2012   Past Surgical History:  Procedure Laterality Date   ABDOMINAL HYSTERECTOMY  07-15-04   TYPE II RADICAL HYSTERECTOMY WITH BSO   CESAREAN SECTION  1978   MASS EXCISION Left 06/11/2015   Procedure: EXCISION LEFT ABDOMINAL WALL MASS 6X3CM;  Surgeon: Stark Klein, MD;  Location: [place];  Service: General;  Laterality: Left;   ORTHOPEDIC SURGERY     RIGHT SHOULDER AND ELBOW AND RIGHT FOOT   Patient Active Problem List   Diagnosis Date Noted   Metabolic syndrome 20/35/5974   Primary osteoarthritis of right knee 01/30/2020   Myalgia due to statin 12/13/2019   Gastroesophageal reflux disease without esophagitis 08/30/2019   Hyperlipidemia associated with type 2 diabetes mellitus ([place]), goal < 70, with statin-induced myalgias, Lipid Clinic has been offered  06/14/2019   Vitamin D deficiency 10/24/2018   Type 2 diabetes mellitus ([place]), Rx Trulicity 16/38/4536   Hypertension associated with diabetes ([place]), Rx Losartan and Chlorthalidone 10/06/2018   Hypertensive cardiomegaly 09/12/2018   REFERRING DIAG: R15.9 (ICD-10-CM) -  Fecal incontinence   THERAPY DIAG:  Muscle weakness (generalized)   Other lack of coordination   Rationale for Evaluation and Treatment Rehabilitation   ONSET DATE: 2021   SUBJECTIVE:                                                                                                                                                                               SUBJECTIVE STATEMENT: I am not having leakage. I am having trouble holding my bowels for 10 minutes due to getting pain in left back and lower abdomen. After having a bowel movement the pain  resolved. I can walk to the bathroom and not leak.     PAIN:  Are you having pain? Yes NPRS scale: 5-10/10 Pain location:  back to the lower abdomen   Pain type: pressure Pain description: intermittent    Aggravating factors: sitting Relieving factors: getting up to walk   PRECAUTIONS: Other: cancer   WEIGHT BEARING RESTRICTIONS No   FALLS:  Has patient fallen in last 6 months? No   LIVING ENVIRONMENT: Lives with: lives with their family   OCCUPATION: work in [employer]   PLOF: [place] reduce leakage and pain, hold her urine better.    PERTINENT HISTORY:  Abdominal hysterectomy 07/15/04; C-section 1978; Carcinoma fallopian tube 07/15/04; peripheral neuropathy   BOWEL MOVEMENT Pain with bowel movement: Yes, after bowel movement there is burning Type of bowel movement:Type (Bristol Stool Scale) Type 4 and type 6 at the end, Frequency 1-3 days, and Strain Yes sometimes Fully empty rectum: No Leakage: Yes: with gas, if she does not get to the bathroom quickly Pads: No, keeps one in pocketbook, in locker at work and in car Fiber supplement: No   URINATION Pain with urination: No Fully empty bladder: No Stream: Strong and strong at night and during the day is average  Urgency: Yes:   Frequency: night time void every 2 hours; daytime every 3 hours Leakage: Walking to the bathroom, Coughing, Sneezing,  Laughing, and Lifting   INTERCOURSE Pain with intercourse:  gets urge to go to the bathroom, pain with deeper penetration Ability to have vaginal penetration:  Yes:   Climax: sometimes Marinoff Scale: 1/3   PREGNANCY Vaginal deliveries 1 C-section deliveries 1 Currently pregnant No        OBJECTIVE: (objective measures completed at initial evaluation unless otherwise dated)     DIAGNOSTIC FINDINGS:  none   COGNITION:            Overall cognitive status: Within functional limits for tasks assessed                          SENSATION:            Light touch: Appears intact            Proprioception: Appears intact     LUMBAR SPECIAL TESTS:  none   FUNCTIONAL TESTS:  none                    POSTURE: No Significant postural limitations               PELVIC ALIGNMENT:   LUMBARAROM/PROM   A/PROM A/PROM  eval 08/19/22  Flexion full full  Extension Decreased by 25% Decreased by 25%  Right lateral flexion Decreased by 25% Decreased by 25%  Left lateral flexion Decreased by 25% Decreased by 25%  Right rotation Decreased by 25% Decreased by 25%  Left rotation Decreased by 25% Decreased by 25%   (Blank rows = not tested)   LOWER EXTREMITY ROM:   Passive ROM Right eval Left eval  Hip internal rotation 5 5  Hip external rotation   40   (Blank rows = not tested)   LOWER EXTREMITY MMT:   MMT Right eval right 08/19/22  Hip extension 4/5 4/5   Hip abduction 4/5 4/5      PALPATION:   General  abdominal scar has decreased movement; tenderness located in the left lower quadrant. Rib angle greater than 90, left gluteus medius,  decreased movement of L1-L5, left gluteus medius, left quadratus                 External Perineal Exam intact                             Internal Pelvic Floor tenderness along the left obturator internist, right iliococcygeus; decreased mobility of the posterior vaginal canal and puborectalis       PELVIC MMT:   MMT eval 07/29/2022  08/12/2022  Vaginal 3/5 3/5    Internal Anal Sphincter     3/5  External Anal Sphincter     3/5  Puborectalis     3/5  (Blank rows = not tested)         TONE: increased   PROLAPSE: none   TODAY'S TREATMENT  09/03/2022 Manual: Soft tissue mobilization: Right sidely working on the left abdominal wall to move the intestines and mesenteric root off the posterior abdominal wall.  Quadruped pulling the abdominal tissue forward off the posterior abdominal wall in static position, then moving hips forward and backward then side to side Manual work to the left quadratus Neuromuscular re-education: Down training: Diaphragmatic breathing to get the air in the left lower lungs and diaphragm Exercises: Stretches/mobility: Lay on right side doing open book and therapist performing manual work along the diaphragm and lower abdominal area.     08/19/22 Manual: Soft tissue mobilization: Manual work to the lower quadrant in sidely to release the tissue on the left  Open book exercise while the therapist assist the movement of the lower rib cage and lengthen the diaphragm Myofascial release: Using the suction cup to the lateral abdominal wall to release the tissue Neuromuscular re-education: Urge to void stool technique to reduce the urge to get to the bathroom in time and not leak stool.  Exercises: Strengthening: Nustep level 5 for 5 minutes while therapist assessed patient.     08/12/2022 Manual: Internal pelvic floor techniques:No emotional/communication barriers or cognitive limitation. Patient is motivated to learn. Patient understands and agrees with treatment goals and plan. PT explains patient will be examined in standing, sitting, and lying down to see how their muscles and joints work. When they are ready, they will be asked to remove their underwear so PT can examine their perineum. The patient is also given the option of providing their own chaperone as one is not provided in our  facility. The patient also has the right and is explained the right to defer or refuse any part of the evaluation or treatment including the internal exam. With the patient's consent, PT will use one gloved finger to gently assess the muscles of the pelvic floor, seeing how well it contracts and relaxes and if there is muscle symmetry. After, the patient will get dressed and PT and patient will discuss exam findings and plan of care. PT and patient discuss plan of care, schedule, attendance policy and HEP activities.  Going through the anus working on the puborectalis, along the anal sphincters, anterior wall of the rectum and anococcygeal ligament Neuromuscular re-education: Pelvic floor contraction training: With tactile cues to the sphincter muscles working on contraction holding for 40 seconds Therapist finger in the anal canal working on breathing for 9 sec holding for 5 then breath out for 7 sec to bring the air to the pelvic floor and generate enough force to push the therapist finger out of the canal.  Exercises: Strengthening: Nustep level 4 for 5 minutes while assessing patient Therapeutic activities: Reviewed on sitting on the commode, pumping the stool out, keeping up with the diaphragmatic breathing    PATIENT EDUCATION:  07/20/2022 Education details: Access Code: [place]; educated patient on scar massage, abdominal massage, and toileting Person educated: Patient Education method: Explanation, Demonstration, Tactile cues, Verbal cues, and Handouts Education comprehension: verbalized understanding, returned demonstration, verbal cues required, tactile cues required, and needs further education     HOME EXERCISE PROGRAM: 07/20/2022 Access Code: [email or web address] URL: [email or web address] Date: 06/10/2022 Prepared by: Earlie Counts   Exercises - Seated Pelvic Floor Contraction  - 3 x daily - 7 x weekly - 1 sets - 10 reps - 5 sec hold   ASSESSMENT:    CLINICAL IMPRESSION: Patient is a 67 y.o. female who was seen today for physical therapy  treatment for fecal incontinence.  Patient is not having the leakage. I am not straining to have a bowel movement. Patient will get a pain in the left back and lower abdomen when she has to have a bowel movement.  Patient has tightness in the left diaphragm and along the intestines and internal structures that may cause pain when she needs to have a bowel movement. Manual work reduced the restrictions to see if that helps the intestinal pain. Patient would benefit from skilled therapy to improve pelvic floor coordination and strength to reduce urinary and fecal leakage while reducing pain.      OBJECTIVE IMPAIRMENTS decreased activity tolerance, decreased coordination, decreased endurance, decreased mobility, decreased strength, increased fascial restrictions, and pain.    ACTIVITY LIMITATIONS lifting, sitting, continence, and toileting   PARTICIPATION LIMITATIONS: interpersonal relationship, community activity, and occupation   PERSONAL FACTORS Age, Fitness, and 1-2 comorbidities: Abdominal hysterectomy 07/15/04; C-section 1978; Carcinoma fallopian tube 07/15/04; peripheral neuropathy  are also affecting patient's functional outcome.    REHAB POTENTIAL: Excellent   CLINICAL DECISION MAKING: Evolving/moderate complexity   EVALUATION COMPLEXITY: Moderate     GOALS: Goals reviewed with patient? Yes   SHORT TERM GOALS: Target date: 07/08/2022   Patient independent with scar massage.  Baseline: Goal status: Met 07/20/2022   2.  Patient is able to contract her abdominals with reduction of her rib cage angle.  Baseline:  Goal status: Met 07/29/2022   3.  Patient educated on vaginal moisturizers and lubricants to improve vaginal health.  Baseline:  Goal status: Met 07/29/2022   4.  Patient educated on urge to void  Baseline:  Goal status: Met 08/19/22     LONG TERM GOALS: Target date:  11/11/2022    Patient independent with her HEP for pelvic floor and core strength to reduce leakage and pain.  Baseline:  Goal status: ongoing 08/19/22   2.  Patient reports she is able to walk to the bathroom and not leak stool due to the ability to contract the anus and increased endurance.  Baseline:  Goal status: ongoing 08/19/22   3.  Patient able to walk to the bathroom without leaking urine due to improved pelvic floor coordination and holding a contraction >/= 10 sec.  Baseline:  Goal status: Met 08/05/2022   4.  Patient reports she is able to pass gas without leaking stool due to improved pelvic floor strength.  Baseline:  Goal status: ongoing 08/19/22   5.  Patient reports her pain from the back to the abdomen decreased </= 2/10 due to improved lumbar mobility and reduction of restrictions of the lower abdomen.  Baseline:  Goal status: ongoing 08/19/22   PLAN: PT FREQUENCY: 1x/week   PT DURATION: 12 weeks   PLANNED INTERVENTIONS: Therapeutic exercises, Therapeutic activity, Neuromuscular re-education, Patient/Family education, Self Care, Joint mobilization, Dry Needling, Electrical stimulation, Spinal mobilization, Cryotherapy, Moist heat, scar mobilization, Taping, Biofeedback, and Manual therapy   PLAN FOR NEXT SESSION: manual work to the abdomen on the left; left hip flexor, abdominal contraction   Earlie Counts, PT 09/03/22 2:59 PM

## 2022-09-10 ENCOUNTER — Encounter: Payer: Self-pay | Admitting: Physical Therapy

## 2022-09-10 ENCOUNTER — Encounter: Payer: 59 | Admitting: Physical Therapy

## 2022-09-10 DIAGNOSIS — R159 Full incontinence of feces: Secondary | ICD-10-CM | POA: Diagnosis not present

## 2022-09-10 DIAGNOSIS — M6281 Muscle weakness (generalized): Secondary | ICD-10-CM

## 2022-09-10 DIAGNOSIS — R278 Other lack of coordination: Secondary | ICD-10-CM

## 2022-09-10 NOTE — Therapy (Signed)
OUTPATIENT PHYSICAL THERAPY TREATMENT NOTE   Patient Name: Gail Crawford MRN: [id number] DOB:Aug 19, 1956, 67 y.o., female Today's Date: 09/10/2022  PCP: Chesley Noon, MD    REFERRING PROVIDER: Briscoe Deutscher, DO   END OF SESSION:   PT End of Session - 09/10/22 1403     Visit Number 8    Date for PT Re-Evaluation 11/26/22    Authorization Type UHC    PT Start Time 1400    PT Stop Time 2778    PT Time Calculation (min) 45 min    Activity Tolerance Patient tolerated treatment well    Behavior During Therapy [facility] for tasks assessed/performed             Past Medical History:  Diagnosis Date   Asthma 01-2004   Back pain    Carcinoma of fallopian tube, 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   Fallopian tube cancer, 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    HTN (hypertension)    Joint pain    Leg edema    Peripheral neuropathy 01/15/2012   Past Surgical History:  Procedure Laterality Date   ABDOMINAL HYSTERECTOMY  07-15-04   TYPE II RADICAL HYSTERECTOMY WITH BSO   CESAREAN SECTION  1978   MASS EXCISION Left 06/11/2015   Procedure: EXCISION LEFT ABDOMINAL WALL MASS 6X3CM;  Surgeon: Stark Klein, MD;  Location: [facility];  Service: General;  Laterality: Left;   ORTHOPEDIC SURGERY     RIGHT SHOULDER AND ELBOW AND RIGHT FOOT   Patient Active Problem List   Diagnosis Date Noted   Metabolic syndrome 24/23/5361   Primary osteoarthritis of right knee 01/30/2020   Myalgia due to statin 12/13/2019   Gastroesophageal reflux disease without esophagitis 08/30/2019   Hyperlipidemia associated with type 2 diabetes mellitus ([place]), goal < 70, with statin-induced myalgias, Lipid Clinic has been offered  06/14/2019   Vitamin D deficiency 10/24/2018   Type 2 diabetes mellitus ([place]), Rx Trulicity 44/31/5400   Hypertension associated with diabetes ([place]), Rx Losartan and Chlorthalidone 10/06/2018   Hypertensive cardiomegaly 09/12/2018   REFERRING DIAG: R15.9 (ICD-10-CM) -  Fecal incontinence   THERAPY DIAG:  Muscle weakness (generalized)   Other lack of coordination   Rationale for Evaluation and Treatment Rehabilitation   ONSET DATE: 2021   SUBJECTIVE:                                                                                                                                                                               SUBJECTIVE STATEMENT: I am doing better with having a bowel movement. I still have back pain with a bowel movement. No fecal or urinary leakage. Typically I will go to the bathroom in  the morning.     PAIN:  Are you having pain? Yes NPRS scale: 5-10/10 Pain location:  back to the lower abdomen   Pain type: pressure Pain description: intermittent    Aggravating factors: sitting Relieving factors: getting up to walk   PRECAUTIONS: Other: cancer   WEIGHT BEARING RESTRICTIONS No   FALLS:  Has patient fallen in last 6 months? No   LIVING ENVIRONMENT: Lives with: lives with their family   OCCUPATION: work in [employer]   PLOF: [place] reduce leakage and pain, hold her urine better.    PERTINENT HISTORY:  Abdominal hysterectomy 07/15/04; C-section 1978; Carcinoma fallopian tube 07/15/04; peripheral neuropathy   BOWEL MOVEMENT Pain with bowel movement: No buring Type of bowel movement:Type (Bristol Stool Scale) Type 4 and type 6 at the end, Frequency 1-3 days, and Strain Yes sometimes Fully empty rectum: No Leakage: No Pads: No, keeps one in pocketbook, in locker at work and in car Fiber supplement: No   URINATION Pain with urination: No Fully empty bladder: No Stream: Strong and strong at night and during the day is average  Urgency: No Frequency: night time void every 2 hours; daytime every 3 hours Leakage: None   INTERCOURSE Pain with intercourse:  gets urge to go to the bathroom, pain with deeper penetration Ability to have vaginal penetration:  Yes:   Climax: sometimes Marinoff  Scale: 1/3   PREGNANCY Vaginal deliveries 1 C-section deliveries 1 Currently pregnant No        OBJECTIVE: (objective measures completed at initial evaluation unless otherwise dated)     DIAGNOSTIC FINDINGS:  none   COGNITION:            Overall cognitive status: Within functional limits for tasks assessed                          SENSATION:            Light touch: Appears intact            Proprioception: Appears intact     LUMBAR SPECIAL TESTS:  none   FUNCTIONAL TESTS:  none                    POSTURE: No Significant postural limitations               PELVIC ALIGNMENT:   LUMBARAROM/PROM   A/PROM A/PROM  eval 08/19/22 09/10/22  Flexion full full full  Extension Decreased by 25% Decreased by 25% Decreased by 25%  Right lateral flexion Decreased by 25% Decreased by 25% Decreased by 25%  Left lateral flexion Decreased by 25% Decreased by 25% Decreased by 25%  Right rotation Decreased by 25% Decreased by 25% Decreased by 25%  Left rotation Decreased by 25% Decreased by 25% Decreased by 25%   (Blank rows = not tested)   LOWER EXTREMITY ROM:   Passive ROM Right eval Left eval Left 09/10/22  Hip internal rotation '5 5 20  '$ Hip external rotation   40 65   (Blank rows = not tested)   LOWER EXTREMITY MMT:   MMT Right eval right 08/19/22  Hip extension 4/5 4/5   Hip abduction 4/5 4/5      PALPATION:   General  abdominal scar has decreased movement; tenderness located in the left lower quadrant. Rib angle greater than 90, left gluteus medius, decreased movement of L1-L5, left gluteus medius, left quadratus  External Perineal Exam intact                             Internal Pelvic Floor tenderness along the left obturator internist, right iliococcygeus; decreased mobility of the posterior vaginal canal and puborectalis       PELVIC MMT:   MMT eval 07/29/2022 08/12/2022  Vaginal 3/5 3/5    Internal Anal Sphincter     3/5  External Anal  Sphincter     3/5  Puborectalis     3/5  (Blank rows = not tested)         TONE: increased   PROLAPSE: none   TODAY'S TREATMENT  09/10/22 Manual: Soft tissue mobilization: Circular massage of the abdomen to improve peristalic motion of the intestines Manual work to the diaphragm Myofascial release: Fascial release along the left abdominal area and lumbar in right sidely Fascial release along the lateral side of the left rectus Fascial release of the mesenteric root and inguinal area Exercises: Stretches/mobility: Strengthening: Squats 10x Standing hip external rotation 10x each side  09/03/2022 Manual: Soft tissue mobilization: Right sidely working on the left abdominal wall to move the intestines and mesenteric root off the posterior abdominal wall.  Quadruped pulling the abdominal tissue forward off the posterior abdominal wall in static position, then moving hips forward and backward then side to side Manual work to the left quadratus Neuromuscular re-education: Down training: Diaphragmatic breathing to get the air in the left lower lungs and diaphragm Exercises: Stretches/mobility: Lay on right side doing open book and therapist performing manual work along the diaphragm and lower abdominal area.      08/19/22 Manual: Soft tissue mobilization: Manual work to the lower quadrant in sidely to release the tissue on the left  Open book exercise while the therapist assist the movement of the lower rib cage and lengthen the diaphragm Myofascial release: Using the suction cup to the lateral abdominal wall to release the tissue Neuromuscular re-education: Urge to void stool technique to reduce the urge to get to the bathroom in time and not leak stool.  Exercises: Strengthening: Nustep level 5 for 5 minutes while therapist assessed patient.      PATIENT EDUCATION:  07/20/2022 Education details: Access Code: [place]; educated patient on scar massage, abdominal  massage, and toileting Person educated: Patient Education method: Explanation, Demonstration, Tactile cues, Verbal cues, and Handouts Education comprehension: verbalized understanding, returned demonstration, verbal cues required, tactile cues required, and needs further education     HOME EXERCISE PROGRAM: 07/20/2022 Access Code: [email or web address] URL: [email or web address] Date: 06/10/2022 Prepared by: Earlie Counts   Exercises - Seated Pelvic Floor Contraction  - 3 x daily - 7 x weekly - 1 sets - 10 reps - 5 sec hold   ASSESSMENT:   CLINICAL IMPRESSION: Patient is a 66 y.o. female who was seen today for physical therapy  treatment for fecal incontinence.  Patient is not having the leakage. She  am not straining to have a bowel movement. Patient will get a pain in the left back and lower abdomen when she has to have a bowel movement but not as intense.  She is able to pass gas without leakage. She has a bowel movement daily in the morning. Patient would benefit from skilled therapy to improve pelvic floor coordination and strength to reduce urinary and fecal leakage while reducing pain.      OBJECTIVE IMPAIRMENTS decreased activity tolerance, decreased coordination, decreased endurance, decreased mobility,  decreased strength, increased fascial restrictions, and pain.    ACTIVITY LIMITATIONS lifting, sitting, continence, and toileting   PARTICIPATION LIMITATIONS: interpersonal relationship, community activity, and occupation   PERSONAL FACTORS Age, Fitness, and 1-2 comorbidities: Abdominal hysterectomy 07/15/04; C-section 1978; Carcinoma fallopian tube 07/15/04; peripheral neuropathy  are also affecting patient's functional outcome.    REHAB POTENTIAL: Excellent   CLINICAL DECISION MAKING: Evolving/moderate complexity   EVALUATION COMPLEXITY: Moderate     GOALS: Goals reviewed with patient? Yes   SHORT TERM GOALS: Target date: 07/08/2022   Patient independent with scar  massage.  Baseline: Goal status: Met 07/20/2022   2.  Patient is able to contract her abdominals with reduction of her rib cage angle.  Baseline:  Goal status: Met 07/29/2022   3.  Patient educated on vaginal moisturizers and lubricants to improve vaginal health.  Baseline:  Goal status: Met 07/29/2022   4.  Patient educated on urge to void  Baseline:  Goal status: Met 08/19/22     LONG TERM GOALS: Target date: 11/11/2022    Patient independent with her HEP for pelvic floor and core strength to reduce leakage and pain.  Baseline:  Goal status: ongoing 08/19/22   2.  Patient reports she is able to walk to the bathroom and not leak stool due to the ability to contract the anus and increased endurance.  Baseline:  Goal status: ongoing 08/19/22   3.  Patient able to walk to the bathroom without leaking urine due to improved pelvic floor coordination and holding a contraction >/= 10 sec.  Baseline:  Goal status: Met 08/05/2022   4.  Patient reports she is able to pass gas without leaking stool due to improved pelvic floor strength.  Baseline:  Goal status: Met 09/10/22  5.  Patient reports her pain from the back to the abdomen decreased </= 2/10 due to improved lumbar mobility and reduction of restrictions of the lower abdomen.  Baseline:  Goal status: ongoing 08/19/22   PLAN: PT FREQUENCY: 1x/week   PT DURATION: 12 weeks   PLANNED INTERVENTIONS: Therapeutic exercises, Therapeutic activity, Neuromuscular re-education, Patient/Family education, Self Care, Joint mobilization, Dry Needling, Electrical stimulation, Spinal mobilization, Cryotherapy, Moist heat, scar mobilization, Taping, Biofeedback, and Manual therapy   PLAN FOR NEXT SESSION: discharge if doing well  Earlie Counts, PT 09/10/22 2:52 PM

## 2022-09-17 ENCOUNTER — Encounter: Payer: 59 | Admitting: Physical Therapy

## 2022-09-23 ENCOUNTER — Ambulatory Visit: Payer: 59 | Attending: Family Medicine | Admitting: Physical Therapy

## 2022-09-23 ENCOUNTER — Encounter: Payer: Self-pay | Admitting: Physical Therapy

## 2022-09-23 DIAGNOSIS — M6281 Muscle weakness (generalized): Secondary | ICD-10-CM

## 2022-09-23 DIAGNOSIS — R278 Other lack of coordination: Secondary | ICD-10-CM | POA: Diagnosis present

## 2022-09-23 NOTE — Therapy (Signed)
OUTPATIENT PHYSICAL THERAPY TREATMENT NOTE   Patient Name: Gail Crawford MRN: [id number] DOB:Jan 01, 1956, 67 y.o., female Today's Date: 09/23/2022  PCP: Chesley Noon, MD  REFERRING PROVIDER: Briscoe Deutscher, DO   END OF SESSION:   PT End of Session - 09/23/22 1448     Visit Number 9    Date for PT Re-Evaluation 11/26/22    Authorization Type UHC    PT Start Time 6629    PT Stop Time 4765    PT Time Calculation (min) 40 min    Activity Tolerance Patient tolerated treatment well    Behavior During Therapy [facility] for tasks assessed/performed             Past Medical History:  Diagnosis Date   Asthma 01-2004   Back pain    Carcinoma of fallopian tube, 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   Fallopian tube cancer, 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    HTN (hypertension)    Joint pain    Leg edema    Peripheral neuropathy 01/15/2012   Past Surgical History:  Procedure Laterality Date   ABDOMINAL HYSTERECTOMY  07-15-04   TYPE II RADICAL HYSTERECTOMY WITH BSO   CESAREAN SECTION  1978   MASS EXCISION Left 06/11/2015   Procedure: EXCISION LEFT ABDOMINAL WALL MASS 6X3CM;  Surgeon: Stark Klein, MD;  Location: [place];  Service: General;  Laterality: Left;   ORTHOPEDIC SURGERY     RIGHT SHOULDER AND ELBOW AND RIGHT FOOT   Patient Active Problem List   Diagnosis Date Noted   Metabolic syndrome 46/50/3546   Primary osteoarthritis of right knee 01/30/2020   Myalgia due to statin 12/13/2019   Gastroesophageal reflux disease without esophagitis 08/30/2019   Hyperlipidemia associated with type 2 diabetes mellitus ([place]), goal < 70, with statin-induced myalgias, Lipid Clinic has been offered  06/14/2019   Vitamin D deficiency 10/24/2018   Type 2 diabetes mellitus ([place]), Rx Trulicity 56/81/2751   Hypertension associated with diabetes ([place]), Rx Losartan and Chlorthalidone 10/06/2018   Hypertensive cardiomegaly 09/12/2018   REFERRING DIAG: R15.9 (ICD-10-CM) -  Fecal incontinence   THERAPY DIAG:  Muscle weakness (generalized)   Other lack of coordination   Rationale for Evaluation and Treatment Rehabilitation   ONSET DATE: 2021   SUBJECTIVE:                                                                                                                                                                               SUBJECTIVE STATEMENT: I had a bad stomach virus and had to get hydrated.  I am constipated due to having diarrhea.      PAIN:  Are you having pain? Yes NPRS scale:  04/09/09 Pain location:  back to the lower abdomen   Pain type: pressure Pain description: intermittent    Aggravating factors: sitting Relieving factors: getting up to walk   PRECAUTIONS: Other: cancer   WEIGHT BEARING RESTRICTIONS No   FALLS:  Has patient fallen in last 6 months? No   LIVING ENVIRONMENT: Lives with: lives with their family   OCCUPATION: work in [employer]   PLOF: [place] reduce leakage and pain, hold her urine better.    PERTINENT HISTORY:  Abdominal hysterectomy 07/15/04; C-section 1978; Carcinoma fallopian tube 07/15/04; peripheral neuropathy   BOWEL MOVEMENT Pain with bowel movement: No buring Type of bowel movement:Type (Bristol Stool Scale) Type 4 and type 6 at the end, Frequency 1-3 days, and Strain Yes sometimes Fully empty rectum: No Leakage: No Pads: No, keeps one in pocketbook, in locker at work and in car Fiber supplement: No   URINATION Pain with urination: No Fully empty bladder: No Stream: Strong and strong at night and during the day is average  Urgency: No Frequency: night time void every 2 hours; daytime every 3 hours Leakage: None   INTERCOURSE Pain with intercourse:  gets urge to go to the bathroom, pain with deeper penetration Ability to have vaginal penetration:  Yes:   Climax: sometimes Marinoff Scale: 1/3   PREGNANCY Vaginal deliveries 1 C-section deliveries 1 Currently  pregnant No        OBJECTIVE: (objective measures completed at initial evaluation unless otherwise dated)     DIAGNOSTIC FINDINGS:  none   COGNITION:            Overall cognitive status: Within functional limits for tasks assessed                          SENSATION:            Light touch: Appears intact            Proprioception: Appears intact     LUMBAR SPECIAL TESTS:  none   FUNCTIONAL TESTS:  none                    POSTURE: No Significant postural limitations               PELVIC ALIGNMENT:   LUMBARAROM/PROM   A/PROM A/PROM  eval 08/19/22 09/10/22  Flexion full full full  Extension Decreased by 25% Decreased by 25% Decreased by 25%  Right lateral flexion Decreased by 25% Decreased by 25% Decreased by 25%  Left lateral flexion Decreased by 25% Decreased by 25% Decreased by 25%  Right rotation Decreased by 25% Decreased by 25% Decreased by 25%  Left rotation Decreased by 25% Decreased by 25% Decreased by 25%   (Blank rows = not tested)   LOWER EXTREMITY ROM:   Passive ROM Right eval Left eval Left 09/10/22  Hip internal rotation '5 5 20  '$ Hip external rotation   40 65   (Blank rows = not tested)   LOWER EXTREMITY MMT:   MMT Right eval right 08/19/22  Hip extension 4/5 4/5   Hip abduction 4/5 4/5      PALPATION:   General  abdominal scar has decreased movement; tenderness located in the left lower quadrant. Rib angle greater than 90, left gluteus medius, decreased movement of L1-L5, left gluteus medius, left quadratus                 External Perineal Exam  intact                             Internal Pelvic Floor tenderness along the left obturator internist, right iliococcygeus; decreased mobility of the posterior vaginal canal and puborectalis       PELVIC MMT:   MMT eval 07/29/2022 08/12/2022  Vaginal 3/5 3/5    Internal Anal Sphincter     3/5  External Anal Sphincter     3/5  Puborectalis     3/5  (Blank rows = not tested)          TONE: increased   PROLAPSE: none   TODAY'S TREATMENT  09/23/22 Manual: Soft tissue mobilization: Circular massage to abdomen to promote peristalic motion of intestines Manual work to bilateral diaphragm Manual work to left quadratus and left obliques Myofascial release: Release of the mesenteric root  Stand and lean on high mat with therapist pulling the tissue from the sides to the abdomen Lifting tissue off the suprapubic area in tall kneel leaning on high mat and patient move hips side to side and forward and back Release of the upper abdominal area  Exercises: Stretches/mobility: Supine trunk rotation hold 30 sec bil.  Cat camel 10x leaning on mat in standing Childs pose in standing with arms on high mat Strengthening: Nustep 6 minutes level 5 while assessing patient.      09/10/22 Manual: Soft tissue mobilization: Circular massage of the abdomen to improve peristalic motion of the intestines Manual work to the diaphragm Myofascial release: Fascial release along the left abdominal area and lumbar in right sidely Fascial release along the lateral side of the left rectus Fascial release of the mesenteric root and inguinal area Exercises: Stretches/mobility: Strengthening: Squats 10x Standing hip external rotation 10x each side   09/03/2022 Manual: Soft tissue mobilization: Right sidely working on the left abdominal wall to move the intestines and mesenteric root off the posterior abdominal wall.  Quadruped pulling the abdominal tissue forward off the posterior abdominal wall in static position, then moving hips forward and backward then side to side Manual work to the left quadratus Neuromuscular re-education: Down training: Diaphragmatic breathing to get the air in the left lower lungs and diaphragm Exercises: Stretches/mobility: Lay on right side doing open book and therapist performing manual work along the diaphragm and lower abdominal area.         PATIENT EDUCATION:  07/20/2022 Education details: Access Code: [place]; educated patient on scar massage, abdominal massage, and toileting Person educated: Patient Education method: Explanation, Demonstration, Tactile cues, Verbal cues, and Handouts Education comprehension: verbalized understanding, returned demonstration, verbal cues required, tactile cues required, and needs further education     HOME EXERCISE PROGRAM: 07/20/2022 Access Code: [email or web address] URL: [email or web address] Date: 06/10/2022 Prepared by: Earlie Counts   Exercises - Seated Pelvic Floor Contraction  - 3 x daily - 7 x weekly - 1 sets - 10 reps - 5 sec hold   ASSESSMENT:   CLINICAL IMPRESSION: Patient is a 67 y.o. female who was seen today for physical therapy  treatment for fecal incontinence.  Patient is having trouble with constipation due to being sick. Patient had some restrictions in the abdomen. Patient is going to work on abdominal work to assist in moving stool through the intestines.  Patient would benefit from skilled therapy to improve pelvic floor coordination and strength to reduce urinary and fecal leakage while reducing pain.      OBJECTIVE IMPAIRMENTS decreased activity  tolerance, decreased coordination, decreased endurance, decreased mobility, decreased strength, increased fascial restrictions, and pain.    ACTIVITY LIMITATIONS lifting, sitting, continence, and toileting   PARTICIPATION LIMITATIONS: interpersonal relationship, community activity, and occupation   PERSONAL FACTORS Age, Fitness, and 1-2 comorbidities: Abdominal hysterectomy 07/15/04; C-section 1978; Carcinoma fallopian tube 07/15/04; peripheral neuropathy  are also affecting patient's functional outcome.    REHAB POTENTIAL: Excellent   CLINICAL DECISION MAKING: Evolving/moderate complexity   EVALUATION COMPLEXITY: Moderate     GOALS: Goals reviewed with patient? Yes   SHORT TERM GOALS: Target date: 07/08/2022    Patient independent with scar massage.  Baseline: Goal status: Met 07/20/2022   2.  Patient is able to contract her abdominals with reduction of her rib cage angle.  Baseline:  Goal status: Met 07/29/2022   3.  Patient educated on vaginal moisturizers and lubricants to improve vaginal health.  Baseline:  Goal status: Met 07/29/2022   4.  Patient educated on urge to void  Baseline:  Goal status: Met 08/19/22     LONG TERM GOALS: Target date: 11/11/2022    Patient independent with her HEP for pelvic floor and core strength to reduce leakage and pain.  Baseline:  Goal status: ongoing 08/19/22   2.  Patient reports she is able to walk to the bathroom and not leak stool due to the ability to contract the anus and increased endurance.  Baseline:  Goal status: ongoing 08/19/22   3.  Patient able to walk to the bathroom without leaking urine due to improved pelvic floor coordination and holding a contraction >/= 10 sec.  Baseline:  Goal status: Met 08/05/2022   4.  Patient reports she is able to pass gas without leaking stool due to improved pelvic floor strength.  Baseline:  Goal status: Met 09/10/22  5.  Patient reports her pain from the back to the abdomen decreased </= 2/10 due to improved lumbar mobility and reduction of restrictions of the lower abdomen.  Baseline:  Goal status: ongoing 08/19/22   PLAN: PT FREQUENCY: 1x/week   PT DURATION: 12 weeks   PLANNED INTERVENTIONS: Therapeutic exercises, Therapeutic activity, Neuromuscular re-education, Patient/Family education, Self Care, Joint mobilization, Dry Needling, Electrical stimulation, Spinal mobilization, Cryotherapy, Moist heat, scar mobilization, Taping, Biofeedback, and Manual therapy   PLAN FOR NEXT SESSION: continue with abdominal work, trunk stretch to assist with constipation  Earlie Counts, PT 09/23/22 3:32 PM

## 2022-09-28 ENCOUNTER — Telehealth: Payer: Self-pay | Admitting: Physical Therapy

## 2022-09-28 ENCOUNTER — Ambulatory Visit: Payer: 59 | Admitting: Physical Therapy

## 2022-09-28 NOTE — Telephone Encounter (Signed)
Called patient about her missed appointment today at 14:00. Left a message.  Earlie Counts, PT '@1'$ /29/24@ 2:29 PM

## 2022-10-07 ENCOUNTER — Ambulatory Visit: Payer: 59 | Attending: Family Medicine | Admitting: Physical Therapy

## 2022-10-07 ENCOUNTER — Encounter: Payer: Self-pay | Admitting: Physical Therapy

## 2022-10-07 DIAGNOSIS — R278 Other lack of coordination: Secondary | ICD-10-CM | POA: Diagnosis present

## 2022-10-07 DIAGNOSIS — M6281 Muscle weakness (generalized): Secondary | ICD-10-CM | POA: Diagnosis present

## 2022-10-07 NOTE — Therapy (Signed)
OUTPATIENT PHYSICAL THERAPY TREATMENT NOTE   Patient Name: Gail Crawford MRN: [id number] DOB:27-Apr-1956, 67 y.o., female Today's Date: 10/07/2022  PCP: Chesley Noon, MD   REFERRING PROVIDER: Briscoe Deutscher, DO    END OF SESSION:   PT End of Session - 10/07/22 1449     Visit Number 10    Date for PT Re-Evaluation 11/26/22    Authorization Type UHC    PT Start Time 9562    PT Stop Time 1308    PT Time Calculation (min) 40 min    Activity Tolerance Patient tolerated treatment well    Behavior During Therapy [facility] for tasks assessed/performed             Past Medical History:  Diagnosis Date   Asthma 01-2004   Back pain    Carcinoma of fallopian tube, 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   Fallopian tube cancer, 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    HTN (hypertension)    Joint pain    Leg edema    Peripheral neuropathy 01/15/2012   Past Surgical History:  Procedure Laterality Date   ABDOMINAL HYSTERECTOMY  07-15-04   TYPE II RADICAL HYSTERECTOMY WITH BSO   CESAREAN SECTION  1978   MASS EXCISION Left 06/11/2015   Procedure: EXCISION LEFT ABDOMINAL WALL MASS 6X3CM;  Surgeon: Stark Klein, MD;  Location: [facility];  Service: General;  Laterality: Left;   ORTHOPEDIC SURGERY     RIGHT SHOULDER AND ELBOW AND RIGHT FOOT   Patient Active Problem List   Diagnosis Date Noted   Metabolic syndrome 65/78/4696   Primary osteoarthritis of right knee 01/30/2020   Myalgia due to statin 12/13/2019   Gastroesophageal reflux disease without esophagitis 08/30/2019   Hyperlipidemia associated with type 2 diabetes mellitus ([place]), goal < 70, with statin-induced myalgias, Lipid Clinic has been offered  06/14/2019   Vitamin D deficiency 10/24/2018   Type 2 diabetes mellitus ([place]), Rx Trulicity 29/52/8413   Hypertension associated with diabetes ([place]), Rx Losartan and Chlorthalidone 10/06/2018   Hypertensive cardiomegaly 09/12/2018   REFERRING DIAG: R15.9 (ICD-10-CM) -  Fecal incontinence   THERAPY DIAG:  Muscle weakness (generalized)   Other lack of coordination   Rationale for Evaluation and Treatment Rehabilitation   ONSET DATE: 2021   SUBJECTIVE:                                                                                                                                                                               SUBJECTIVE STATEMENT: No abdominal pain. Stool working good. Marland Kitchen      PAIN:  Are you having pain? no NPRS scale: 0/10 Pain location:  back to the lower abdomen  PRECAUTIONS: Other: cancer   WEIGHT BEARING RESTRICTIONS No   FALLS:  Has patient fallen in last 6 months? No   LIVING ENVIRONMENT: Lives with: lives with their family   OCCUPATION: work in [employer]   PLOF: [place] reduce leakage and pain, hold her urine better.    PERTINENT HISTORY:  Abdominal hysterectomy 07/15/04; C-section 1978; Carcinoma fallopian tube 07/15/04; peripheral neuropathy   BOWEL MOVEMENT Pain with bowel movement: No buring Type of bowel movement:Type (Bristol Stool Scale) Type 4 and type 6 at the end, Frequency 1-3 days, and Strain very rarely Fully empty rectum: No Leakage: No Pads: No, keeps one in pocketbook, in locker at work and in car Fiber supplement: No   URINATION Pain with urination: No Fully empty bladder: No Stream: Strong and strong at night and during the day is average  Urgency: No Frequency: night time void every 2 hours; daytime every 3 hours Leakage: None   INTERCOURSE Pain with intercourse:  gets urge to go to the bathroom, pain with deeper penetration Ability to have vaginal penetration:  Yes:   Climax: sometimes Marinoff Scale: 1/3   PREGNANCY Vaginal deliveries 1 C-section deliveries 1 Currently pregnant No        OBJECTIVE: (objective measures completed at initial evaluation unless otherwise dated)     DIAGNOSTIC FINDINGS:  none   COGNITION:            Overall  cognitive status: Within functional limits for tasks assessed                          SENSATION:            Light touch: Appears intact            Proprioception: Appears intact     LUMBAR SPECIAL TESTS:  none   FUNCTIONAL TESTS:  none                    POSTURE: No Significant postural limitations               PELVIC ALIGNMENT:   LUMBARAROM/PROM   A/PROM A/PROM  eval 08/19/22 09/10/22  Flexion full full full  Extension Decreased by 25% Decreased by 25% Decreased by 25%  Right lateral flexion Decreased by 25% Decreased by 25% full  Left lateral flexion Decreased by 25% Decreased by 25% full  Right rotation Decreased by 25% Decreased by 25% full  Left rotation Decreased by 25% Decreased by 25% full   (Blank rows = not tested)   LOWER EXTREMITY ROM:   Passive ROM Right eval Left eval Left 09/10/22  Hip internal rotation '5 5 20  '$ Hip external rotation   40 65   (Blank rows = not tested)   LOWER EXTREMITY MMT:   MMT Right eval right 08/19/22 Left 10/07/22  Hip extension 4/5 4/5  5/5  Hip abduction 4/5 4/5  5/5     PALPATION:   General  abdominal scar has decreased movement; tenderness located in the left lower quadrant. Rib angle greater than 90, left gluteus medius, decreased movement of L1-L5, left gluteus medius, left quadratus                 External Perineal Exam intact                             Internal Pelvic Floor tenderness along the  left obturator internist, right iliococcygeus; decreased mobility of the posterior vaginal canal and puborectalis       PELVIC MMT:   MMT eval 07/29/2022 08/12/2022  Vaginal 3/5 3/5    Internal Anal Sphincter     3/5  External Anal Sphincter     3/5  Puborectalis     3/5  (Blank rows = not tested)         TONE: increased   PROLAPSE: none   TODAY'S TREATMENT  10/07/22 Manual: Soft tissue mobilization: Deep soft tissue work in the left lumbar and along the left SI joint Myofascial release: Using the  suction cup to the back to mobilize the tissue Exercises: Stretches/mobility: Lumbar rotation bringing leg over to other side Single knee to chest each side Hamstring stretch holding 30 sec each Strengthening: Nustep 6 minutes level 5 while assessing patient.   09/23/22 Manual: Soft tissue mobilization: Circular massage to abdomen to promote peristalic motion of intestines Manual work to bilateral diaphragm Manual work to left quadratus and left obliques Myofascial release: Release of the mesenteric root  Stand and lean on high mat with therapist pulling the tissue from the sides to the abdomen Lifting tissue off the suprapubic area in tall kneel leaning on high mat and patient move hips side to side and forward and back Release of the upper abdominal area   Exercises: Stretches/mobility: Supine trunk rotation hold 30 sec bil.  Cat camel 10x leaning on mat in standing Childs pose in standing with arms on high mat Strengthening: Nustep 6 minutes level 5 while assessing patient.       09/10/22 Manual: Soft tissue mobilization: Circular massage of the abdomen to improve peristalic motion of the intestines Manual work to the diaphragm Myofascial release: Fascial release along the left abdominal area and lumbar in right sidely Fascial release along the lateral side of the left rectus Fascial release of the mesenteric root and inguinal area Exercises: Stretches/mobility: Strengthening: Squats 10x Standing hip external rotation 10x each side       PATIENT EDUCATION:  07/20/2022 Education details: Access Code: [place]; educated patient on scar massage, abdominal massage, and toileting Person educated: Patient Education method: Explanation, Demonstration, Tactile cues, Verbal cues, and Handouts Education comprehension: verbalized understanding, returned demonstration, verbal cues required, tactile cues required, and needs further education     HOME EXERCISE  PROGRAM: 07/20/2022 Access Code: [email or web address] URL: [email or web address] Date: 06/10/2022 Prepared by: Earlie Counts   Exercises - Seated Pelvic Floor Contraction  - 3 x daily - 7 x weekly - 1 sets - 10 reps - 5 sec hold   ASSESSMENT:   CLINICAL IMPRESSION: Patient is a 67 y.o. female who was seen today for physical therapy  treatment for fecal incontinence. Patient is able to walk to the bathroom without leaking stool or urine. She is able to pass gas without leaking stool. Patient is not having abdominal pain. Pelvic floor strength 3/5. Patient is independent with her HEP.     OBJECTIVE IMPAIRMENTS decreased activity tolerance, decreased coordination, decreased endurance, decreased mobility, decreased strength, increased fascial restrictions, and pain.    ACTIVITY LIMITATIONS lifting, sitting, continence, and toileting   PARTICIPATION LIMITATIONS: interpersonal relationship, community activity, and occupation   PERSONAL FACTORS Age, Fitness, and 1-2 comorbidities: Abdominal hysterectomy 07/15/04; C-section 1978; Carcinoma fallopian tube 07/15/04; peripheral neuropathy  are also affecting patient's functional outcome.    REHAB POTENTIAL: Excellent   CLINICAL DECISION MAKING: Evolving/moderate complexity   EVALUATION COMPLEXITY: Moderate     GOALS: Goals  reviewed with patient? Yes   SHORT TERM GOALS: Target date: 07/08/2022   Patient independent with scar massage.  Baseline: Goal status: Met 07/20/2022   2.  Patient is able to contract her abdominals with reduction of her rib cage angle.  Baseline:  Goal status: Met 07/29/2022   3.  Patient educated on vaginal moisturizers and lubricants to improve vaginal health.  Baseline:  Goal status: Met 07/29/2022   4.  Patient educated on urge to void  Baseline:  Goal status: Met 08/19/22     LONG TERM GOALS: Target date: 11/11/2022    Patient independent with her HEP for pelvic floor and core strength to reduce  leakage and pain.  Baseline:  Goal status: Met 10/07/22   2.  Patient reports she is able to walk to the bathroom and not leak stool due to the ability to contract the anus and increased endurance.  Baseline:  Goal status: Met 10/07/21   3.  Patient able to walk to the bathroom without leaking urine due to improved pelvic floor coordination and holding a contraction >/= 10 sec.  Baseline:  Goal status: Met 08/05/2022   4.  Patient reports she is able to pass gas without leaking stool due to improved pelvic floor strength.  Baseline:  Goal status: Met 09/10/22  5.  Patient reports her pain from the back to the abdomen decreased </= 2/10 due to improved lumbar mobility and reduction of restrictions of the lower abdomen.  Baseline:  Goal status: Met 10/07/22   PLAN:Discharge to HEP  Earlie Counts, PT 10/07/22 3:28 PM   PHYSICAL THERAPY DISCHARGE SUMMARY  Visits from Start of Care: 10  Current functional level related to goals / functional outcomes: See above.    Remaining deficits: See above.    Education / Equipment: HEP   Patient agrees to discharge. Patient goals were met. Patient is being discharged due to meeting the stated rehab goals. Thank you for the referral.   Earlie Counts, PT 10/07/22 3:28 PM

## 2024-03-07 NOTE — H&P (Signed)
 Surgical History & Physical  Patient Name: Gail Crawford  DOB: 16-Jul-1956  Surgery: Cataract extraction with intraocular lens implant phacoemulsification; Right Eye Surgeon: Lynwood Hermann MD Surgery Date: 03/07/2024 Pre-Op Date: 01/27/2024  HPI: A 21 Yr. old female patient 1.  The patient is here for a cataract evaluation Ref: Dr. Darroll. Pt. complains of difficulty when viewing TV, reading closed caption, news scrolls on TV. Both eyes are affected. The episode is constant. Symptoms occur when the patient is driving and reading. The complaint is associated with blurry vision. This is negatively affecting the patient's quality of life and the patient is unable to function adequately in life with the current level of vision. HPI Completed by Dr. Lynwood Hermann  Medical History: Cataracts  High Blood Pressure  Review of Systems Cardiovascular High Blood Pressure  Social Never smoked  Medication Restasis,  Losartan -hydrochlorothiazide , Montelukast, Diclofenac  sodium, Zyrtec-D, Vitamin D3 Complete  Sx/Procedures None  Drug Allergies  Sulfa (Sulfonamide Antibiotics)   History & Physical: Heent: cataract NECK: supple without bruits LUNGS: lungs clear to auscultation CV: regular rate and rhythm Abdomen: soft and non-tender  Impression & Plan: Assessment: 1.  NUCLEAR SCLEROSIS AGE RELATED; Both Eyes (H25.13) 2.  BLEPHARITIS; Right Upper Lid, Right Lower Lid, Left Upper Lid, Left Lower Lid (H01.001, H01.002,H01.004,H01.005) 3.  ASTIGMATISM, REGULAR; Both Eyes (H52.223)  Plan: 1.  Cataract accounts for the patient's decreased vision. This visual impairment is not correctable with a tolerable change in glasses or contact lenses. Cataract surgery with an implantation of a new lens should significantly improve the visual and functional status of the patient. Discussed all risks, benefits, alternatives, and potential complications. Discussed the procedures and recovery. Patient desires  to have surgery. A-scan ordered and performed today for intra-ocular lens calculations. The surgery will be performed in order to improve vision for driving, reading, and for eye examinations. Recommend phacoemulsification with intra-ocular lens. Recommend Dextenza  for post-operative pain and inflammation. Right Eye worse - first. Dilates well - shugarcaine by protocol. Consider Odyssey IOL.  2.  Blepharitis is present - recommend regular lid cleaning.  3.  Small on auto-K and Topography.

## 2024-03-09 ENCOUNTER — Encounter (HOSPITAL_COMMUNITY)
Admission: RE | Admit: 2024-03-09 | Discharge: 2024-03-09 | Disposition: A | Discharge status: Home / Self Care | Source: Ambulatory Visit | Attending: Ophthalmology | Admitting: Ophthalmology

## 2024-03-09 ENCOUNTER — Encounter (HOSPITAL_COMMUNITY): Payer: Self-pay

## 2024-03-13 ENCOUNTER — Ambulatory Visit (HOSPITAL_COMMUNITY): Admitting: Anesthesiology

## 2024-03-13 ENCOUNTER — Ambulatory Visit (HOSPITAL_COMMUNITY)
Admission: RE | Admit: 2024-03-13 | Discharge: 2024-03-13 | Disposition: A | Discharge status: Home / Self Care | Attending: Ophthalmology | Admitting: Ophthalmology

## 2024-03-13 ENCOUNTER — Encounter (HOSPITAL_COMMUNITY)
Admission: RE | Disposition: A | Discharge status: Home / Self Care | Payer: Self-pay | Source: Home / Self Care | Attending: Ophthalmology

## 2024-03-13 ENCOUNTER — Encounter (HOSPITAL_COMMUNITY): Payer: Self-pay | Admitting: Ophthalmology

## 2024-03-13 ENCOUNTER — Other Ambulatory Visit: Payer: Self-pay

## 2024-03-13 DIAGNOSIS — H2511 Age-related nuclear cataract, right eye: Secondary | ICD-10-CM | POA: Insufficient documentation

## 2024-03-13 DIAGNOSIS — E1136 Type 2 diabetes mellitus with diabetic cataract: Secondary | ICD-10-CM | POA: Insufficient documentation

## 2024-03-13 DIAGNOSIS — H01002 Unspecified blepharitis right lower eyelid: Secondary | ICD-10-CM | POA: Diagnosis not present

## 2024-03-13 DIAGNOSIS — H01005 Unspecified blepharitis left lower eyelid: Secondary | ICD-10-CM | POA: Diagnosis not present

## 2024-03-13 DIAGNOSIS — H01004 Unspecified blepharitis left upper eyelid: Secondary | ICD-10-CM | POA: Diagnosis not present

## 2024-03-13 DIAGNOSIS — E119 Type 2 diabetes mellitus without complications: Secondary | ICD-10-CM

## 2024-03-13 DIAGNOSIS — H52223 Regular astigmatism, bilateral: Secondary | ICD-10-CM | POA: Diagnosis not present

## 2024-03-13 DIAGNOSIS — J45909 Unspecified asthma, uncomplicated: Secondary | ICD-10-CM | POA: Insufficient documentation

## 2024-03-13 DIAGNOSIS — I10 Essential (primary) hypertension: Secondary | ICD-10-CM | POA: Diagnosis not present

## 2024-03-13 DIAGNOSIS — H5711 Ocular pain, right eye: Secondary | ICD-10-CM | POA: Diagnosis present

## 2024-03-13 DIAGNOSIS — H01001 Unspecified blepharitis right upper eyelid: Secondary | ICD-10-CM | POA: Diagnosis not present

## 2024-03-13 DIAGNOSIS — K219 Gastro-esophageal reflux disease without esophagitis: Secondary | ICD-10-CM | POA: Diagnosis not present

## 2024-03-13 HISTORY — PX: CATARACT EXTRACTION W/PHACO: SHX586

## 2024-03-13 HISTORY — PX: INSERTION, STENT, DRUG-ELUTING, LACRIMAL CANALICULUS: SHX7453

## 2024-03-13 SURGERY — PHACOEMULSIFICATION, CATARACT, WITH IOL INSERTION
Anesthesia: Monitor Anesthesia Care | Site: Eye | Laterality: Right

## 2024-03-13 MED ORDER — DEXAMETHASONE 0.4 MG OP INST
VAGINAL_INSERT | OPHTHALMIC | Status: AC
  Filled 2024-03-13: qty 1

## 2024-03-13 MED ORDER — MIDAZOLAM HCL 2 MG/2ML IJ SOLN
INTRAMUSCULAR | Status: AC
  Filled 2024-03-13: qty 2

## 2024-03-13 MED ORDER — SODIUM HYALURONATE 10 MG/ML IO SOLUTION
PREFILLED_SYRINGE | INTRAOCULAR | Status: DC | PRN
  Administered 2024-03-13: .85 mL via INTRAOCULAR

## 2024-03-13 MED ORDER — PHENYLEPHRINE HCL 2.5 % OP SOLN
1.0000 [drp] | OPHTHALMIC | Status: AC | PRN
  Administered 2024-03-13 (×3): 1 [drp] via OPHTHALMIC

## 2024-03-13 MED ORDER — POVIDONE-IODINE 5 % OP SOLN
OPHTHALMIC | Status: DC | PRN
  Administered 2024-03-13: 1 via OPHTHALMIC

## 2024-03-13 MED ORDER — SODIUM CHLORIDE 0.9% FLUSH
INTRAVENOUS | Status: DC | PRN
  Administered 2024-03-13: 5 mL via INTRAVENOUS

## 2024-03-13 MED ORDER — TETRACAINE HCL 0.5 % OP SOLN
1.0000 [drp] | OPHTHALMIC | Status: AC | PRN
  Administered 2024-03-13 (×3): 1 [drp] via OPHTHALMIC

## 2024-03-13 MED ORDER — BSS IO SOLN
INTRAOCULAR | Status: DC | PRN
  Administered 2024-03-13: 15 mL via INTRAOCULAR

## 2024-03-13 MED ORDER — SODIUM HYALURONATE 23MG/ML IO SOSY
PREFILLED_SYRINGE | INTRAOCULAR | Status: DC | PRN
  Administered 2024-03-13: .6 mL via INTRAOCULAR

## 2024-03-13 MED ORDER — DEXAMETHASONE 0.4 MG OP INST
VAGINAL_INSERT | OPHTHALMIC | Status: DC | PRN
  Administered 2024-03-13: .4 mg via OPHTHALMIC

## 2024-03-13 MED ORDER — LACTATED RINGERS IV SOLN: INTRAVENOUS | Status: DC

## 2024-03-13 MED ORDER — STERILE WATER FOR IRRIGATION IR SOLN
Status: DC | PRN
  Administered 2024-03-13: 1

## 2024-03-13 MED ORDER — MOXIFLOXACIN HCL 5 MG/ML IO SOLN
INTRAOCULAR | Status: DC | PRN
  Administered 2024-03-13: .2 mL via INTRACAMERAL

## 2024-03-13 MED ORDER — TROPICAMIDE 1 % OP SOLN
1.0000 [drp] | OPHTHALMIC | Status: AC | PRN
  Administered 2024-03-13 (×3): 1 [drp] via OPHTHALMIC

## 2024-03-13 MED ORDER — PHENYLEPHRINE-KETOROLAC 1-0.3 % IO SOLN
INTRAOCULAR | Status: DC | PRN
  Administered 2024-03-13: 500 mL via OPHTHALMIC

## 2024-03-13 MED ORDER — MIDAZOLAM HCL 2 MG/2ML IJ SOLN
INTRAMUSCULAR | Status: DC | PRN
  Administered 2024-03-13: 2 mg via INTRAVENOUS

## 2024-03-13 MED ORDER — LIDOCAINE HCL (PF) 1 % IJ SOLN
INTRAMUSCULAR | Status: DC | PRN
  Administered 2024-03-13: 1 mL

## 2024-03-13 MED ORDER — LIDOCAINE HCL 3.5 % OP GEL: 1.0000 | Freq: Once | OPHTHALMIC | Status: DC

## 2024-03-13 SURGICAL SUPPLY — 12 items
CATARACT SUITE SIGHTPATH (MISCELLANEOUS) ×1 IMPLANT
CLOTH BEACON ORANGE TIMEOUT ST (SAFETY) ×1 IMPLANT
EYE SHIELD UNIVERSAL CLEAR (GAUZE/BANDAGES/DRESSINGS) IMPLANT
FEE CATARACT SUITE SIGHTPATH (MISCELLANEOUS) ×1 IMPLANT
GLOVE BIOGEL PI IND STRL 7.0 (GLOVE) ×2 IMPLANT
LENS IOL TECNIS EYHANCE 23.5 (Intraocular Lens) IMPLANT
NDL HYPO 18GX1.5 BLUNT FILL (NEEDLE) ×1 IMPLANT
NEEDLE HYPO 18GX1.5 BLUNT FILL (NEEDLE) ×1 IMPLANT
PAD ARMBOARD POSITIONER FOAM (MISCELLANEOUS) ×1 IMPLANT
SYR TB 1ML LL NO SAFETY (SYRINGE) ×1 IMPLANT
TAPE SURG TRANSPORE 1 IN (GAUZE/BANDAGES/DRESSINGS) IMPLANT
WATER STERILE IRR 250ML POUR (IV SOLUTION) ×1 IMPLANT

## 2024-03-13 NOTE — Transfer of Care (Signed)
 Immediate Anesthesia Transfer of Care Note  Patient: BAANI BOBER  Procedure(s) Performed: PHACOEMULSIFICATION, CATARACT, WITH IOL INSERTION (Right: Eye) INSERTION, STENT, DRUG-ELUTING, LACRIMAL CANALICULUS (Right: Eye)  Patient Location: Short Stay  Anesthesia Type:MAC  Level of Consciousness: awake and patient cooperative  Airway & Oxygen Therapy: Patient Spontanous Breathing  Post-op Assessment: Report given to RN and Post -op Vital signs reviewed and stable  Post vital signs: Reviewed and stable  Last Vitals:  Vitals Value Taken Time  BP 157/73 03/13/24 09:31  Temp 36.9 C 03/13/24 09:31  Pulse 77 03/13/24 09:31  Resp 10 03/13/24 09:31  SpO2 99 % 03/13/24 09:31    Last Pain:  Vitals:   03/13/24 0931  TempSrc: Oral  PainSc: 0-No pain         Complications: No notable events documented.

## 2024-03-13 NOTE — Discharge Instructions (Addendum)
 Please discharge patient when stable, will follow up today with Dr. June Leap at the [facility] office immediately following discharge.  Leave shield in place until visit.  All paperwork with discharge instructions will be given at the office.  [facility] Address:  [address]  [place], [place] [address]

## 2024-03-13 NOTE — Anesthesia Preprocedure Evaluation (Addendum)
 Anesthesia Evaluation  Patient identified by MRN, date of birth, ID band Patient awake    Reviewed: Allergy & Precautions, H&P , NPO status , Patient's Chart, lab work & pertinent test results, reviewed documented beta blocker date and time   Airway Mallampati: II  TM Distance: >3 FB Neck ROM: full    Dental  (+) Dental Advisory Given, Missing, Loose,    Pulmonary asthma    Pulmonary exam normal breath sounds clear to auscultation       Cardiovascular Exercise Tolerance: Good hypertension, Normal cardiovascular exam Rhythm:regular Rate:Normal     Neuro/Psych  Neuromuscular disease  negative psych ROS   GI/Hepatic Neg liver ROS,GERD  ,,  Endo/Other  diabetes, Type 2    Renal/GU negative Renal ROS  negative genitourinary   Musculoskeletal  (+) Arthritis , Osteoarthritis,    Abdominal   Peds  Hematology negative hematology ROS (+)   Anesthesia Other Findings Fallopian tube carcinoma  Reproductive/Obstetrics negative OB ROS                              Anesthesia Physical Anesthesia Plan  ASA: 3  Anesthesia Plan: MAC   Post-op Pain Management: Minimal or no pain anticipated   Induction:   PONV Risk Score and Plan:   Airway Management Planned: Nasal Cannula and Natural Airway  Additional Equipment: None  Intra-op Plan:   Post-operative Plan:   Informed Consent: I have reviewed the patients History and Physical, chart, labs and discussed the procedure including the risks, benefits and alternatives for the proposed anesthesia with the patient or authorized representative who has indicated his/her understanding and acceptance.     Dental Advisory Given  Plan Discussed with: CRNA  Anesthesia Plan Comments:          Anesthesia Quick Evaluation

## 2024-03-13 NOTE — Anesthesia Procedure Notes (Signed)
 Date/Time: 03/13/2024 9:09 AM  Performed by: Barbarann Verneita RAMAN, CRNAPre-anesthesia Checklist: Patient identified, Emergency Drugs available, Suction available, Timeout performed and Patient being monitored Patient Re-evaluated:Patient Re-evaluated prior to induction Oxygen Delivery Method: Nasal Cannula

## 2024-03-13 NOTE — Interval H&P Note (Signed)
 History and Physical Interval Note:  03/13/2024 9:02 AM  Gail Crawford  has presented today for surgery, with the diagnosis of nuclear sclerotic cataract, right eye.  The various methods of treatment have been discussed with the patient and family. After consideration of risks, benefits and other options for treatment, the patient has consented to  Procedure(s): PHACOEMULSIFICATION, CATARACT, WITH IOL INSERTION (Right) INSERTION, STENT, DRUG-ELUTING, LACRIMAL CANALICULUS (Right) as a surgical intervention.  The patient's history has been reviewed, patient examined, no change in status, stable for surgery.  I have reviewed the patient's chart and labs.  Questions were answered to the patient's satisfaction.     HARRIE AGENT

## 2024-03-13 NOTE — Anesthesia Postprocedure Evaluation (Signed)
 Anesthesia Post Note  Patient: Gail Crawford  Procedure(s) Performed: PHACOEMULSIFICATION, CATARACT, WITH IOL INSERTION (Right: Eye) INSERTION, STENT, DRUG-ELUTING, LACRIMAL CANALICULUS (Right: Eye)  Patient location during evaluation: Phase II Anesthesia Type: MAC Level of consciousness: awake and alert Pain management: pain level controlled Vital Signs Assessment: post-procedure vital signs reviewed and stable Respiratory status: spontaneous breathing, nonlabored ventilation and respiratory function stable Cardiovascular status: stable and blood pressure returned to baseline Postop Assessment: no apparent nausea or vomiting Anesthetic complications: no   There were no known notable events for this encounter.   Last Vitals:  Vitals:   03/13/24 0756 03/13/24 0931  BP: (!) 189/86 (!) 157/73  Pulse: 73 77  Resp: 11 10  Temp: 36.8 C 36.9 C  SpO2: 99% 99%    Last Pain:  Vitals:   03/13/24 0931  TempSrc: Oral  PainSc: 0-No pain                 [name] [name]

## 2024-03-13 NOTE — Op Note (Signed)
 Date of procedure: 03/13/24  Pre-operative diagnosis:  Visually significant age-related nuclear cataract, Right Eye (H25.11)  Post-operative diagnosis:   1. Visually significant age-related nuclear cataract, Right Eye (H25.11) 2. Pain and inflammation following cataract surgery Right Eye (H57.11)  Procedure:  Removal of cataract via phacoemulsification and insertion of intra-ocular lens [employer] DIB00 +23.5D into the capsular bag of the Right Eye 2. Placement of Dextenza  insert, Right Eye  Attending surgeon: Lynwood LABOR. Clayton Jarmon, MD, MA  Anesthesia: MAC, Topical Akten  Complications: None  Estimated Blood Loss: <3mL (minimal)  Specimens: None  Implants: As above  Indications:  Visually significant age-related cataract, Right Eye  Procedure:  The patient was seen and identified in the pre-operative area. The operative eye was identified and dilated.  The operative eye was marked.  Topical anesthesia was administered to the operative eye.     The patient was then to the operative suite and placed in the supine position.  A timeout was performed confirming the patient, procedure to be performed, and all other relevant information.   The patient's face was prepped and draped in the usual fashion for intra-ocular surgery.  A lid speculum was placed into the operative eye and the surgical microscope moved into place and focused.  A superotemporal paracentesis was created using a 20 gauge paracentesis blade. Omidria  was injected into the anterior chamber. Shugarcaine was injected into the anterior chamber.  Viscoelastic was injected into the anterior chamber.  A temporal clear-corneal main wound incision was created using a 2.57mm microkeratome.  A continuous curvilinear capsulorrhexis was initiated using an irrigating cystitome and completed using capsulorrhexis forceps.  Hydrodissection and hydrodeliniation were performed.  Viscoelastic was injected into the anterior chamber.  A  phacoemulsification handpiece and a chopper as a second instrument were used to remove the nucleus and epinucleus. The irrigation/aspiration handpiece was used to remove any remaining cortical material.   The capsular bag was reinflated with viscoelastic, checked, and found to be intact.  The intraocular lens was inserted into the capsular bag.  The irrigation/aspiration handpiece was used to remove any remaining viscoelastic.  The clear corneal wound and paracentesis wounds were then hydrated and checked with Weck-Cels to be watertight. 0.1mL of moxifloxacin  was injected into the anterior chamber.  The lid-speculum was removed. The lower punctum was dilated. A Dextenza  implant was placed in the lower canaliculus without complication.  The drape was removed.  The patient's face was cleaned with a wet and dry 4x4. A clear shield was taped over the eye. The patient was taken to the post-operative care unit in good condition, having tolerated the procedure well.  Post-Op Instructions: The patient will follow up at [facility] for a same day post-operative evaluation and will receive all other orders and instructions.

## 2024-03-14 ENCOUNTER — Encounter (HOSPITAL_COMMUNITY): Payer: Self-pay | Admitting: Ophthalmology

## 2024-03-23 ENCOUNTER — Other Ambulatory Visit: Payer: Self-pay

## 2024-03-23 ENCOUNTER — Encounter (HOSPITAL_COMMUNITY)
Admission: RE | Admit: 2024-03-23 | Discharge: 2024-03-23 | Disposition: A | Discharge status: Home / Self Care | Source: Ambulatory Visit | Attending: Ophthalmology | Admitting: Ophthalmology

## 2024-03-23 ENCOUNTER — Encounter (HOSPITAL_COMMUNITY): Payer: Self-pay

## 2024-03-23 NOTE — H&P (Signed)
 Surgical History & Physical  Patient Name: Gail Crawford  DOB: 07/17/1956  Surgery: Cataract extraction with intraocular lens implant phacoemulsification; Left Eye Surgeon: Lynwood Hermann MD Surgery Date: 03/27/2024 Pre-Op Date: 03/20/2024  HPI: A 1 Yr. old female patient Returns for 1 week PO OD, preop OS. LEE 03-13-24 with Dr. Hermann. Vision is improving since sx OS. Had an orange/white bump near lacrimal gland RLL that was bigger but has decreased. Watering eyes but pt taking allergy meds and has dry eye. FBS OD. Using Restasis BID but didn't put any in OD since sx. Using Imprimis TID OD but starts BID today- drops burn. Denies eye pain, new floaters, flashes, diplopia C/O OS issues including: poor night VA, hazy/blurry VA, glare day and night, hard to see distance like signs, captions, near like forms/labels. This is negatively affecting the patient's quality of life and the patient is unable to function adequately in life with the current level of vision. HPI was performed by Lynwood Hermann .  Medical History: Cataracts PCIOL OD  High Blood Pressure  Review of Systems Cardiovascular High Blood Pressure  Social Never smoked   Medication Restasis, Imprimis,  Losartan -hydrochlorothiazide , Montelukast, Diclofenac  sodium, Zyrtec-D, Vitamin D3 Complete  Sx/Procedures Phaco c IOL OD - Dextenza   Drug Allergies  Sulfa (Sulfonamide Antibiotics)   History & Physical: Heent: cataract NECK: supple without bruits LUNGS: lungs clear to auscultation CV: regular rate and rhythm Abdomen: soft and non-tender  Impression & Plan: Assessment: 1.  CATARACT EXTRACTION STATUS; Right Eye (Z98.41) 2.  NUCLEAR SCLEROSIS AGE RELATED; Left Eye (H25.12)  Plan: 1.  1 week after cataract surgery. Doing well with improved vision and normal eye pressure. Call with any problems or concerns. Continue Pred-Moxi-Brom 2x/day for 1 more week.  2.  Cataract accounts for the patient's decreased vision.  This visual impairment is not correctable with a tolerable change in glasses or contact lenses. Cataract surgery with an implantation of a new lens should significantly improve the visual and functional status of the patient.Discussed all risks, benefits, alternatives, and potential complications. Discussed the procedures and recovery. Patient desires to have surgery. A-scan ordered and performed today for intra-ocular lens calculations. The surgery will be performed in order to improve vision for driving, reading, and for eye examinations. Recommend phacoemulsification with intra-ocular lens. Recommend Dextenza  for post-operative pain and inflammation. History of refractive Surgery: None Use of Eye Pressure Lowering Drops: none Left Eye. Surgery required to correct imbalance of vision. Dilates well - shugarcaine or Lidocaine +Omidira by protocol

## 2024-03-27 ENCOUNTER — Ambulatory Visit (HOSPITAL_COMMUNITY): Payer: Self-pay | Admitting: Anesthesiology

## 2024-03-27 ENCOUNTER — Ambulatory Visit (HOSPITAL_COMMUNITY)
Admission: RE | Admit: 2024-03-27 | Discharge: 2024-03-27 | Disposition: A | Discharge status: Home / Self Care | Attending: Ophthalmology | Admitting: Ophthalmology

## 2024-03-27 ENCOUNTER — Encounter (HOSPITAL_COMMUNITY)
Admission: RE | Disposition: A | Discharge status: Home / Self Care | Payer: Self-pay | Source: Home / Self Care | Attending: Ophthalmology

## 2024-03-27 DIAGNOSIS — H2512 Age-related nuclear cataract, left eye: Secondary | ICD-10-CM

## 2024-03-27 DIAGNOSIS — I10 Essential (primary) hypertension: Secondary | ICD-10-CM

## 2024-03-27 DIAGNOSIS — J45909 Unspecified asthma, uncomplicated: Secondary | ICD-10-CM | POA: Diagnosis not present

## 2024-03-27 DIAGNOSIS — K219 Gastro-esophageal reflux disease without esophagitis: Secondary | ICD-10-CM | POA: Diagnosis not present

## 2024-03-27 DIAGNOSIS — E119 Type 2 diabetes mellitus without complications: Secondary | ICD-10-CM | POA: Diagnosis not present

## 2024-03-27 DIAGNOSIS — H5712 Ocular pain, left eye: Secondary | ICD-10-CM | POA: Insufficient documentation

## 2024-03-27 DIAGNOSIS — E1136 Type 2 diabetes mellitus with diabetic cataract: Secondary | ICD-10-CM | POA: Insufficient documentation

## 2024-03-27 HISTORY — PX: CATARACT EXTRACTION W/PHACO: SHX586

## 2024-03-27 HISTORY — PX: INSERTION, STENT, DRUG-ELUTING, LACRIMAL CANALICULUS: SHX7453

## 2024-03-27 SURGERY — PHACOEMULSIFICATION, CATARACT, WITH IOL INSERTION
Anesthesia: Monitor Anesthesia Care | Site: Eye | Laterality: Left

## 2024-03-27 MED ORDER — DEXAMETHASONE 0.4 MG OP INST
VAGINAL_INSERT | OPHTHALMIC | Status: DC | PRN
  Administered 2024-03-27: .4 mg via OPHTHALMIC

## 2024-03-27 MED ORDER — PHENYLEPHRINE-KETOROLAC 1-0.3 % IO SOLN
INTRAOCULAR | Status: DC | PRN
  Administered 2024-03-27: 500 mL via OPHTHALMIC

## 2024-03-27 MED ORDER — SODIUM HYALURONATE 23MG/ML IO SOSY
PREFILLED_SYRINGE | INTRAOCULAR | Status: DC | PRN
Start: 2024-03-27 — End: 2024-03-27
  Administered 2024-03-27: .6 mL via INTRAOCULAR

## 2024-03-27 MED ORDER — POVIDONE-IODINE 5 % OP SOLN
OPHTHALMIC | Status: DC | PRN
  Administered 2024-03-27: 1 via OPHTHALMIC

## 2024-03-27 MED ORDER — PHENYLEPHRINE HCL 2.5 % OP SOLN
1.0000 [drp] | OPHTHALMIC | Status: AC | PRN
  Administered 2024-03-27 (×3): 1 [drp] via OPHTHALMIC

## 2024-03-27 MED ORDER — MIDAZOLAM HCL 5 MG/5ML IJ SOLN
INTRAMUSCULAR | Status: DC | PRN
  Administered 2024-03-27: 2 mg via INTRAVENOUS

## 2024-03-27 MED ORDER — LIDOCAINE HCL (PF) 1 % IJ SOLN
INTRAMUSCULAR | Status: DC | PRN
  Administered 2024-03-27: 1 mL

## 2024-03-27 MED ORDER — TROPICAMIDE 1 % OP SOLN
1.0000 [drp] | OPHTHALMIC | Status: AC | PRN
  Administered 2024-03-27 (×3): 1 [drp] via OPHTHALMIC

## 2024-03-27 MED ORDER — MIDAZOLAM HCL 2 MG/2ML IJ SOLN
INTRAMUSCULAR | Status: AC
Start: 2024-03-27 — End: 2024-03-27
  Filled 2024-03-27: qty 2

## 2024-03-27 MED ORDER — STERILE WATER FOR IRRIGATION IR SOLN
Status: DC | PRN
  Administered 2024-03-27: 1

## 2024-03-27 MED ORDER — BSS IO SOLN
INTRAOCULAR | Status: DC | PRN
  Administered 2024-03-27: 15 mL via INTRAOCULAR

## 2024-03-27 MED ORDER — LACTATED RINGERS IV SOLN: INTRAVENOUS | Status: DC

## 2024-03-27 MED ORDER — DEXAMETHASONE 0.4 MG OP INST
VAGINAL_INSERT | OPHTHALMIC | Status: AC
  Filled 2024-03-27: qty 1

## 2024-03-27 MED ORDER — MOXIFLOXACIN HCL 5 MG/ML IO SOLN
INTRAOCULAR | Status: DC | PRN
  Administered 2024-03-27: .2 mL via INTRACAMERAL

## 2024-03-27 MED ORDER — TETRACAINE HCL 0.5 % OP SOLN
1.0000 [drp] | OPHTHALMIC | Status: AC | PRN
  Administered 2024-03-27 (×3): 1 [drp] via OPHTHALMIC

## 2024-03-27 MED ORDER — LIDOCAINE HCL 3.5 % OP GEL
1.0000 | Freq: Once | OPHTHALMIC | Status: AC
  Administered 2024-03-27: 1 via OPHTHALMIC

## 2024-03-27 MED ORDER — SODIUM HYALURONATE 10 MG/ML IO SOLUTION
PREFILLED_SYRINGE | INTRAOCULAR | Status: DC | PRN
Start: 2024-03-27 — End: 2024-03-27
  Administered 2024-03-27: .85 mL via INTRAOCULAR

## 2024-03-27 SURGICAL SUPPLY — 12 items
CATARACT SUITE SIGHTPATH (MISCELLANEOUS) ×1 IMPLANT
CLOTH BEACON ORANGE TIMEOUT ST (SAFETY) ×1 IMPLANT
EYE SHIELD UNIVERSAL CLEAR (GAUZE/BANDAGES/DRESSINGS) IMPLANT
FEE CATARACT SUITE SIGHTPATH (MISCELLANEOUS) ×1 IMPLANT
GLOVE BIOGEL PI IND STRL 7.0 (GLOVE) ×2 IMPLANT
LENS IOL TECNIS EYHANCE 23.0 (Intraocular Lens) IMPLANT
NDL HYPO 18GX1.5 BLUNT FILL (NEEDLE) ×1 IMPLANT
NEEDLE HYPO 18GX1.5 BLUNT FILL (NEEDLE) ×1 IMPLANT
PAD ARMBOARD POSITIONER FOAM (MISCELLANEOUS) ×1 IMPLANT
SYR TB 1ML LL NO SAFETY (SYRINGE) ×1 IMPLANT
TAPE SURG TRANSPORE 1 IN (GAUZE/BANDAGES/DRESSINGS) IMPLANT
WATER STERILE IRR 250ML POUR (IV SOLUTION) ×1 IMPLANT

## 2024-03-27 NOTE — Interval H&P Note (Signed)
 History and Physical Interval Note:  03/27/2024 11:38 AM  Gail Crawford  has presented today for surgery, with the diagnosis of nuclear sclerotic cataract, left eye.  The various methods of treatment have been discussed with the patient and family. After consideration of risks, benefits and other options for treatment, the patient has consented to  Procedure(s) with comments: PHACOEMULSIFICATION, CATARACT, WITH IOL INSERTION (Left) - CDE: INSERTION, STENT, DRUG-ELUTING, LACRIMAL CANALICULUS (Left) as a surgical intervention.  The patient's history has been reviewed, patient examined, no change in status, stable for surgery.  I have reviewed the patient's chart and labs.  Questions were answered to the patient's satisfaction.     HARRIE AGENT

## 2024-03-27 NOTE — Discharge Instructions (Addendum)
 Please discharge patient when stable, will follow up today with Dr. June Leap at the [facility] office immediately following discharge.  Leave shield in place until visit.  All paperwork with discharge instructions will be given at the office.  [facility] Address:  [address]  [place], [place] [address]

## 2024-03-27 NOTE — Op Note (Signed)
 Date of procedure: 03/27/24  Pre-operative diagnosis:  Visually significant age-related nuclear cataract, Left Eye (H25.12)  Post-operative diagnosis:   1. Visually significant age-related nuclear cataract, Left Eye (H25.12) 2. Pain and inflammation following cataract surgery Left Eye (H57.12)  Procedure:  Removal of cataract via phacoemulsification and insertion of intra-ocular lens Johnson and Johnson DIB00 +23.0D into the capsular bag of the Left Eye 2. Placement of Dextenza  insert, Left Eye  Attending surgeon: Lynwood LABOR. Tyreek Clabo, MD, MA  Anesthesia: MAC, Topical Akten   Complications: None  Estimated Blood Loss: <41mL (minimal)  Specimens: None  Implants: As above  Indications:  Visually significant age-related cataract, Left Eye  Procedure:  The patient was seen and identified in the pre-operative area. The operative eye was identified and dilated.  The operative eye was marked.  Topical anesthesia was administered to the operative eye.     The patient was then to the operative suite and placed in the supine position.  A timeout was performed confirming the patient, procedure to be performed, and all other relevant information.   The patient's face was prepped and draped in the usual fashion for intra-ocular surgery.  A lid speculum was placed into the operative eye and the surgical microscope moved into place and focused.  An inferotemporal paracentesis was created using a 20 gauge paracentesis blade. Omidria  was injected into the anterior chamber. Shugarcaine was injected into the anterior chamber.  Viscoelastic was injected into the anterior chamber.  A temporal clear-corneal main wound incision was created using a 2.56mm microkeratome.  A continuous curvilinear capsulorrhexis was initiated using an irrigating cystitome and completed using capsulorrhexis forceps.  Hydrodissection and hydrodeliniation were performed.  Viscoelastic was injected into the anterior chamber.  A  phacoemulsification handpiece and a chopper as a second instrument were used to remove the nucleus and epinucleus. The irrigation/aspiration handpiece was used to remove any remaining cortical material.   The capsular bag was reinflated with viscoelastic, checked, and found to be intact.  The intraocular lens was inserted into the capsular bag.  The irrigation/aspiration handpiece was used to remove any remaining viscoelastic.  The clear corneal wound and paracentesis wounds were then hydrated and checked with Weck-Cels to be watertight. 0.1mL of moxifloxacin  was injected into the anterior chamber.  The lid-speculum was removed. The lower punctum was dilated. A Dextenza  implant was placed in the lower canaliculus without complication.  The drape was removed.  The patient's face was cleaned with a wet and dry 4x4. A clear shield was taped over the eye. The patient was taken to the post-operative care unit in good condition, having tolerated the procedure well.  Post-Op Instructions: The patient will follow up at [facility] for a same day post-operative evaluation and will receive all other orders and instructions.\

## 2024-03-27 NOTE — Transfer of Care (Signed)
 Immediate Anesthesia Transfer of Care Note  Patient: Gail Crawford  Procedure(s) Performed: PHACOEMULSIFICATION, CATARACT, WITH IOL INSERTION (Left: Eye) INSERTION, STENT, DRUG-ELUTING, LACRIMAL CANALICULUS (Left: Eye)  Patient Location: PACU  Anesthesia Type:MAC  Level of Consciousness: awake and alert   Airway & Oxygen Therapy: Patient Spontanous Breathing  Post-op Assessment: Report given to RN and Post -op Vital signs reviewed and stable  Post vital signs: Reviewed and stable  Last Vitals:  Vitals Value Taken Time  BP 161/78 03/27/24 12:13  Temp 37.2 C 03/27/24 12:13  Pulse 75 03/27/24 12:13  Resp 16 03/27/24 12:13  SpO2 98 % 03/27/24 12:13    Last Pain:  Vitals:   03/27/24 1213  TempSrc: Oral  PainSc: 0-No pain         Complications: No notable events documented.

## 2024-03-27 NOTE — Anesthesia Preprocedure Evaluation (Signed)
 Anesthesia Evaluation  Patient identified by MRN, date of birth, ID band Patient awake    Reviewed: Allergy & Precautions, H&P , NPO status , Patient's Chart, lab work & pertinent test results, reviewed documented beta blocker date and time   Airway Mallampati: II  TM Distance: >3 FB Neck ROM: full    Dental  (+) Dental Advisory Given, Missing, Loose,    Pulmonary asthma    Pulmonary exam normal breath sounds clear to auscultation       Cardiovascular Exercise Tolerance: Good hypertension, Normal cardiovascular exam Rhythm:regular Rate:Normal     Neuro/Psych  Neuromuscular disease  negative psych ROS   GI/Hepatic Neg liver ROS,GERD  ,,  Endo/Other  diabetes, Type 2    Renal/GU negative Renal ROS  negative genitourinary   Musculoskeletal  (+) Arthritis , Osteoarthritis,    Abdominal   Peds  Hematology negative hematology ROS (+)   Anesthesia Other Findings Fallopian tube carcinoma  Reproductive/Obstetrics negative OB ROS                              Anesthesia Physical Anesthesia Plan  ASA: 3  Anesthesia Plan: MAC   Post-op Pain Management: Minimal or no pain anticipated   Induction:   PONV Risk Score and Plan:   Airway Management Planned: Nasal Cannula and Natural Airway  Additional Equipment: None  Intra-op Plan:   Post-operative Plan:   Informed Consent: I have reviewed the patients History and Physical, chart, labs and discussed the procedure including the risks, benefits and alternatives for the proposed anesthesia with the patient or authorized representative who has indicated his/her understanding and acceptance.     Dental Advisory Given  Plan Discussed with: CRNA  Anesthesia Plan Comments:          Anesthesia Quick Evaluation

## 2024-03-28 ENCOUNTER — Encounter (HOSPITAL_COMMUNITY): Payer: Self-pay | Admitting: Ophthalmology

## 2024-03-31 NOTE — Anesthesia Postprocedure Evaluation (Signed)
 Anesthesia Post Note  Patient: Gail Crawford  Procedure(s) Performed: PHACOEMULSIFICATION, CATARACT, WITH IOL INSERTION (Left: Eye) INSERTION, STENT, DRUG-ELUTING, LACRIMAL CANALICULUS (Left: Eye)  Patient location during evaluation: Phase II Anesthesia Type: MAC Level of consciousness: awake Pain management: pain level controlled Vital Signs Assessment: post-procedure vital signs reviewed and stable Respiratory status: spontaneous breathing and respiratory function stable Cardiovascular status: blood pressure returned to baseline and stable Postop Assessment: no headache and no apparent nausea or vomiting Anesthetic complications: no Comments: Late entry   No notable events documented.   Last Vitals:  Vitals:   03/27/24 1115 03/27/24 1213  BP: (!) 190/83 (!) 161/78  Pulse: 90 75  Resp: 18 16  Temp: 37.2 C 37.2 C  SpO2: 97% 98%    Last Pain:  Vitals:   03/27/24 1213  TempSrc: Oral  PainSc: 0-No pain                 Yvonna JINNY Bosworth
